# Patient Record
Sex: Female | Born: 1965
Health system: Southern US, Community
[De-identification: ages and names within clinical notes are randomized; demographics above are authoritative.]

## PROBLEM LIST (undated history)

## (undated) DIAGNOSIS — G43909 Migraine, unspecified, not intractable, without status migrainosus: Secondary | ICD-10-CM

## (undated) DIAGNOSIS — R569 Unspecified convulsions: Secondary | ICD-10-CM

## (undated) DIAGNOSIS — M542 Cervicalgia: Secondary | ICD-10-CM

## (undated) DIAGNOSIS — F329 Major depressive disorder, single episode, unspecified: Secondary | ICD-10-CM

## (undated) DIAGNOSIS — F32A Depression, unspecified: Secondary | ICD-10-CM

## (undated) HISTORY — PX: IMPLANTATION VAGAL NERVE STIMULATOR: SUR692

## (undated) HISTORY — DX: Major depressive disorder, single episode, unspecified: F32.9

## (undated) HISTORY — PX: WRIST FRACTURE SURGERY: SHX121

## (undated) HISTORY — DX: Depression, unspecified: F32.A

---

## 2011-06-19 DIAGNOSIS — G40209 Localization-related (focal) (partial) symptomatic epilepsy and epileptic syndromes with complex partial seizures, not intractable, without status epilepticus: Secondary | ICD-10-CM | POA: Diagnosis not present

## 2011-06-19 DIAGNOSIS — R413 Other amnesia: Secondary | ICD-10-CM | POA: Diagnosis not present

## 2011-06-19 DIAGNOSIS — G43019 Migraine without aura, intractable, without status migrainosus: Secondary | ICD-10-CM | POA: Diagnosis not present

## 2011-06-19 DIAGNOSIS — M62838 Other muscle spasm: Secondary | ICD-10-CM | POA: Diagnosis not present

## 2011-06-19 DIAGNOSIS — G40909 Epilepsy, unspecified, not intractable, without status epilepticus: Secondary | ICD-10-CM | POA: Diagnosis not present

## 2011-06-27 DIAGNOSIS — Z23 Encounter for immunization: Secondary | ICD-10-CM | POA: Diagnosis not present

## 2011-06-27 DIAGNOSIS — R634 Abnormal weight loss: Secondary | ICD-10-CM | POA: Diagnosis not present

## 2011-06-27 DIAGNOSIS — N912 Amenorrhea, unspecified: Secondary | ICD-10-CM | POA: Diagnosis not present

## 2011-06-27 DIAGNOSIS — R6882 Decreased libido: Secondary | ICD-10-CM | POA: Diagnosis not present

## 2011-06-27 DIAGNOSIS — Z1239 Encounter for other screening for malignant neoplasm of breast: Secondary | ICD-10-CM | POA: Diagnosis not present

## 2011-06-27 DIAGNOSIS — R630 Anorexia: Secondary | ICD-10-CM | POA: Diagnosis not present

## 2011-06-27 DIAGNOSIS — K219 Gastro-esophageal reflux disease without esophagitis: Secondary | ICD-10-CM | POA: Diagnosis not present

## 2011-06-27 DIAGNOSIS — E538 Deficiency of other specified B group vitamins: Secondary | ICD-10-CM | POA: Diagnosis not present

## 2011-07-25 DIAGNOSIS — E559 Vitamin D deficiency, unspecified: Secondary | ICD-10-CM | POA: Diagnosis not present

## 2011-07-25 DIAGNOSIS — Z0000 Encounter for general adult medical examination without abnormal findings: Secondary | ICD-10-CM | POA: Diagnosis not present

## 2011-07-25 DIAGNOSIS — Z79899 Other long term (current) drug therapy: Secondary | ICD-10-CM | POA: Diagnosis not present

## 2011-07-25 DIAGNOSIS — R634 Abnormal weight loss: Secondary | ICD-10-CM | POA: Diagnosis not present

## 2011-07-25 DIAGNOSIS — Z1231 Encounter for screening mammogram for malignant neoplasm of breast: Secondary | ICD-10-CM | POA: Diagnosis not present

## 2011-07-25 DIAGNOSIS — R87610 Atypical squamous cells of undetermined significance on cytologic smear of cervix (ASC-US): Secondary | ICD-10-CM | POA: Diagnosis not present

## 2011-07-25 DIAGNOSIS — Z01419 Encounter for gynecological examination (general) (routine) without abnormal findings: Secondary | ICD-10-CM | POA: Diagnosis not present

## 2011-11-07 DIAGNOSIS — G40909 Epilepsy, unspecified, not intractable, without status epilepticus: Secondary | ICD-10-CM | POA: Diagnosis not present

## 2011-11-07 DIAGNOSIS — G40209 Localization-related (focal) (partial) symptomatic epilepsy and epileptic syndromes with complex partial seizures, not intractable, without status epilepticus: Secondary | ICD-10-CM | POA: Diagnosis not present

## 2011-11-07 DIAGNOSIS — G43019 Migraine without aura, intractable, without status migrainosus: Secondary | ICD-10-CM | POA: Diagnosis not present

## 2011-11-07 DIAGNOSIS — R413 Other amnesia: Secondary | ICD-10-CM | POA: Diagnosis not present

## 2012-01-02 DIAGNOSIS — R413 Other amnesia: Secondary | ICD-10-CM | POA: Diagnosis not present

## 2012-01-02 DIAGNOSIS — G43019 Migraine without aura, intractable, without status migrainosus: Secondary | ICD-10-CM | POA: Diagnosis not present

## 2012-01-02 DIAGNOSIS — G40909 Epilepsy, unspecified, not intractable, without status epilepticus: Secondary | ICD-10-CM | POA: Diagnosis not present

## 2012-01-02 DIAGNOSIS — G40209 Localization-related (focal) (partial) symptomatic epilepsy and epileptic syndromes with complex partial seizures, not intractable, without status epilepticus: Secondary | ICD-10-CM | POA: Diagnosis not present

## 2012-04-07 DIAGNOSIS — R413 Other amnesia: Secondary | ICD-10-CM | POA: Diagnosis not present

## 2012-04-07 DIAGNOSIS — G43019 Migraine without aura, intractable, without status migrainosus: Secondary | ICD-10-CM | POA: Diagnosis not present

## 2012-04-07 DIAGNOSIS — G40909 Epilepsy, unspecified, not intractable, without status epilepticus: Secondary | ICD-10-CM | POA: Diagnosis not present

## 2012-04-07 DIAGNOSIS — G40209 Localization-related (focal) (partial) symptomatic epilepsy and epileptic syndromes with complex partial seizures, not intractable, without status epilepticus: Secondary | ICD-10-CM | POA: Diagnosis not present

## 2012-05-20 DIAGNOSIS — Z23 Encounter for immunization: Secondary | ICD-10-CM | POA: Diagnosis not present

## 2012-08-11 DIAGNOSIS — G40209 Localization-related (focal) (partial) symptomatic epilepsy and epileptic syndromes with complex partial seizures, not intractable, without status epilepticus: Secondary | ICD-10-CM | POA: Diagnosis not present

## 2012-08-11 DIAGNOSIS — G40919 Epilepsy, unspecified, intractable, without status epilepticus: Secondary | ICD-10-CM | POA: Diagnosis not present

## 2012-08-11 DIAGNOSIS — R413 Other amnesia: Secondary | ICD-10-CM | POA: Diagnosis not present

## 2012-08-11 DIAGNOSIS — G43019 Migraine without aura, intractable, without status migrainosus: Secondary | ICD-10-CM | POA: Diagnosis not present

## 2012-10-08 DIAGNOSIS — R51 Headache: Secondary | ICD-10-CM | POA: Diagnosis not present

## 2012-11-04 DIAGNOSIS — G43019 Migraine without aura, intractable, without status migrainosus: Secondary | ICD-10-CM | POA: Diagnosis not present

## 2012-11-04 DIAGNOSIS — G40209 Localization-related (focal) (partial) symptomatic epilepsy and epileptic syndromes with complex partial seizures, not intractable, without status epilepticus: Secondary | ICD-10-CM | POA: Diagnosis not present

## 2012-11-04 DIAGNOSIS — G40919 Epilepsy, unspecified, intractable, without status epilepticus: Secondary | ICD-10-CM | POA: Diagnosis not present

## 2012-12-14 DIAGNOSIS — D485 Neoplasm of uncertain behavior of skin: Secondary | ICD-10-CM | POA: Diagnosis not present

## 2013-01-01 DIAGNOSIS — D485 Neoplasm of uncertain behavior of skin: Secondary | ICD-10-CM | POA: Diagnosis not present

## 2013-02-04 DIAGNOSIS — G40919 Epilepsy, unspecified, intractable, without status epilepticus: Secondary | ICD-10-CM | POA: Diagnosis not present

## 2013-02-04 DIAGNOSIS — G43019 Migraine without aura, intractable, without status migrainosus: Secondary | ICD-10-CM | POA: Diagnosis not present

## 2013-02-04 DIAGNOSIS — R413 Other amnesia: Secondary | ICD-10-CM | POA: Diagnosis not present

## 2013-02-04 DIAGNOSIS — G40209 Localization-related (focal) (partial) symptomatic epilepsy and epileptic syndromes with complex partial seizures, not intractable, without status epilepticus: Secondary | ICD-10-CM | POA: Diagnosis not present

## 2013-03-02 DIAGNOSIS — G43909 Migraine, unspecified, not intractable, without status migrainosus: Secondary | ICD-10-CM | POA: Diagnosis not present

## 2013-03-02 DIAGNOSIS — R0989 Other specified symptoms and signs involving the circulatory and respiratory systems: Secondary | ICD-10-CM | POA: Diagnosis not present

## 2013-03-02 DIAGNOSIS — G40909 Epilepsy, unspecified, not intractable, without status epilepticus: Secondary | ICD-10-CM | POA: Diagnosis not present

## 2013-03-02 DIAGNOSIS — Z4682 Encounter for fitting and adjustment of non-vascular catheter: Secondary | ICD-10-CM | POA: Diagnosis not present

## 2013-03-02 DIAGNOSIS — M5382 Other specified dorsopathies, cervical region: Secondary | ICD-10-CM | POA: Diagnosis not present

## 2013-05-07 DIAGNOSIS — G40919 Epilepsy, unspecified, intractable, without status epilepticus: Secondary | ICD-10-CM | POA: Diagnosis not present

## 2013-05-07 DIAGNOSIS — G40209 Localization-related (focal) (partial) symptomatic epilepsy and epileptic syndromes with complex partial seizures, not intractable, without status epilepticus: Secondary | ICD-10-CM | POA: Diagnosis not present

## 2013-05-07 DIAGNOSIS — G43009 Migraine without aura, not intractable, without status migrainosus: Secondary | ICD-10-CM | POA: Diagnosis not present

## 2013-05-21 DIAGNOSIS — G40909 Epilepsy, unspecified, not intractable, without status epilepticus: Secondary | ICD-10-CM | POA: Diagnosis not present

## 2013-06-16 DIAGNOSIS — G40909 Epilepsy, unspecified, not intractable, without status epilepticus: Secondary | ICD-10-CM | POA: Diagnosis not present

## 2013-06-16 DIAGNOSIS — Z8262 Family history of osteoporosis: Secondary | ICD-10-CM | POA: Diagnosis not present

## 2013-06-16 DIAGNOSIS — Z833 Family history of diabetes mellitus: Secondary | ICD-10-CM | POA: Diagnosis not present

## 2013-06-16 DIAGNOSIS — Z83511 Family history of glaucoma: Secondary | ICD-10-CM | POA: Diagnosis not present

## 2013-06-16 DIAGNOSIS — Z87891 Personal history of nicotine dependence: Secondary | ICD-10-CM | POA: Diagnosis not present

## 2013-06-16 DIAGNOSIS — Z888 Allergy status to other drugs, medicaments and biological substances status: Secondary | ICD-10-CM | POA: Diagnosis not present

## 2013-06-16 DIAGNOSIS — K219 Gastro-esophageal reflux disease without esophagitis: Secondary | ICD-10-CM | POA: Diagnosis not present

## 2013-06-16 DIAGNOSIS — T85695A Other mechanical complication of other nervous system device, implant or graft, initial encounter: Secondary | ICD-10-CM | POA: Diagnosis not present

## 2013-06-16 DIAGNOSIS — R569 Unspecified convulsions: Secondary | ICD-10-CM | POA: Diagnosis not present

## 2013-06-16 DIAGNOSIS — Z809 Family history of malignant neoplasm, unspecified: Secondary | ICD-10-CM | POA: Diagnosis not present

## 2013-06-16 DIAGNOSIS — Z886 Allergy status to analgesic agent status: Secondary | ICD-10-CM | POA: Diagnosis not present

## 2013-06-16 DIAGNOSIS — Z8249 Family history of ischemic heart disease and other diseases of the circulatory system: Secondary | ICD-10-CM | POA: Diagnosis not present

## 2013-06-16 DIAGNOSIS — Z79899 Other long term (current) drug therapy: Secondary | ICD-10-CM | POA: Diagnosis not present

## 2013-06-16 DIAGNOSIS — G43909 Migraine, unspecified, not intractable, without status migrainosus: Secondary | ICD-10-CM | POA: Diagnosis not present

## 2013-07-06 DIAGNOSIS — R413 Other amnesia: Secondary | ICD-10-CM | POA: Diagnosis not present

## 2013-07-06 DIAGNOSIS — G40209 Localization-related (focal) (partial) symptomatic epilepsy and epileptic syndromes with complex partial seizures, not intractable, without status epilepticus: Secondary | ICD-10-CM | POA: Diagnosis not present

## 2013-07-06 DIAGNOSIS — G43019 Migraine without aura, intractable, without status migrainosus: Secondary | ICD-10-CM | POA: Diagnosis not present

## 2013-07-06 DIAGNOSIS — G40919 Epilepsy, unspecified, intractable, without status epilepticus: Secondary | ICD-10-CM | POA: Diagnosis not present

## 2013-10-05 DIAGNOSIS — G43019 Migraine without aura, intractable, without status migrainosus: Secondary | ICD-10-CM | POA: Diagnosis not present

## 2013-10-05 DIAGNOSIS — G40919 Epilepsy, unspecified, intractable, without status epilepticus: Secondary | ICD-10-CM | POA: Diagnosis not present

## 2013-10-05 DIAGNOSIS — G40209 Localization-related (focal) (partial) symptomatic epilepsy and epileptic syndromes with complex partial seizures, not intractable, without status epilepticus: Secondary | ICD-10-CM | POA: Diagnosis not present

## 2013-12-24 DIAGNOSIS — J0100 Acute maxillary sinusitis, unspecified: Secondary | ICD-10-CM | POA: Diagnosis not present

## 2013-12-31 DIAGNOSIS — G40209 Localization-related (focal) (partial) symptomatic epilepsy and epileptic syndromes with complex partial seizures, not intractable, without status epilepticus: Secondary | ICD-10-CM | POA: Diagnosis not present

## 2013-12-31 DIAGNOSIS — G40919 Epilepsy, unspecified, intractable, without status epilepticus: Secondary | ICD-10-CM | POA: Diagnosis not present

## 2013-12-31 DIAGNOSIS — G43019 Migraine without aura, intractable, without status migrainosus: Secondary | ICD-10-CM | POA: Diagnosis not present

## 2014-04-06 DIAGNOSIS — G40209 Localization-related (focal) (partial) symptomatic epilepsy and epileptic syndromes with complex partial seizures, not intractable, without status epilepticus: Secondary | ICD-10-CM | POA: Diagnosis not present

## 2014-04-06 DIAGNOSIS — G43019 Migraine without aura, intractable, without status migrainosus: Secondary | ICD-10-CM | POA: Diagnosis not present

## 2014-04-06 DIAGNOSIS — G40919 Epilepsy, unspecified, intractable, without status epilepticus: Secondary | ICD-10-CM | POA: Diagnosis not present

## 2014-07-22 DIAGNOSIS — Z79899 Other long term (current) drug therapy: Secondary | ICD-10-CM | POA: Diagnosis not present

## 2014-07-22 DIAGNOSIS — G40919 Epilepsy, unspecified, intractable, without status epilepticus: Secondary | ICD-10-CM | POA: Diagnosis not present

## 2014-07-22 DIAGNOSIS — M542 Cervicalgia: Secondary | ICD-10-CM | POA: Diagnosis not present

## 2014-07-22 DIAGNOSIS — R413 Other amnesia: Secondary | ICD-10-CM | POA: Diagnosis not present

## 2014-07-22 DIAGNOSIS — G43019 Migraine without aura, intractable, without status migrainosus: Secondary | ICD-10-CM | POA: Diagnosis not present

## 2014-07-22 DIAGNOSIS — G40209 Localization-related (focal) (partial) symptomatic epilepsy and epileptic syndromes with complex partial seizures, not intractable, without status epilepticus: Secondary | ICD-10-CM | POA: Diagnosis not present

## 2014-10-04 DIAGNOSIS — G43019 Migraine without aura, intractable, without status migrainosus: Secondary | ICD-10-CM | POA: Diagnosis not present

## 2014-10-14 DIAGNOSIS — R6882 Decreased libido: Secondary | ICD-10-CM | POA: Diagnosis not present

## 2014-10-14 DIAGNOSIS — G40309 Generalized idiopathic epilepsy and epileptic syndromes, not intractable, without status epilepticus: Secondary | ICD-10-CM | POA: Diagnosis not present

## 2014-11-15 DIAGNOSIS — G40209 Localization-related (focal) (partial) symptomatic epilepsy and epileptic syndromes with complex partial seizures, not intractable, without status epilepticus: Secondary | ICD-10-CM | POA: Diagnosis not present

## 2014-11-15 DIAGNOSIS — G43019 Migraine without aura, intractable, without status migrainosus: Secondary | ICD-10-CM | POA: Diagnosis not present

## 2014-11-15 DIAGNOSIS — G40919 Epilepsy, unspecified, intractable, without status epilepticus: Secondary | ICD-10-CM | POA: Diagnosis not present

## 2014-11-15 DIAGNOSIS — Z79899 Other long term (current) drug therapy: Secondary | ICD-10-CM | POA: Diagnosis not present

## 2014-12-09 DIAGNOSIS — F10129 Alcohol abuse with intoxication, unspecified: Secondary | ICD-10-CM | POA: Diagnosis not present

## 2014-12-09 DIAGNOSIS — F1210 Cannabis abuse, uncomplicated: Secondary | ICD-10-CM | POA: Insufficient documentation

## 2014-12-09 DIAGNOSIS — R45851 Suicidal ideations: Secondary | ICD-10-CM | POA: Diagnosis not present

## 2014-12-09 DIAGNOSIS — Z8669 Personal history of other diseases of the nervous system and sense organs: Secondary | ICD-10-CM | POA: Insufficient documentation

## 2014-12-09 DIAGNOSIS — R45850 Homicidal ideations: Secondary | ICD-10-CM | POA: Diagnosis not present

## 2014-12-09 DIAGNOSIS — F1110 Opioid abuse, uncomplicated: Secondary | ICD-10-CM | POA: Insufficient documentation

## 2014-12-09 DIAGNOSIS — Z8679 Personal history of other diseases of the circulatory system: Secondary | ICD-10-CM | POA: Insufficient documentation

## 2014-12-09 DIAGNOSIS — F1310 Sedative, hypnotic or anxiolytic abuse, uncomplicated: Secondary | ICD-10-CM | POA: Diagnosis not present

## 2014-12-10 ENCOUNTER — Encounter (HOSPITAL_COMMUNITY): Payer: Self-pay | Admitting: *Deleted

## 2014-12-10 ENCOUNTER — Encounter (HOSPITAL_COMMUNITY): Payer: Self-pay | Admitting: Emergency Medicine

## 2014-12-10 ENCOUNTER — Emergency Department (HOSPITAL_COMMUNITY)
Admission: EM | Admit: 2014-12-10 | Discharge: 2014-12-10 | Disposition: A | Discharge status: Other Acute Inpatient Hospital | Payer: Medicare Other | Attending: Emergency Medicine | Admitting: Emergency Medicine

## 2014-12-10 ENCOUNTER — Inpatient Hospital Stay (HOSPITAL_COMMUNITY)
Admission: AD | Admit: 2014-12-10 | Discharge: 2014-12-13 | DRG: 885 | Disposition: A | Discharge status: Home / Self Care | Payer: Medicare Other | Source: Intra-hospital | Attending: Psychiatry | Admitting: Psychiatry

## 2014-12-10 DIAGNOSIS — F419 Anxiety disorder, unspecified: Secondary | ICD-10-CM | POA: Diagnosis present

## 2014-12-10 DIAGNOSIS — F1290 Cannabis use, unspecified, uncomplicated: Secondary | ICD-10-CM | POA: Insufficient documentation

## 2014-12-10 DIAGNOSIS — F332 Major depressive disorder, recurrent severe without psychotic features: Principal | ICD-10-CM

## 2014-12-10 DIAGNOSIS — G4700 Insomnia, unspecified: Secondary | ICD-10-CM | POA: Diagnosis present

## 2014-12-10 DIAGNOSIS — R45851 Suicidal ideations: Secondary | ICD-10-CM | POA: Diagnosis present

## 2014-12-10 DIAGNOSIS — F10129 Alcohol abuse with intoxication, unspecified: Secondary | ICD-10-CM | POA: Diagnosis not present

## 2014-12-10 DIAGNOSIS — F322 Major depressive disorder, single episode, severe without psychotic features: Secondary | ICD-10-CM | POA: Diagnosis present

## 2014-12-10 DIAGNOSIS — R45850 Homicidal ideations: Secondary | ICD-10-CM

## 2014-12-10 DIAGNOSIS — F1020 Alcohol dependence, uncomplicated: Secondary | ICD-10-CM | POA: Insufficient documentation

## 2014-12-10 HISTORY — DX: Unspecified convulsions: R56.9

## 2014-12-10 HISTORY — DX: Migraine, unspecified, not intractable, without status migrainosus: G43.909

## 2014-12-10 HISTORY — DX: Cervicalgia: M54.2

## 2014-12-10 LAB — COMPREHENSIVE METABOLIC PANEL
ALT: 11 U/L — ABNORMAL LOW (ref 14–54)
ANION GAP: 10 (ref 5–15)
AST: 17 U/L (ref 15–41)
Albumin: 4 g/dL (ref 3.5–5.0)
Alkaline Phosphatase: 60 U/L (ref 38–126)
BUN: 17 mg/dL (ref 6–20)
CALCIUM: 9 mg/dL (ref 8.9–10.3)
CO2: 19 mmol/L — ABNORMAL LOW (ref 22–32)
Chloride: 113 mmol/L — ABNORMAL HIGH (ref 101–111)
Creatinine, Ser: 1.04 mg/dL — ABNORMAL HIGH (ref 0.44–1.00)
GFR calc Af Amer: >60 mL/min (ref >60)
GLUCOSE: 93 mg/dL (ref 65–99)
POTASSIUM: 3.4 mmol/L — AB (ref 3.5–5.1)
Sodium: 142 mmol/L (ref 135–145)
TOTAL PROTEIN: 6.7 g/dL (ref 6.5–8.1)
Total Bilirubin: 0.1 mg/dL — ABNORMAL LOW (ref 0.3–1.2)

## 2014-12-10 LAB — TSH: TSH: 3.619 u[IU]/mL (ref 0.350–4.500)

## 2014-12-10 LAB — CBC
HCT: 38.7 % (ref 36.0–46.0)
Hemoglobin: 12.9 g/dL (ref 12.0–15.0)
MCH: 35.4 pg — AB (ref 26.0–34.0)
MCHC: 33.3 g/dL (ref 30.0–36.0)
MCV: 106.3 fL — AB (ref 78.0–100.0)
Platelets: 239 10*3/uL (ref 150–400)
RBC: 3.64 MIL/uL — ABNORMAL LOW (ref 3.87–5.11)
RDW: 12.7 % (ref 11.5–15.5)
WBC: 5.6 10*3/uL (ref 4.0–10.5)

## 2014-12-10 LAB — ACETAMINOPHEN LEVEL: ACETAMINOPHEN (TYLENOL), SERUM: 11 ug/mL (ref 10–30)

## 2014-12-10 LAB — SALICYLATE LEVEL: Salicylate Lvl: <4 mg/dL (ref 2.8–30.0)

## 2014-12-10 LAB — RAPID URINE DRUG SCREEN, HOSP PERFORMED
Amphetamines: NOT DETECTED
Barbiturates: NOT DETECTED
Benzodiazepines: POSITIVE — AB
COCAINE: NOT DETECTED
Opiates: POSITIVE — AB
TETRAHYDROCANNABINOL: POSITIVE — AB

## 2014-12-10 LAB — VALPROIC ACID LEVEL: Valproic Acid Lvl: 35 ug/mL — ABNORMAL LOW (ref 50.0–100.0)

## 2014-12-10 LAB — ETHANOL: Alcohol, Ethyl (B): 114 mg/dL — ABNORMAL HIGH (ref <5)

## 2014-12-10 MED ORDER — DIVALPROEX SODIUM 500 MG PO DR TAB
500.0000 mg | DELAYED_RELEASE_TABLET | Freq: Two times a day (BID) | ORAL | Status: DC
  Administered 2014-12-10 – 2014-12-13 (×6): 500 mg via ORAL
  Filled 2014-12-10 (×4): qty 1
  Filled 2014-12-10 (×2): qty 6
  Filled 2014-12-10 (×6): qty 1

## 2014-12-10 MED ORDER — LOPERAMIDE HCL 2 MG PO CAPS
2.0000 mg | ORAL_CAPSULE | ORAL | Status: AC | PRN
  Administered 2014-12-10: 4 mg via ORAL
  Filled 2014-12-10: qty 2

## 2014-12-10 MED ORDER — HYDROCODONE-ACETAMINOPHEN 10-325 MG PO TABS
1.0000 | ORAL_TABLET | Freq: Four times a day (QID) | ORAL | Status: DC | PRN
  Administered 2014-12-10 – 2014-12-11 (×4): 1 via ORAL
  Filled 2014-12-10 (×4): qty 1

## 2014-12-10 MED ORDER — NONFORMULARY OR COMPOUNDED ITEM
0.5000 | Freq: Two times a day (BID) | Status: DC
  Administered 2014-12-10 – 2014-12-13 (×5): 0.5 via BUCCAL

## 2014-12-10 MED ORDER — GABAPENTIN 100 MG PO CAPS
100.0000 mg | ORAL_CAPSULE | Freq: Four times a day (QID) | ORAL | Status: DC
  Administered 2014-12-10 – 2014-12-13 (×12): 100 mg via ORAL
  Filled 2014-12-10 (×11): qty 1
  Filled 2014-12-10: qty 12
  Filled 2014-12-10: qty 1
  Filled 2014-12-10: qty 12
  Filled 2014-12-10 (×2): qty 1
  Filled 2014-12-10: qty 12
  Filled 2014-12-10 (×2): qty 1
  Filled 2014-12-10: qty 12
  Filled 2014-12-10 (×2): qty 1

## 2014-12-10 MED ORDER — PNEUMOCOCCAL VAC POLYVALENT 25 MCG/0.5ML IJ INJ
0.5000 mL | INJECTION | INTRAMUSCULAR | Status: AC
  Administered 2014-12-11: 0.5 mL via INTRAMUSCULAR

## 2014-12-10 MED ORDER — CARISOPRODOL 350 MG PO TABS: 350.0000 mg | ORAL_TABLET | Freq: Three times a day (TID) | ORAL | Status: DC | PRN

## 2014-12-10 MED ORDER — DIAZEPAM 5 MG PO TABS
10.0000 mg | ORAL_TABLET | Freq: Every day | ORAL | Status: DC
  Administered 2014-12-10 – 2014-12-12 (×3): 10 mg via ORAL
  Filled 2014-12-10 (×3): qty 2

## 2014-12-10 MED ORDER — ALUM & MAG HYDROXIDE-SIMETH 200-200-20 MG/5ML PO SUSP: 30.0000 mL | ORAL | Status: DC | PRN

## 2014-12-10 MED ORDER — TOPIRAMATE 100 MG PO TABS
200.0000 mg | ORAL_TABLET | Freq: Two times a day (BID) | ORAL | Status: DC
  Administered 2014-12-10 – 2014-12-13 (×6): 200 mg via ORAL
  Filled 2014-12-10 (×2): qty 2
  Filled 2014-12-10: qty 12
  Filled 2014-12-10 (×4): qty 2
  Filled 2014-12-10: qty 12
  Filled 2014-12-10 (×4): qty 2

## 2014-12-10 MED ORDER — CHLORDIAZEPOXIDE HCL 25 MG PO CAPS: 25.0000 mg | ORAL_CAPSULE | Freq: Four times a day (QID) | ORAL | Status: AC | PRN

## 2014-12-10 MED ORDER — METOCLOPRAMIDE HCL 10 MG PO TABS
10.0000 mg | ORAL_TABLET | Freq: Once | ORAL | Status: AC
  Administered 2014-12-10: 10 mg via ORAL
  Filled 2014-12-10: qty 1

## 2014-12-10 MED ORDER — MAGNESIUM HYDROXIDE 400 MG/5ML PO SUSP: 30.0000 mL | Freq: Every day | ORAL | Status: DC | PRN

## 2014-12-10 MED ORDER — KETOROLAC TROMETHAMINE 60 MG/2ML IM SOLN
60.0000 mg | Freq: Once | INTRAMUSCULAR | Status: AC
  Administered 2014-12-10: 60 mg via INTRAMUSCULAR
  Filled 2014-12-10: qty 2

## 2014-12-10 MED ORDER — FLUOXETINE HCL 20 MG PO CAPS
20.0000 mg | ORAL_CAPSULE | Freq: Every day | ORAL | Status: DC
  Administered 2014-12-10 – 2014-12-13 (×4): 20 mg via ORAL
  Filled 2014-12-10: qty 2
  Filled 2014-12-10: qty 3
  Filled 2014-12-10 (×6): qty 2

## 2014-12-10 MED ORDER — IBUPROFEN 800 MG PO TABS
800.0000 mg | ORAL_TABLET | Freq: Three times a day (TID) | ORAL | Status: DC | PRN
  Administered 2014-12-10 – 2014-12-13 (×3): 800 mg via ORAL
  Filled 2014-12-10 (×4): qty 1

## 2014-12-10 MED ORDER — PANTOPRAZOLE SODIUM 40 MG PO TBEC
40.0000 mg | DELAYED_RELEASE_TABLET | Freq: Every day | ORAL | Status: DC
  Administered 2014-12-10 – 2014-12-13 (×4): 40 mg via ORAL
  Filled 2014-12-10 (×8): qty 1

## 2014-12-10 MED ORDER — NONFORMULARY OR COMPOUNDED ITEM: 1.0000 | Freq: Two times a day (BID) | Status: DC
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
## 2014-12-10 NOTE — Tx Team (Signed)
Initial Interdisciplinary Treatment Plan   PATIENT STRESSORS: Health problems Marital or family conflict Substance abuse   PATIENT STRENGTHS: Average or above average intelligence Communication skills General fund of knowledge Motivation for treatment/growth Religious Affiliation Supportive family/friends   PROBLEM LIST: Problem List/Patient Goals Date to be addressed Date deferred Reason deferred Estimated date of resolution  Depression 12/10/2014     Risk for suicide 12/10/2014                                                DISCHARGE CRITERIA:  Improved stabilization in mood, thinking, and/or behavior Medical problems require only outpatient monitoring Motivation to continue treatment in a less acute level of care Need for constant or close observation no longer present Reduction of life-threatening or endangering symptoms to within safe limits Verbal commitment to aftercare and medication compliance  PRELIMINARY DISCHARGE PLAN: Return to previous living arrangement Return to previous work or school arrangements  PATIENT/FAMIILY INVOLVEMENT: This treatment plan has been presented to and reviewed with the patient, Kim [name], and/or family member, n/a.  The patient and family have been given the opportunity to ask questions and make suggestions.  Johnnye Sima, Diane C 12/10/2014, 12:01 PM
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
## 2014-12-10 NOTE — ED Provider Notes (Signed)
CSN: [id number]     Arrival date & time 12/09/14  2346 History  This chart was scribed for non-physician practitioner,working with Everlene Balls, MD, by Helane Gunther ED Scribe. This patient was seen in room A12C/A12C and the patient's care was started at 2:15 AM     Chief Complaint  Patient presents with  . Suicidal   The history is provided by the patient. No language interpreter was used.   HPI Comments: Kim [name] is a 49 y.o. female who presents to the Emergency Department complaining of suicidal ideations for the past 5 years since her father passed, worsening in the last year. She admits to thoughts of jumping out of the car on the way to the ED this evening. She notes frequent episodes of crying for several months, "eveything makes me cry." She is concerned that she may be admitted to an institution and is not forthcoming with her exact plans for self harm. She also reports homicidal ideations. She denies vomiting, diarrhea, fever, cough, rhinorrhea and change in appetite. She has not seen a psychiatrist and is not on any psychiatric medications.  She also c/o a headache. Pt has a history of migraines, notes pain today feels similar to her previous migraines.  Past Medical History  Diagnosis Date  . Migraines   . Seizures   . Neck pain    Past Surgical History  Procedure Laterality Date  . Implantation vagal nerve stimulator     No family history on file. History  Substance Use Topics  . Smoking status: Never Smoker   . Smokeless tobacco: Not on file  . Alcohol Use: Yes     Comment: daily   OB History    No data available     Review of Systems  A complete 10 system review of systems was obtained and all systems are negative except as noted in the HPI and PMH.    Allergies  Review of patient's allergies indicates no known allergies.  Home Medications   Prior to Admission medications   Not on File   BP 141/89 mmHg  Pulse 83  Temp(Src) 97.5 F (36.4 C) (Oral)   Resp 24  Ht 5\' 7"  (1.702 m)  Wt 115 lb (52.164 kg)  BMI 18.01 kg/m2  SpO2 99% Physical Exam  Constitutional: She is oriented to person, place, and time. She appears well-developed and well-nourished. No distress.  HENT:  Head: Normocephalic and atraumatic.  Nose: Nose normal.  Mouth/Throat: Oropharynx is clear and moist. No oropharyngeal exudate.  Eyes: Conjunctivae and EOM are normal. Pupils are equal, round, and reactive to light. No scleral icterus.  Neck: Normal range of motion. Neck supple. No JVD present. No tracheal deviation present. No thyromegaly present.  Cardiovascular: Normal rate, regular rhythm and normal heart sounds.  Exam reveals no gallop and no friction rub.   No murmur heard. Pulmonary/Chest: Effort normal and breath sounds normal. No respiratory distress. She has no wheezes. She exhibits no tenderness.  Abdominal: Soft. Bowel sounds are normal. She exhibits no distension and no mass. There is no tenderness. There is no rebound and no guarding.  Musculoskeletal: Normal range of motion. She exhibits no edema or tenderness.  Lymphadenopathy:    She has no cervical adenopathy.  Neurological: She is alert and oriented to person, place, and time. No cranial nerve deficit. She exhibits normal muscle tone.  Skin: Skin is warm and dry. No rash noted. No erythema. No pallor.  Psychiatric:  SI and HI  Nursing note  and vitals reviewed.   ED Course  Procedures  DIAGNOSTIC STUDIES: Oxygen Saturation is 99% on RA, normal by my interpretation.    COORDINATION OF CARE: 2:20 AM - Discussed plans to order a tele-psych consult. Pt advised of plan for treatment and pt agrees.  Labs Review Labs Reviewed  CBC - Abnormal; Notable for the following:    RBC 3.64 (*)    MCV 106.3 (*)    MCH 35.4 (*)    All other components within normal limits  COMPREHENSIVE METABOLIC PANEL - Abnormal; Notable for the following:    Potassium 3.4 (*)    Chloride 113 (*)    CO2 19 (*)     Creatinine, Ser 1.04 (*)    ALT 11 (*)    Total Bilirubin 0.1 (*)    All other components within normal limits  ETHANOL - Abnormal; Notable for the following:    Alcohol, Ethyl (B) 114 (*)    All other components within normal limits  URINE RAPID DRUG SCREEN, HOSP PERFORMED - Abnormal; Notable for the following:    Opiates POSITIVE (*)    Benzodiazepines POSITIVE (*)    Tetrahydrocannabinol POSITIVE (*)    All other components within normal limits  ACETAMINOPHEN LEVEL  SALICYLATE LEVEL    Imaging Review No results found.   EKG Interpretation None      MDM   Final diagnoses:  None   Patient presents emergency department for suicidal and homicidal ideation. She'll require psychiatric evaluation. Patient is also currently intoxicated with alcohol. She was given Toradol and Reglan for her headache which she states is a chronic migraine. TTS has been consulted.  TTS recs for inpatient criteria. She has been accepted to behavioral health. If she is not willing to sign voluntary papers, she will require IVC. I personally performed the services described in this documentation, which was scribed in my presence. The recorded information has been reviewed and is accurate.   Everlene Balls, MD 12/10/14 [phone]

## 2014-12-10 NOTE — ED Notes (Signed)
Pharmacy notified for PO reglan

## 2014-12-10 NOTE — Progress Notes (Signed)
Adult Psychoeducational Group Note  Date:  12/10/2014 Time:  8:57 PM  Group Topic/Focus:  Wrap-Up Group:   The focus of this group is to help patients review their daily goal of treatment and discuss progress on daily workbooks.  Participation Level:  Active  Participation Quality:  Appropriate  Affect:  Appropriate  Cognitive:  Appropriate  Insight: Appropriate  Engagement in Group:  Engaged  Modes of Intervention:  Discussion  Additional Comments:  The patient expressed that she attended group.The patient also said that she learned about healthy and unhealthy coping skills.  Nash Shearer 12/10/2014, 8:57 PM

## 2014-12-10 NOTE — ED Notes (Signed)
Sitter at bedside.

## 2014-12-10 NOTE — ED Notes (Signed)
Spoke with pt about signing voluntary paper work to be transferred to [facility]; pt stated that she did not want to stay and would not sign paper work; Pt started screaming at daughter that she did not tell her she had to stay; pt starts to get up as if she wanted to leave; Md notified and IVC paper work is currently in process

## 2014-12-10 NOTE — ED Notes (Signed)
Staffing office notified of need for sitter. Pt changed into purple scrubs.

## 2014-12-10 NOTE — Progress Notes (Signed)
Writer has observed patient up in the dayroom watching tv and minimal interaction with peers. Writer spoke with her 1:1 and she appears anxious during our conversation. She reports that she is glad that she is here but was mad when she came in last night. She reports that she is learning healthy coping skill and realizes that she cannot drink anymore. She is appreciative of the help that she has received since being here. She denies si/hi/a/v hallucinations. She expressed that she wants to continue to be able to see her grandchildren. Support and encouragement given, safety maintained on unit with 15 min checks.

## 2014-12-10 NOTE — ED Notes (Signed)
Awaiting IVC paperwork to be served to pt and for transport to [facility]

## 2014-12-10 NOTE — BH Assessment (Signed)
Received notification of TTS consult request. Spoke to Everlene Balls, MD who said Pt is depressed, suicidal and homicidal. Tele-assessment will be initiated.  Orpah Greek Anson Fret, [place], [facility], [facility] Triage Specialist [phone]

## 2014-12-10 NOTE — [facility] Suicide Risk Assessment (Signed)
[facility] Admission Suicide Risk Assessment   Nursing information obtained from:    Demographic factors:    Current Mental Status:    Loss Factors:    Historical Factors:    Risk Reduction Factors:    Total Time spent with patient: 1.5 hours Principal Problem: <principal problem not specified> Diagnosis:  There are no active problems to display for this patient.    Continued Clinical Symptoms:    The "Alcohol Use Disorders Identification Test", Guidelines for Use in Primary Care, Second Edition.  [employer] [facility]). Score between 0-7:  no or low risk or alcohol related problems. Score between 8-15:  moderate risk of alcohol related problems. Score between 16-19:  high risk of alcohol related problems. Score 20 or above:  warrants further diagnostic evaluation for alcohol dependence and treatment.   CLINICAL FACTORS:   Dysthymia Alcohol/Substance Abuse/Dependencies More than one psychiatric diagnosis Unstable or Poor Therapeutic Relationship   Musculoskeletal: Strength & Muscle Tone: within normal limits Gait & Station: normal Patient leans: N/A  Psychiatric Specialty Exam: Physical Exam  Nursing note and vitals reviewed. HENT:  Head: Normocephalic.  Skin: She is not diaphoretic.    Review of Systems  Constitutional: Negative.   Gastrointestinal: Negative for nausea.  Skin: Negative for rash.  Neurological: Negative for tremors and headaches.  Psychiatric/Behavioral: Positive for depression and substance abuse. The patient is nervous/anxious.     Blood pressure 115/89, pulse 76, temperature 97.7 F (36.5 C), temperature source Oral, resp. rate 16, height 5' 4.17" (1.63 m), weight 52.164 kg (115 lb), SpO2 100 %.Body mass index is 19.63 kg/(m^2).  General Appearance: Casual and Disheveled  Eye Contact::  Fair  Speech:  Slow  Volume:  Decreased  Mood:  Depressed and Dysphoric  Affect:  Congruent  Thought Process:  Coherent  Orientation:  Full (Time,  Place, and Person)  Thought Content:  Rumination  Suicidal Thoughts:  No  Homicidal Thoughts:  No  Memory:  Immediate;   Fair Recent;   Fair  Judgement:  Poor  Insight:  Shallow  Psychomotor Activity:  Decreased  Concentration:  Fair  Recall:  [place]: Fair  Akathisia:  Negative  Handed:  Right  AIMS (if indicated):     Assets:  Desire for Improvement Resilience Transportation  Sleep:     Cognition: WNL  ADL's:  Intact     COGNITIVE FEATURES THAT CONTRIBUTE TO RISK:  Closed-mindedness and Polarized thinking    SUICIDE RISK:   Moderate:  Frequent suicidal ideation with limited intensity, and duration, some specificity in terms of plans, no associated intent, good self-control, limited dysphoria/symptomatology, some risk factors present, and identifiable protective factors, including available and accessible social support.  PLAN OF CARE: inpatient stabilization and safety. Monitor alcohol withdrawals. Attend groups and medication management for depression.   Medical Decision Making:  Review of Psycho-Social Stressors (1), Review or order clinical lab tests (1), Review of Last Therapy Session (1) and Review of Medication Regimen & Side Effects (2)  I certify that inpatient services furnished can reasonably be expected to improve the patient's condition.   Damonique Brunelle 12/10/2014, 9:52 AM
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
## 2014-12-10 NOTE — ED Notes (Signed)
Pt states that she drank a fifth of [employer].

## 2014-12-10 NOTE — ED Notes (Signed)
telepsych
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
## 2014-12-11 DIAGNOSIS — F332 Major depressive disorder, recurrent severe without psychotic features: Secondary | ICD-10-CM | POA: Diagnosis not present

## 2014-12-11 LAB — LIPID PANEL
CHOL/HDL RATIO: 2 ratio
Cholesterol: 200 mg/dL (ref 0–200)
HDL: 98 mg/dL (ref >40)
LDL Cholesterol: 86 mg/dL (ref 0–99)
TRIGLYCERIDES: 82 mg/dL (ref <150)
VLDL: 16 mg/dL (ref 0–40)

## 2014-12-11 NOTE — Progress Notes (Signed)
Patient ID: Kim [name], female   DOB: [birth year], 49 y.o.   MRN: [id number] D: Patient is pleasant on approach.  She denies any depressive symptoms today.  She denies SI/HI/AVH.  Her goal today is to "get better and make myself feel better about myself."  Patient reports good sleep and appetite.  Patient received pneumonia shot and tolerated well. A: Continue to monitor medication management and MD orders. Safety checks completed every 15 minutes per protocol.  Meet 1:1 with patient to address concerns and offer encouragement. R: Patient's behavior is appropriate to situation.

## 2014-12-11 NOTE — Progress Notes (Signed)
[place] MD Progress Note  12/11/2014 2:03 PM Kim [name]  MRN:  [id number]    Subjective:  Patient states "I feel great.  I can tell that the Prozac is working already.  I can tell the difference.  I feel really good.  I slept good last night." ""I'm even telling other people you need to calm down and talk to other calmly and you might get what you need."  Objective:Patient seen face to face, chart reviewed, and discussed with staff.  Patient continues to minimize the reason why she is here not voicing the actions that caused her to be hospitalized.  Patient states that she is tolerating medication without adverse effect at this time and is attending/participating in group sessions   Principal Problem: Major depressive disorder, recurrent, severe without psychotic features Diagnosis:   Patient Active Problem List   Diagnosis Date Noted  . MDD (major depressive disorder), single episode, severe [F32.2] 12/10/2014  . Major depressive disorder, recurrent, severe without psychotic features [F33.2]   . Alcohol use disorder, moderate, dependence [F10.20]   . Marijuana use, episodic [F12.90]    Total Time spent with patient: 45 minutes   Past Medical History:  Past Medical History  Diagnosis Date  . Migraines   . Seizures   . Neck pain     Past Surgical History  Procedure Laterality Date  . Implantation vagal nerve stimulator     Family History: History reviewed. No pertinent family history. Social History:  History  Alcohol Use  . Yes    Comment: daily     History  Drug Use  . 3.00 per week  . Special: Marijuana    History   Social History  . Marital Status: Single    Spouse Name: N/A  . Number of Children: N/A  . Years of Education: N/A   Social History Main Topics  . Smoking status: Never Smoker   . Smokeless tobacco: Not on file  . Alcohol Use: Yes     Comment: daily  . Drug Use: 3.00 per week    Special: Marijuana  . Sexual Activity: Not on file   Other  Topics Concern  . None   Social History Narrative   Additional History:    Sleep: Good, States that she has to have ear plugs to sleep  Appetite:  Good   Assessment:   Musculoskeletal: Strength & Muscle Tone: within normal limits Gait & Station: normal Patient leans: N/A   Psychiatric Specialty Exam: Physical Exam  Nursing note and vitals reviewed. Constitutional: She is oriented to person, place, and time.  Neck: Normal range of motion.  Musculoskeletal: Normal range of motion.  Neurological: She is alert and oriented to person, place, and time.  Psychiatric: Her speech is normal. Her mood appears anxious. She exhibits a depressed mood.    Review of Systems  Gastrointestinal: Negative for nausea, vomiting, abdominal pain, diarrhea and constipation.  Musculoskeletal:       Chronic neck pain  Neurological: Positive for seizures and headaches. Loss of consciousness: last seizue was a couple weeks ago.       Patient states that she has not been taking her seizure medication as she should but doesn't want to let her doctor know. State that she wants to get on something for depression and that she will start taking her medications as ordered    Psychiatric/Behavioral: Positive for substance abuse (ETOH). Negative for hallucinations. Depression: Denies. Suicidal ideas: Denies. The patient is nervous/anxious. Insomnia: Denies.  Blood pressure 123/83, pulse 80, temperature 97.8 F (36.6 C), temperature source Oral, resp. rate 18, height 5' 4.17" (1.63 m), weight 52.164 kg (115 lb), SpO2 100 %.Body mass index is 19.63 kg/(m^2).  General Appearance: Casual  Eye Contact::  Good  Speech:  Blocked and Normal Rate  Volume:  Normal  Mood:  Anxious  Affect:  Congruent  Thought Process:  Irrelevant  Orientation:  Full (Time, Place, and Person)  Thought Content:  Rumination and Denies hallucinations, delusions, and paranoia  Suicidal Thoughts:  Denies.  Continues to minimize  reason in hospital"  Homicidal Thoughts:  No  Memory:  Immediate;   Good Recent;   Good Remote;   Good  Judgement:  Poor  Insight:  Lacking  Psychomotor Activity:  Normal  Concentration:  Good  Recall:  Good  Fund of Knowledge:Fair  Language: Good  Akathisia:  No  Handed:  Right  AIMS (if indicated):     Assets:  Communication Skills Housing Social Support  ADL's:  Intact  Cognition: WNL  Sleep:        Current Medications: Current Facility-Administered Medications  Medication Dose Route Frequency Provider Last Rate Last Dose  . alum & mag hydroxide-simeth (MAALOX/MYLANTA) 200-200-20 MG/5ML suspension 30 mL  30 mL Oral Q4H PRN Suprena Travaglini B Ravynn Hogate, NP      . carisoprodol (SOMA) tablet 350 mg  350 mg Oral TID PRN Camaron Cammack B Jadine Brumley, NP      . chlordiazePOXIDE (LIBRIUM) capsule 25 mg  25 mg Oral Q6H PRN Caydence Koenig B Alva Broxson, NP      . diazepam (VALIUM) tablet 10 mg  10 mg Oral QHS Thania Woodlief B Dayrin Stallone, NP   10 mg at 12/10/14 2129  . divalproex (DEPAKOTE) DR tablet 500 mg  500 mg Oral BID Angla Delahunt B Jobe Mutch, NP   500 mg at 12/11/14 0805  . FLUoxetine (PROZAC) capsule 20 mg  20 mg Oral Daily Demarrion Meiklejohn B Huel Centola, NP   20 mg at 12/11/14 0805  . gabapentin (NEURONTIN) capsule 100 mg  100 mg Oral QID Shiesha Jahn B Sameul Tagle, NP   100 mg at 12/11/14 1146  . HYDROcodone-acetaminophen (NORCO) 10-325 MG per tablet 1 tablet  1 tablet Oral Q6H PRN Evelen Vazguez B Charmel Pronovost, NP   1 tablet at 12/10/14 2149  . ibuprofen (ADVIL,MOTRIN) tablet 800 mg  800 mg Oral Q8H PRN Jefferey Lippmann B Javonna Balli, NP   800 mg at 12/10/14 2008  . loperamide (IMODIUM) capsule 2-4 mg  2-4 mg Oral PRN Jaquavius Hudler B Deboraha Goar, NP   4 mg at 12/10/14 1546  . magnesium hydroxide (MILK OF MAGNESIA) suspension 30 mL  30 mL Oral Daily PRN Charitie Hinote B Ephrata Verville, NP      . NONFORMULARY OR COMPOUNDED ITEM 0.5 lozenge  0.5 lozenge Buccal BID Jenne Campus, MD   0.5 lozenge at 12/11/14 0805  . pantoprazole (PROTONIX) EC tablet 40 mg  40 mg Oral Daily Deetya Drouillard B Tachina Spoonemore, NP   40 mg at 12/11/14  0805  . topiramate (TOPAMAX) tablet 200 mg  200 mg Oral BID Sara Selvidge B Lamae Fosco, NP   200 mg at 12/11/14 6712    Lab Results:  Results for orders placed or performed during the hospital encounter of 12/10/14 (from the past 48 hour(s))  Valproic acid level     Status: Abnormal   Collection Time: 12/10/14  7:16 PM  Result Value Ref Range   Valproic Acid Lvl 35 (L) 50.0 - 100.0 ug/mL    Comment: Performed at [facility]  Lipid panel     Status: None   Collection Time: 12/10/14  7:16 PM  Result Value Ref Range   Cholesterol 200 0 - 200 mg/dL   Triglycerides 82 <150 mg/dL   HDL 98 >40 mg/dL   Total CHOL/HDL Ratio 2.0 RATIO   VLDL 16 0 - 40 mg/dL   LDL Cholesterol 86 0 - 99 mg/dL    Comment:        Total Cholesterol/HDL:CHD Risk Coronary Heart Disease Risk Table                     Men   Women  1/2 Average Risk   3.4   3.3  Average Risk       5.0   4.4  2 X Average Risk   9.6   7.1  3 X Average Risk  23.4   11.0        Use the calculated Patient Ratio above and the CHD Risk Table to determine the patient's CHD Risk.        ATP III CLASSIFICATION (LDL):  <100     mg/dL   Optimal  100-129  mg/dL   Near or Above                    Optimal  130-159  mg/dL   Borderline  160-189  mg/dL   High  >190     mg/dL   Very High Performed at [facility]   TSH     Status: None   Collection Time: 12/10/14  7:16 PM  Result Value Ref Range   TSH 3.619 0.350 - 4.500 uIU/mL    Comment: Performed at [facility]    Physical Findings: AIMS: Facial and Oral Movements Muscles of Facial Expression: None, normal Lips and Perioral Area: None, normal Jaw: None, normal Tongue: None, normal,Extremity Movements Upper (arms, wrists, hands, fingers): None, normal Lower (legs, knees, ankles, toes): None, normal, Trunk Movements Neck, shoulders, hips: None, normal, Overall Severity Severity of abnormal movements (highest score from questions above): None,  normal Incapacitation due to abnormal movements: None, normal Patient's awareness of abnormal movements (rate only patient's report): No Awareness, Dental Status Current problems with teeth and/or dentures?: No Does patient usually wear dentures?: No  CIWA:  CIWA-Ar Total: 0 COWS:  COWS Total Score: 0  Treatment Plan Summary: Daily contact with patient to assess and evaluate symptoms and progress in treatment and Medication management  1. Admit for crisis management and stabilization 2. Medication management to reduce current symptoms to bale line and improve the patient's overall level of functioning: Continue Prozac 20 mg daily and home medications 3. Treat health problems as indicated 4. Develop treatment plan to decrease risk of relapse upon discharge and the need for readmission. 5. Psycho-social education regarding relapse prevention and self care. 6. Health care follow up as needed for medical problems 7. Restart home medications where appropriate.  Patient home medications were verified by pilled bottle fill dates and pharmacy (pain medications).  Continue current treatment plan; no changes at this time.  Medical Decision Making:  Review of Psycho-Social Stressors (1), Review of Last Therapy Session (1) and Review of Medication Regimen & Side Effects (2)   Ada Holness, FNP-BC 12/11/2014, 2:03 PM

## 2014-12-11 NOTE — Plan of Care (Signed)
Problem: Alteration in mood Goal: LTG-Patient reports reduction in suicidal thoughts (Patient reports reduction in suicidal thoughts and is able to verbalize a safety plan for whenever patient is feeling suicidal)  Outcome: Progressing Patient currently denies suicidal ideations.      
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
## 2014-12-11 NOTE — Progress Notes (Signed)
During wrap-up group when asked how pt's day was pt stated she had a great day and enjoyed being able to go outside and participate in activities such as basketball.  In addition pt stated that her 2 daughters came to visit and she enjoyed that as well.  Pt states that her goal for today was to feel better, exercise and continue to learn how to cope.  Pt states that she feels as though she accomplished that today and wants to continue once she goes home tomorrow.   Rick Duff, MHT

## 2014-12-11 NOTE — BHH Group Notes (Signed)
[place] Group Notes:  (Clinical Social Work)  12/11/2014  1:15-2:15PM  Summary of Progress/Problems:   The main focus of today's process group was to   1)  discuss the importance of adding supports  2)  define health supports versus unhealthy supports  3)  identify the patient's current unhealthy supports and plan how to handle them  4)  Identify the patient's current healthy supports and plan what to add.  An emphasis was placed on using counselor, doctor, therapy groups, 12-step groups, and problem-specific support groups to expand supports.    The patient expressed full comprehension of the concepts presented, and agreed that there is a need to add more supports.  The patient stated her daughter is a good support, and she is also planning to add exercise to her life by having her grandson's Wii hooked up and doing dance exercise.  She has a guitar she used to play and she wants to relearn it as well.  Type of Therapy:  Process Group with Motivational Interviewing  Participation Level:  Active  Participation Quality:  Attentive  Affect:  Anxious and Blunted  Cognitive:  Alert  Insight:  Improving  Engagement in Therapy:  Engaged  Modes of Intervention:   Education, Support and Processing, Activity  Selmer Dominion, LCSW 12/11/2014

## 2014-12-12 LAB — FOLATE: Folate: 6 ng/mL (ref >5.9)

## 2014-12-12 LAB — VITAMIN B12: Vitamin B-12: 322 pg/mL (ref 180–914)

## 2014-12-12 NOTE — Progress Notes (Signed)
D: Pt presents anxious on approach. Pt presented with rapid, pressured speech and flight of ideas. Pt told Probation officer that she was discharging today and plans to return home with her daughter. Pt reported that her will come and visit. Pt stated that she's not suicidal/homicidal and denies depression. Pt stated that she spoke to her daughter and plans to stop worrying about other's and focus only on herself. Pt reported to Probation officer a list of things that she plans to do to stay focus once she is discharged.. Pt compliant with taking meds and attending groups. Pt tolerating meds well and no adverse reactions to meds verbalized by pt. A: Medications administered as ordered per MD. Verbal support given. Pt encouraged to attend groups. 15 minute checks performed for safety. R: Pt receptive to treatment.

## 2014-12-12 NOTE — Progress Notes (Signed)
Patient ID: Kim [name], female   DOB: May 08, 1966, 49 y.o.   MRN: [id number] PER STATE REGULATIONS 482.30  THIS CHART WAS REVIEWED FOR MEDICAL NECESSITY WITH RESPECT TO THE PATIENT'S ADMISSION/DURATION OF STAY.  NEXT REVIEW DATE: 12/14/14  Roma Schanz, RN, BSN CASE MANAGER

## 2014-12-12 NOTE — Progress Notes (Signed)
Writer spoke with patient 1:1 and she reports having had a good day and even played some sports this afternoon outside. She had a visit by her 2 daughters this evening and reports that she is looking forward to going home on tomorrow. She plans to continue using positive coping skill and will get her Wii out and dance more once she is home. She denies si/hi/a/v hallucinations. Support and encouragement given, safety maintained on unit with 15 min checks.

## 2014-12-12 NOTE — Progress Notes (Signed)
Patient stated she thought she had a seizure in the day room after group.  She states another patient was yelling at her and she was not responding.  She denies any after affects and reported it to staff.  Was not witnessed by staff.

## 2014-12-12 NOTE — Progress Notes (Signed)
[facility] MD Progress Note  12/12/2014 8:41 PM Kim [name]  MRN:  [id number] Subjective:  Kim [name] states that she is on disability due to seizures. States she mostly stays at the house. Claims her husband accuses her of meeting other people ( men) states she cant leave the house so this accusations are very irrational. She admits she has not been able to deal with the death of her father and admits she drinks but states she shares a pint of Vodka with her husband. They both drink. Does not remember what caused the argument with her husband but things escalated. States if they would not have been drinking this would not have happened. She states she is committed to abstain on account of this event as well as her seizure disorder and the medications she takes for them Principal Problem: Major depressive disorder, recurrent, severe without psychotic features Diagnosis:   Patient Active Problem List   Diagnosis Date Noted  . MDD (major depressive disorder), single episode, severe [F32.2] 12/10/2014  . Major depressive disorder, recurrent, severe without psychotic features [F33.2]   . Alcohol use disorder, moderate, dependence [F10.20]   . Marijuana use, episodic [F12.90]    Total Time spent with patient: 30 minutes   Past Medical History:  Past Medical History  Diagnosis Date  . Migraines   . Seizures   . Neck pain     Past Surgical History  Procedure Laterality Date  . Implantation vagal nerve stimulator     Family History: History reviewed. No pertinent family history. Social History:  History  Alcohol Use  . Yes    Comment: daily     History  Drug Use  . 3.00 per week  . Special: Marijuana    History   Social History  . Marital Status: Single    Spouse Name: N/A  . Number of Children: N/A  . Years of Education: N/A   Social History Main Topics  . Smoking status: Never Smoker   . Smokeless tobacco: Not on file  . Alcohol Use: Yes     Comment: daily  . Drug Use: 3.00 per  week    Special: Marijuana  . Sexual Activity: Not on file   Other Topics Concern  . None   Social History Narrative   Additional History:    Sleep: Fair  Appetite:  Fair   Assessment:   Musculoskeletal: Strength & Muscle Tone: within normal limits Gait & Station: normal Patient leans: normal   Psychiatric Specialty Exam: Physical Exam  Review of Systems  Constitutional: Negative.   HENT: Negative.   Eyes: Negative.   Respiratory: Negative.   Cardiovascular: Negative.   Gastrointestinal: Negative.   Genitourinary: Negative.   Musculoskeletal: Negative.   Skin: Negative.   Neurological: Positive for seizures.  Psychiatric/Behavioral: Positive for depression and substance abuse. The patient is nervous/anxious.     Blood pressure 132/93, pulse 90, temperature 97.6 F (36.4 C), temperature source Oral, resp. rate 16, height 5' 4.17" (1.63 m), weight 52.164 kg (115 lb), SpO2 100 %.Body mass index is 19.63 kg/(m^2).  General Appearance: Fairly Groomed  Engineer, water::  Fair  Speech:  Clear and Coherent  Volume:  Decreased  Mood:  Anxious, Depressed and worried  Affect:  anxious sad worried  Thought Process:  Coherent and Goal Directed  Orientation:  Full (Time, Place, and Person)  Thought Content:  symptoms events worries concerns  Suicidal Thoughts:  No  Homicidal Thoughts:  No  Memory:  Immediate;   Fair Recent;  Fair Remote;   Fair  Judgement:  Fair  Insight:  Present  Psychomotor Activity:  Normal  Concentration:  Fair  Recall:  Poor  Fund of Knowledge:Fair  Language: Fair  Akathisia:  No  Handed:  Right  AIMS (if indicated):     Assets:  Desire for Improvement Housing  ADL's:  Intact  Cognition: WNL  Sleep:        Current Medications: Current Facility-Administered Medications  Medication Dose Route Frequency Provider Last Rate Last Dose  . alum & mag hydroxide-simeth (MAALOX/MYLANTA) 200-200-20 MG/5ML suspension 30 mL  30 mL Oral Q4H PRN  Shuvon B Rankin, NP      . carisoprodol (SOMA) tablet 350 mg  350 mg Oral TID PRN Shuvon B Rankin, NP      . chlordiazePOXIDE (LIBRIUM) capsule 25 mg  25 mg Oral Q6H PRN Shuvon B Rankin, NP      . diazepam (VALIUM) tablet 10 mg  10 mg Oral QHS Shuvon B Rankin, NP   10 mg at 12/11/14 2104  . divalproex (DEPAKOTE) DR tablet 500 mg  500 mg Oral BID Shuvon B Rankin, NP   500 mg at 12/12/14 1644  . FLUoxetine (PROZAC) capsule 20 mg  20 mg Oral Daily Shuvon B Rankin, NP   20 mg at 12/12/14 0808  . gabapentin (NEURONTIN) capsule 100 mg  100 mg Oral QID Shuvon B Rankin, NP   100 mg at 12/12/14 1644  . HYDROcodone-acetaminophen (NORCO) 10-325 MG per tablet 1 tablet  1 tablet Oral Q6H PRN Shuvon B Rankin, NP   1 tablet at 12/11/14 2215  . ibuprofen (ADVIL,MOTRIN) tablet 800 mg  800 mg Oral Q8H PRN Shuvon B Rankin, NP   800 mg at 12/12/14 1040  . loperamide (IMODIUM) capsule 2-4 mg  2-4 mg Oral PRN Shuvon B Rankin, NP   4 mg at 12/10/14 1546  . magnesium hydroxide (MILK OF MAGNESIA) suspension 30 mL  30 mL Oral Daily PRN Shuvon B Rankin, NP      . NONFORMULARY OR COMPOUNDED ITEM 0.5 lozenge  0.5 lozenge Buccal BID Jenne Campus, MD   0.5 lozenge at 12/12/14 1648  . pantoprazole (PROTONIX) EC tablet 40 mg  40 mg Oral Daily Shuvon B Rankin, NP   40 mg at 12/12/14 0808  . topiramate (TOPAMAX) tablet 200 mg  200 mg Oral BID Shuvon B Rankin, NP   200 mg at 12/12/14 1644    Lab Results: No results found for this or any previous visit (from the past 48 hour(s)).  Physical Findings: AIMS: Facial and Oral Movements Muscles of Facial Expression: None, normal Lips and Perioral Area: None, normal Jaw: None, normal Tongue: None, normal,Extremity Movements Upper (arms, wrists, hands, fingers): None, normal Lower (legs, knees, ankles, toes): None, normal, Trunk Movements Neck, shoulders, hips: None, normal, Overall Severity Severity of abnormal movements (highest score from questions above): None,  normal Incapacitation due to abnormal movements: None, normal Patient's awareness of abnormal movements (rate only patient's report): No Awareness, Dental Status Current problems with teeth and/or dentures?: No Does patient usually wear dentures?: No  CIWA:  CIWA-Ar Total: 0 COWS:  COWS Total Score: 0  Treatment Plan Summary: Daily contact with patient to assess and evaluate symptoms and progress in treatment and Medication management Supportive approach/coping skill Alcohol abuse; work on a relapse prevention plan Depression; will continue the Prozac and increase to 20 mg daily Seizure Disorder ;will continue the Depakote/Topamax/Neurontin Use of Valium; will discuss the additive effect of  alcohol and benzodiazapines, possible paradoxical reactions CBT/mindfulness  Medical Decision Making:  Review of Psycho-Social Stressors (1), Review of Medication Regimen & Side Effects (2) and Review of New Medication or Change in Dosage (2)     Heriberto Stmartin A 12/12/2014, 8:41 PM

## 2014-12-12 NOTE — [facility] Suicide Risk Assessment (Signed)
[facility] INPATIENT:  Family/Significant Other Suicide Prevention Education  Suicide Prevention Education:  Education Completed; Daisy Lites, Daughter, [phone];  has been identified by the patient as the family member/significant other with whom the patient will be residing, and identified as the person(s) who will aid the patient in the event of a mental health crisis (suicidal ideations/suicide attempt).  With written consent from the patient, the family member/significant other has been provided the following suicide prevention education, prior to the and/or following the discharge of the patient.  The suicide prevention education provided includes the following:  Suicide risk factors  Suicide prevention and interventions  National Suicide Hotline telephone number  [facility] assessment telephone number  [facility] Emergency Assistance [place] and/or Residential Mobile Crisis Unit telephone number  Request made of family/significant other to:  Remove weapons (e.g., guns, rifles, knives), all items previously/currently identified as safety concern.   Daughter advised patient does not have access to weapons.     Remove drugs/medications (over-the-counter, prescriptions, illicit drugs), all items previously/currently identified as a safety concern.  The family member/significant other verbalizes understanding of the suicide prevention education information provided.  The family member/significant other agrees to remove the items of safety concern listed above.  Concha Pyo 12/12/2014, 3:08 PM

## 2014-12-12 NOTE — [facility] Group Notes (Signed)
[facility] LCSW Aftercare Discharge Planning Group Note   12/12/2014 9:55 AM    Participation Quality:  Appropraite  Mood/Affect:  Appropriate  Depression Rating:  0  Anxiety Rating:  0  Thoughts of Suicide:  No  Will you contract for safety?   NA  Current AVH:  No  Plan for Discharge/Comments:  Patient attended discharge planning group and actively participated in group. She reports feeling much better today and hopes to discharge soon.  She will follow up with [facility].  Suicide prevention education reviewed and SPE document provided.   Transportation Means: Patient has transportation.   Supports:  Patient has a support system.   Aniruddh Ciavarella, Eulas Post

## 2014-12-12 NOTE — Progress Notes (Signed)
[facility] Group Notes:  (Nursing/MHT/Case Management/Adjunct)  Date:  12/12/2014  Time:  8:58 PM  Type of Therapy:  Psychoeducational Skills  Participation Level:  Active  Participation Quality:  Appropriate  Affect:  Appropriate  Cognitive:  Appropriate  Insight:  Appropriate  Engagement in Group:  Engaged  Modes of Intervention:  Discussion  Summary of Progress/Problems: Tonight in wrap up group Kim [name] said that today started a bit lower for her because she believed she had a seizure. But that it was a pretty good day overall.  [name] 12/12/2014, 8:58 PM

## 2014-12-12 NOTE — Progress Notes (Signed)
Recreation Therapy Notes  Date: 06.27.16 Time: 9:30 am Location: [address] Dayroom  Group Topic: Stress Management  Goal Area(s) Addresses:  Patient will verbalize importance of using healthy stress management.  Patient will identify positive emotions associated with healthy stress management.   Intervention: Stress Management  Activity :  Guided Automotive engineer.  LRT introduced and educated patients on stress management technique of guided imagery.  A script was used to to deliver the technique to patients.  Patients were asked to follow script read aloud by LRT to engage in the stress management technique.  Education:  Stress Management, Discharge Planning.   Clinical Observations/Feedback: Patient did not attend group.  Victorino Sparrow , LRT/CTRS         Victorino Sparrow A 12/12/2014 5:08 PM

## 2014-12-12 NOTE — [facility] Group Notes (Signed)
[facility] LCSW Group Therapy          Overcoming Obstacles       1:15 -2:30        12/12/2014       Type of Therapy:  Group Therapy  Participation Level:  Appropriate  Participation Quality:  Appropriate  Affect:  Appropriate, Alert  Cognitive:  Attentive Appropriate  Insight: Developing/Improving Engaged  Engagement in Therapy: Developing/Imprvoing Engaged  Modes of Intervention:  Discussion Exploration  Education Rapport BuildingProblem-Solving Support  Summary of Progress/Problems:  The main focus of today's group was overcoming obstacles.   She advised the obstacle she has to overcome is feelings of worthlessness.  She shared she is fortunate to have a supportive family but has never felt worthy of their love.   Kim [name] 12/12/2014

## 2014-12-13 MED ORDER — TOPIRAMATE 200 MG PO TABS: 200.0000 mg | ORAL_TABLET | Freq: Two times a day (BID) | ORAL | Status: DC

## 2014-12-13 MED ORDER — GABAPENTIN 100 MG PO CAPS: 100.0000 mg | ORAL_CAPSULE | Freq: Four times a day (QID) | ORAL | Status: DC

## 2014-12-13 MED ORDER — NEXIUM 40 MG PO CPDR: 40.0000 mg | DELAYED_RELEASE_CAPSULE | Freq: Two times a day (BID) | ORAL | Status: DC

## 2014-12-13 MED ORDER — DIAZEPAM 10 MG PO TABS: 10.0000 mg | ORAL_TABLET | Freq: Every day | ORAL | Status: DC

## 2014-12-13 MED ORDER — DIVALPROEX SODIUM 500 MG PO DR TAB: 500.0000 mg | DELAYED_RELEASE_TABLET | Freq: Two times a day (BID) | ORAL | Status: DC

## 2014-12-13 MED ORDER — CARISOPRODOL 350 MG PO TABS: 350.0000 mg | ORAL_TABLET | Freq: Three times a day (TID) | ORAL | Status: DC | PRN

## 2014-12-13 MED ORDER — HYDROCODONE-ACETAMINOPHEN 10-325 MG PO TABS: 1.0000 | ORAL_TABLET | Freq: Four times a day (QID) | ORAL | Status: DC | PRN

## 2014-12-13 MED ORDER — IBUPROFEN 800 MG PO TABS: 800.0000 mg | ORAL_TABLET | Freq: Three times a day (TID) | ORAL | Status: DC | PRN

## 2014-12-13 MED ORDER — DIAZEPAM 5 MG PO TABS: 5.0000 mg | ORAL_TABLET | Freq: Every day | ORAL | Status: DC

## 2014-12-13 MED ORDER — FLUOXETINE HCL 20 MG PO CAPS: 20.0000 mg | ORAL_CAPSULE | Freq: Every day | ORAL | Status: DC

## 2014-12-13 NOTE — Discharge Summary (Signed)
Physician Discharge Summary Note  Patient:  Kim [name] is an 49 y.o., female MRN:  [id number] DOB:  Jun 28, 1965 Patient phone:  [phone] (home)  Patient address:   [place] [address],  Total Time spent with patient: Greater than 30 minutes  Date of Admission:  12/10/2014  Date of Discharge: 12-13-14  Reason for Admission: Worsening symptoms of depression, alcohol use disorder  Principal Problem: Major depressive disorder, recurrent, severe without psychotic features  Discharge Diagnoses: Patient Active Problem List   Diagnosis Date Noted  . MDD (major depressive disorder), single episode, severe [F32.2] 12/10/2014  . Major depressive disorder, recurrent, severe without psychotic features [F33.2]   . Alcohol use disorder, moderate, dependence [F10.20]   . Marijuana use, episodic [F12.90]    Musculoskeletal: Strength & Muscle Tone: within normal limits Gait & Station: normal Patient leans: N/A  Psychiatric Specialty Exam: Physical Exam  Psychiatric: Her speech is normal and behavior is normal. Judgment and thought content normal. Her mood appears not anxious. Her affect is not angry, not blunt, not labile and not inappropriate. Cognition and memory are normal. She does not exhibit a depressed mood.    Review of Systems  Constitutional: Negative.   HENT: Negative.   Eyes: Negative.   Respiratory: Negative.   Cardiovascular: Negative.   Gastrointestinal: Negative.   Genitourinary: Negative.   Musculoskeletal: Negative.   Skin: Negative.   Neurological: Negative.   Endo/Heme/Allergies: Negative.   Psychiatric/Behavioral: Positive for depression (Stable) and substance abuse (Alcoholism, chronic). Negative for suicidal ideas, hallucinations and memory loss. The patient has insomnia (Stable). The patient is not nervous/anxious.     Blood pressure 121/86, pulse 85, temperature 98.3 F (36.8 C), temperature source Oral, resp. rate 16, height 5' 4.17" (1.63 m),  weight 52.164 kg (115 lb), SpO2 100 %.Body mass index is 19.63 kg/(m^2).  See Md's SRA   Have you used any form of tobacco in the last 30 days? (Cigarettes, Smokeless Tobacco, Cigars, and/or Pipes): No  Has this patient used any form of tobacco in the last 30 days? (Cigarettes, Smokeless Tobacco, Cigars, and/or Pipes) No  Past Medical History:  Past Medical History  Diagnosis Date  . Migraines   . Seizures   . Neck pain     Past Surgical History  Procedure Laterality Date  . Implantation vagal nerve stimulator     Family History: History reviewed. No pertinent family history.  Social History:  History  Alcohol Use  . Yes    Comment: daily     History  Drug Use  . 3.00 per week  . Special: Marijuana    History   Social History  . Marital Status: Single    Spouse Name: N/A  . Number of Children: N/A  . Years of Education: N/A   Social History Main Topics  . Smoking status: Never Smoker   . Smokeless tobacco: Not on file  . Alcohol Use: Yes     Comment: daily  . Drug Use: 3.00 per week    Special: Marijuana  . Sexual Activity: Not on file   Other Topics Concern  . None   Social History Narrative   Risk to Self: Is patient at risk for suicide?: Yes What has been your use of drugs/alcohol within the last 12 months?: Alcohol & marijuana, states will not use again because daughter states that grandson will not be allowed to come back if she drinks alcohol or somkes marijuana Risk to Others: No Prior Inpatient Therapy: No  Prior Outpatient Therapy: No  Level of Care:  OP  Hospital Course: "Me and my husband go into a argument and I know I have been drinking. I told him if you don't want, I take care of me; I kill myself; he called my daughter telling her that I was threatening to kill myself. He knew I would kill Myself. My father has died and I know if I kill myself I won't be able to join him. My daughter came and told me that I need to go get some help. She  said that I was depressed; she said she knew that I wouldn't kill my self but something was wrong because I was depressed." States that her daughter also told her that if anymore alcohol was in the house that her grandson would not be coming back. States that "I only drink about only three time out of the week (we share a pint) it not just me it's him to (referring to her husband)" "I do smoke a joint. It'll take me about one week to smoke a whole joint."  After admission assessment/evaluation, Kim [name] presenting symptoms were identified. Medication management was initiated targeting those symptoms. Her  medication regimen included; Valium 5 mg for severe anxiety, Depakote DR 500 mg for mood stabilization, Fluoxetine 20 mg for depression, gabapentin 100 mg for agitation & Topamax 200 mg for mood stabilization. She was resumed on all her pertinent home medications for her other medical issues that she presented. She tolerated her treatment regimen without any adverse effects reported. Kim [name] was also enrolled & participated in the group counseling sessions being offered & held on this unit. She learned coping skills that should help her cope better & manage her symptoms to maintain mood stability.  Kim [name]'s symptoms responded well to his treatment regimen. This is evidenced by his reports of improved mood & absence of suicidal ideations. She is currently being discharged to continue psychiatric care on outpatient basis as noted below. Kim [name] is provided with a 3 days worth, supply samples of his [facility] discharge medications. He left [facility] with all personal belongings in distress. Transportation per patient arrangement.  Consults:  psychiatry  Significant Diagnostic Studies:  labs: CBC with diff, CMP, UDS, toxicology tests, U/A, results reviewed, stable  Discharge Vitals:   Blood pressure 121/86, pulse 85, temperature 98.3 F (36.8 C), temperature source Oral, resp. rate 16, height 5' 4.17" (1.63 m), weight  52.164 kg (115 lb), SpO2 100 %. Body mass index is 19.63 kg/(m^2). Lab Results:   Results for orders placed or performed during the hospital encounter of 12/10/14 (from the past 72 hour(s))  Valproic acid level     Status: Abnormal   Collection Time: 12/10/14  7:16 PM  Result Value Ref Range   Valproic Acid Lvl 35 (L) 50.0 - 100.0 ug/mL    Comment: Performed at [facility]  Lipid panel     Status: None   Collection Time: 12/10/14  7:16 PM  Result Value Ref Range   Cholesterol 200 0 - 200 mg/dL   Triglycerides 82 <150 mg/dL   HDL 98 >40 mg/dL   Total CHOL/HDL Ratio 2.0 RATIO   VLDL 16 0 - 40 mg/dL   LDL Cholesterol 86 0 - 99 mg/dL    Comment:        Total Cholesterol/HDL:CHD Risk Coronary Heart Disease Risk Table                     Men  Women  1/2 Average Risk   3.4   3.3  Average Risk       5.0   4.4  2 X Average Risk   9.6   7.1  3 X Average Risk  23.4   11.0        Use the calculated Patient Ratio above and the CHD Risk Table to determine the patient's CHD Risk.        ATP III CLASSIFICATION (LDL):  <100     mg/dL   Optimal  100-129  mg/dL   Near or Above                    Optimal  130-159  mg/dL   Borderline  160-189  mg/dL   High  >190     mg/dL   Very High Performed at [facility]   TSH     Status: None   Collection Time: 12/10/14  7:16 PM  Result Value Ref Range   TSH 3.619 0.350 - 4.500 uIU/mL    Comment: Performed at [facility]  Vitamin B12     Status: None   Collection Time: 12/12/14  7:05 PM  Result Value Ref Range   Vitamin B-12 322 180 - 914 pg/mL    Comment: (NOTE) This assay is not validated for testing neonatal or myeloproliferative syndrome specimens for Vitamin B12 levels. Performed at [facility]   Folate     Status: None   Collection Time: 12/12/14  7:05 PM  Result Value Ref Range   Folate 6.0 >5.9 ng/mL    Comment: Performed at [facility]   Physical Findings: AIMS:  Facial and Oral Movements Muscles of Facial Expression: None, normal Lips and Perioral Area: None, normal Jaw: None, normal Tongue: None, normal,Extremity Movements Upper (arms, wrists, hands, fingers): None, normal Lower (legs, knees, ankles, toes): None, normal, Trunk Movements Neck, shoulders, hips: None, normal, Overall Severity Severity of abnormal movements (highest score from questions above): None, normal Incapacitation due to abnormal movements: None, normal Patient's awareness of abnormal movements (rate only patient's report): No Awareness, Dental Status Current problems with teeth and/or dentures?: No Does patient usually wear dentures?: No  CIWA:  CIWA-Ar Total: 0 COWS:  COWS Total Score: 0  See Psychiatric Specialty Exam and Suicide Risk Assessment completed by Attending Physician prior to discharge.  Discharge destination:  Home  Is patient on multiple antipsychotic therapies at discharge:  No   Has Patient had three or more failed trials of antipsychotic monotherapy by history:  No  Recommended Plan for Multiple Antipsychotic Therapies: NA    Medication List    STOP taking these medications        LOZENGES MT      TAKE these medications      Indication   carisoprodol 350 MG tablet  Commonly known as:  SOMA  Take 1 tablet (350 mg total) by mouth 3 (three) times daily as needed for muscle spasms.   Indication:  Musculoskeletal Pain     diazepam 10 MG tablet  Commonly known as:  VALIUM  Take 1 tablet (10 mg total) by mouth at bedtime. For severe anxiety   Indication:  Anxiety     divalproex 500 MG DR tablet  Commonly known as:  DEPAKOTE  Take 1 tablet (500 mg total) by mouth 2 (two) times daily. For mood stabilization/seizure   Indication:  Seizure/mood stabilization     FLUoxetine 20 MG capsule  Commonly known  as:  PROZAC  Take 1 capsule (20 mg total) by mouth daily. For depression   Indication:  Depression     gabapentin 100 MG capsule  Commonly  known as:  NEURONTIN  Take 1 capsule (100 mg total) by mouth 4 (four) times daily. For pain in neck down shoulder   Indication:  Pain management     HYDROcodone-acetaminophen 10-325 MG per tablet  Commonly known as:  NORCO  Take 1 tablet by mouth every 6 (six) hours as needed for moderate pain.   Indication:  Moderate to Moderately Severe Pain     ibuprofen 800 MG tablet  Commonly known as:  ADVIL,MOTRIN  Take 1 tablet (800 mg total) by mouth every 8 (eight) hours as needed for headache or mild pain.   Indication:  Pain management     NEXIUM 40 MG capsule  Generic drug:  esomeprazole  Take 1 capsule (40 mg total) by mouth 2 (two) times daily. For acid reflux   Indication:  Gastroesophageal Reflux Disease with Current Symptoms     topiramate 200 MG tablet  Commonly known as:  TOPAMAX  Take 1 tablet (200 mg total) by mouth 2 (two) times daily. For headaches/mood stabilization   Indication:  Seizure/mood stabilization       Follow-up Information    Follow up with [facility] On 12/15/2014.   Why:  You are scheduled with Dian Situ, Thursday, December 15, 2014 at 9:45 AM   Contact information:   [address] [place], [place]   [address]  [phone]     Follow-up recommendations: Activity:  As tolerated Diet: As recommended by your primary care doctor. Keep all scheduled follow-up appointments as recommended.  Comments: Take all your medications as prescribed by your mental healthcare provider. Report any adverse effects and or reactions from your medicines to your outpatient provider promptly. Patient is instructed and cautioned to not engage in alcohol and or illegal drug use while on prescription medicines. In the event of worsening symptoms, patient is instructed to call the crisis hotline, 911 and or go to the nearest ED for appropriate evaluation and treatment of symptoms. Follow-up with your primary care provider for your other medical issues, concerns and or health care  needs.   Total Discharge Time: Greater than 30 minutes  Signed: Encarnacion Slates, PMHNP, FNP-BC 12/13/2014, 4:12 PM   Patient seen, Suicide Assessment Completed.  Disposition Plan Reviewed

## 2014-12-13 NOTE — [facility] Group Notes (Signed)
Adult Psychoeducational Group Note  Date:  12/13/2014 Time:  0900am  Group Topic/Focus:  Goals Group:   The focus of this group is to help patients establish daily goals to achieve during treatment and discuss how the patient can incorporate goal setting into their daily lives to aide in recovery. Orientation:   The focus of this group is to educate the patient on the purpose and policies of crisis stabilization and provide a format to answer questions about their admission.  The group details unit policies and expectations of patients while admitted.  Participation Level:  Active  Participation Quality:  Appropriate and Attentive  Affect:  Appropriate  Cognitive:  Alert and Appropriate  Insight: Appropriate and Good  Engagement in Group:  Engaged  Modes of Intervention:  Discussion, Education and Orientation   Wynn Banker 12/13/2014, 9:36 AM

## 2014-12-13 NOTE — [facility] Suicide Risk Assessment (Signed)
[facility] Discharge Suicide Risk Assessment   Demographic Factors:  49 year female , married, lives with husband, on disability  Total Time spent with patient: 30 minutes  Musculoskeletal: Strength & Muscle Tone: within normal limits Gait & Station: normal Patient leans: N/A  Psychiatric Specialty Exam: Physical Exam  ROS  Blood pressure 121/86, pulse 85, temperature 98.3 F (36.8 C), temperature source Oral, resp. rate 16, height 5' 4.17" (1.63 m), weight 115 lb (52.164 kg), SpO2 100 %.Body mass index is 19.63 kg/(m^2).  General Appearance: improved grooming   Eye Contact::  Good  Speech:  Normal   Volume:  Normal  Mood:  Euthymic  Affect:  Appropriate  Thought Process:  Linear  Orientation:  Full (Time, Place, and Person)  Thought Content:  denies hallucinations, no delusions  Suicidal Thoughts:  No  Homicidal Thoughts:  No  Memory:  recent and remote grossly intact   Judgement:  Fair  Insight:  improved   Psychomotor Activity:  Normal  Concentration:  Good  Recall:  Good  Fund of Knowledge:NA  Language: Good  Akathisia:  Negative  Handed:  Right  AIMS (if indicated):     Assets:  Communication Skills Desire for Improvement Resilience Social Support  Sleep:     Cognition: WNL  ADL's:improved   Have you used any form of tobacco in the last 30 days? (Cigarettes, Smokeless Tobacco, Cigars, and/or Pipes): No  Has this patient used any form of tobacco in the last 30 days? (Cigarettes, Smokeless Tobacco, Cigars, and/or Pipes) No  Mental Status Per Nursing Assessment::   On Admission:  Suicidal ideation indicated by others, Suicidal ideation indicated by patient, Self-harm thoughts, Suicide plan, Plan includes specific time, place, or method  Current Mental Status by Physician: At this time patient is improved compared to admission- she is denying depression, denying any significant anxiety, and states she is feeling better, no thought disorder, denies any suicidal  ideations, denies any homicidal ideations, no psychotic symptoms, future oriented .  Loss Factors: Recent argument with husband, disability, history of seizures   Historical Factors: Denies any prior psychiatric admissions , history of suicide thoughts but no suicide attempts in the past .  Risk Reduction Factors:   Responsible for children under 49 years of age, Sense of responsibility to family, Living with another person, especially a relative, Positive coping skills or problem solving skills and states she is very close to her grandchildren, and  they help her feel more positive and enjoys watching them grow and mature .  Continued Clinical Symptoms:  At this time patient reports improvement and denies any current SI or depression. Of note, with patient's express consent I spoke with daughter [name]- she states that patient seems improved compared to admission and is in agreement with discharge today . She is concerned  That patient has been on " too many medications that are sedating", and is wanting to go with her to her PCP's appointments in order to support/insure that medication dosages  are decreased. Patient agrees to this.  Staff  Also has reported that patient appears intermittently sedated At this time patient is fully alert and attentive, but does acknowledge intermittent sleepiness , sedation.  we have discussed medication issues- she denies any  Abuse/misuse of Soma, and states she takes it only occasionally- agrees to D/C this medication.She also agrees to tapering  Valium dose  Down.  She takes Vallum as muscle relaxant, not for seizure disorder . Will Decrease dose to 5 mgrs QDAY .  Daughter who is an Therapist, sports, states that she plans to maintain medication for patient and to hold it if she appears sedated. We discussed the importance of abstinence from alcohol and  illicit drugs . We reviewed the potential for excessive sedation on opiate and BZD combination. (Patient has been on this  combination for several years. ) Patient states she plans to abstain from alcohol , cannabis completely    Cognitive Features That Contribute To Risk:  No gross cognitive deficits noted upon discharge. Is alert , attentive, and oriented x 3    Suicide Risk:  Mild:  Suicidal ideation of limited frequency, intensity, duration, and specificity.  There are no identifiable plans, no associated intent, mild dysphoria and related symptoms, good self-control (both objective and subjective assessment), few other risk factors, and identifiable protective factors, including available and accessible social support.  Principal Problem: Major depressive disorder, recurrent, severe without psychotic features Discharge Diagnoses:  Patient Active Problem List   Diagnosis Date Noted  . MDD (major depressive disorder), single episode, severe [F32.2] 12/10/2014  . Major depressive disorder, recurrent, severe without psychotic features [F33.2]   . Alcohol use disorder, moderate, dependence [F10.20]   . Marijuana use, episodic [F12.90]     Follow-up Information    Follow up with [facility] On 12/15/2014.   Why:  You are scheduled with Dian Situ, Thursday, December 15, 2014 at 9:45 AM   Contact information:   [address] [place], [place]   [address]  [phone]      Plan Of Care/Follow-up recommendations:  Activity:  as tolerated Diet:  Regular Tests:  NA Other:  See below  Is patient on multiple antipsychotic therapies at discharge:  No   Has Patient had three or more failed trials of antipsychotic monotherapy by history:  No  Recommended Plan for Multiple Antipsychotic Therapies: NA  Patient leaving unit in good spirits. Plans to return home- daughter , who is RN, plans to pick her up later today, and as noted, plans to monitor/maintain medications for patient. Plans to follow up with Dr. Carrie Mew ( Outpatient MD ) for medical issues, management of Seizure Disorder .    Kim [name],  Kim [name] 12/13/2014, 2:41 PM

## 2014-12-13 NOTE — Tx Team (Signed)
Interdisciplinary Treatment Plan Update (Adult)  Date:  12/13/2014  Time Reviewed:  8:45 AM   Progress in Treatment: Attending groups: Patient is attending groups. Participating in groups:  Patient engages in discussion Taking medication as prescribed:  Patient is taking medications Tolerating medication:  Patient is tolerating medications Family/Significant othe contact made:   Yes, collateral contact with daughter. Patient understands diagnosis:Yes, patient understands diagnosis and need for treatment Discussing patient identified problems/goals with staff:  Yes, patient is able to express goals/problems Medical problems stabilized or resolved:  Yes Denies suicidal/homicidal ideation: Yes, patient is denying SI/HI. Issues/concerns per patient self-inventory:   Other:  Discharge Plan or Barriers:  Home with outpatient follow up with [facility]   Reason for Continuation of Hospitalization:  Comments:  Additional comments:  Patient and CSW reviewed Patient Discharge Process Letter/Patient Involvement Form.  Patient verbalized understanding and signed form.  Patient and CSW also reviewed and identified patient's goals and treatment plan.  Patient verbalized understanding and agreed to plan.  Estimated length of stay:  Discharge today  New goal(s):  Review of initial/current patient goals per problem list:  Please see plan of careInterdisciplinary Treatment Plan Update (Adult)  Attendees: Patient 12/13/2014 8:45 AM   Family:   12/13/2014 8:45 AM   Physician:  Neita Garnet, MD 12/13/2014 8:45 AM   Nursing:   Marcella Dubs, RN 12/13/2014 8:45 AM   Clinical Social Worker:  Joette Catching, LCSW 12/13/2014 8:45 AM   Clinical Social Worker:  Erasmo Downer Drinkard, LCSW-A 12/13/2014 8:45 AM   Case Manager:  Lars Pinks, RN 12/13/2014 8:45 AM   Other:  Darrol Angel, RN 12/13/2014 8:45 AM  Other:   12/13/2014  8:45 AM   Other:  12/13/2014 8:45 AM   Other:  12/13/2014 8:45 AM   Other:   12/13/2014 8:45 AM   Other:  Jake Bathe Transition Team Coordinator 12/13/2014 8:45 AM   Other:   12/13/2014 8:45 AM   Other:  12/13/2014 8:45 AM   Other:   12/13/2014 8:45 AM    Scribe for Treatment Team:   Concha Pyo, 12/13/2014   8:45 AM

## 2014-12-13 NOTE — Progress Notes (Signed)
Pt attended spiritual care group on grief and loss facilitated by chaplain Jerene Pitch. Group opened with brief discussion and psycho-social ed around grief and loss in relationships and in relation to self - identifying life patterns, circumstances, changes that cause losses. Established group norm of speaking from own life experience. Group goal of establishing open and affirming space for members to share loss and experience with grief, normalize grief experience and provide psycho social education and grief support.  Group drew on narrative and Alderian therapeutic modalities.   Kim [name] was present and attentive throughout group with appropriate affect.  She did not contribute to group discussion.     12/13/14 1600  Clinical Encounter Type  Visited With Patient;Other (Comment) (Group)  Visit Type Other (Comment);Spiritual support;Psychological support;Behavioral Health (Group)  Spiritual Encounters  Spiritual Needs Grief support;Emotional  Stress Factors  Patient Stress Factors Loss    Creta Levin MDiv

## 2014-12-13 NOTE — Progress Notes (Signed)
Recreation Therapy Notes  Animal-Assisted Activity (AAA) Program Checklist/Progress Notes Patient Eligibility Criteria Checklist & Daily Group note for Rec Tx Intervention  Date: 06.28.16 Time: 2:45 pm Location: [address]  AAA/T Program Assumption of Risk Form signed by Patient/ or Parent Legal Guardian yes  Patient is free of allergies or sever asthma yes  Patient reports no fear of animals yes  Patient reports no history of cruelty to animalsyes  Patient understands his/her participation is voluntary yes  Patient washes hands before animal contact yes  Patient washes hands after animal contact yes  Behavioral Response: Engaged  Education: Contractor, Appropriate Animal Interaction   Education Outcome: Acknowledges understanding/In group clarification offered  Clinical Observations/Feedback: Patient attended group.   Victorino Sparrow, LRT/CTRS         Ria Comment, Clarance Bollard A 12/13/2014 4:00 PM

## 2014-12-13 NOTE — Progress Notes (Signed)
D/C instructions/meds/follow-up appointments reviewed, pt verbalized understanding, pt's belongings returned to pt, samples given. 

## 2014-12-13 NOTE — Progress Notes (Signed)
  D: Pt was appropriate and was observed interacting with peers for most of the evening shift. It was reported that pt stated she'd had a focal seizure earlier today. However pt didn't mention the seizure activity to the Probation officer. When asked about her day pt stated, "pretty good".

## 2014-12-13 NOTE — Progress Notes (Signed)
  [facility] Adult Case Management Discharge Plan :  Will you be returning to the same living situation after discharge:  Yes,  Patient is returning to her home. At discharge, do you have transportation home?: Yes,  Patient is able to arrange transportation. Do you have the ability to pay for your medications: Yes,  Patient has Medicare/Medicaid  Release of information consent forms completed and in the chart;  Patient's signature needed at discharge.  Patient to Follow up at: Follow-up Information    Follow up with [facility] On 12/15/2014.   Why:  You are scheduled with Dian Situ, Thursday, December 15, 2014 at 9:45 AM   Contact information:   [address] [place], [place]   [address]  [phone]      Patient denies SI/HI:  Patient no longer endorsing SI/HI or other thoughts of self harm.  Safety Planning and Suicide Prevention discussed: .Reviewed with all patients during discharge planning group  Have you used any form of tobacco in the last 30 days? (Cigarettes, Smokeless Tobacco, Cigars, and/or Pipes): No  Has patient been referred to the Quitline?: No referral needed to quitline.   Concha Pyo 12/13/2014, 10:01 AM

## 2014-12-22 DIAGNOSIS — F339 Major depressive disorder, recurrent, unspecified: Secondary | ICD-10-CM | POA: Diagnosis not present

## 2015-02-02 ENCOUNTER — Telehealth: Payer: Self-pay | Admitting: Neurology

## 2015-02-02 NOTE — Telephone Encounter (Signed)
error 

## 2015-02-09 ENCOUNTER — Ambulatory Visit (INDEPENDENT_AMBULATORY_CARE_PROVIDER_SITE_OTHER): Payer: Medicare Other | Admitting: Neurology

## 2015-02-09 ENCOUNTER — Ambulatory Visit: Payer: Medicare Other | Admitting: Neurology

## 2015-02-09 ENCOUNTER — Encounter: Payer: Self-pay | Admitting: Neurology

## 2015-02-09 VITALS — BP 119/76 | HR 52 | Ht 64.25 in | Wt 114.0 lb

## 2015-02-09 DIAGNOSIS — G40219 Localization-related (focal) (partial) symptomatic epilepsy and epileptic syndromes with complex partial seizures, intractable, without status epilepticus: Secondary | ICD-10-CM | POA: Diagnosis not present

## 2015-02-09 DIAGNOSIS — Z9889 Other specified postprocedural states: Secondary | ICD-10-CM | POA: Diagnosis not present

## 2015-02-09 DIAGNOSIS — Z9689 Presence of other specified functional implants: Secondary | ICD-10-CM

## 2015-02-09 MED ORDER — DIVALPROEX SODIUM 500 MG PO DR TAB: 500.0000 mg | DELAYED_RELEASE_TABLET | Freq: Two times a day (BID) | ORAL | Status: DC

## 2015-02-09 MED ORDER — GABAPENTIN 100 MG PO CAPS: 100.0000 mg | ORAL_CAPSULE | Freq: Every day | ORAL | Status: DC

## 2015-02-09 MED ORDER — TOPIRAMATE 200 MG PO TABS: 200.0000 mg | ORAL_TABLET | Freq: Two times a day (BID) | ORAL | Status: DC
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
## 2015-04-12 ENCOUNTER — Encounter (INDEPENDENT_AMBULATORY_CARE_PROVIDER_SITE_OTHER): Payer: Self-pay

## 2015-04-12 ENCOUNTER — Encounter: Payer: Self-pay | Admitting: Neurology

## 2015-04-12 ENCOUNTER — Ambulatory Visit (INDEPENDENT_AMBULATORY_CARE_PROVIDER_SITE_OTHER): Payer: Medicare Other | Admitting: Neurology

## 2015-04-12 VITALS — BP 120/80 | HR 69 | Ht 64.25 in | Wt 108.0 lb

## 2015-04-12 DIAGNOSIS — R569 Unspecified convulsions: Secondary | ICD-10-CM

## 2015-04-12 DIAGNOSIS — G40219 Localization-related (focal) (partial) symptomatic epilepsy and epileptic syndromes with complex partial seizures, intractable, without status epilepticus: Secondary | ICD-10-CM

## 2015-04-12 DIAGNOSIS — F322 Major depressive disorder, single episode, severe without psychotic features: Secondary | ICD-10-CM

## 2015-04-12 DIAGNOSIS — M542 Cervicalgia: Secondary | ICD-10-CM

## 2015-04-12 MED ORDER — LAMOTRIGINE ER 25 MG PO TB24: ORAL_TABLET | ORAL | Status: DC

## 2015-04-12 MED ORDER — DIVALPROEX SODIUM ER 500 MG PO TB24: 500.0000 mg | ORAL_TABLET | Freq: Two times a day (BID) | ORAL | Status: DC

## 2015-04-12 MED ORDER — LAMOTRIGINE ER 100 MG PO TB24: 200.0000 mg | ORAL_TABLET | Freq: Every day | ORAL | Status: DC

## 2015-04-12 MED ORDER — TOPIRAMATE ER 100 MG PO CAP24: 300.0000 mg | ORAL_CAPSULE | Freq: Every day | ORAL | Status: DC

## 2015-04-12 MED ORDER — TOPIRAMATE ER 200 MG PO CAP24: 300.0000 mg | ORAL_CAPSULE | Freq: Every day | ORAL | Status: DC
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
## 2015-04-12 NOTE — Patient Instructions (Signed)
Stop gabapentin  Change Topamax to extended release Trokendi 100 mg 3 tablets every night  Change Depakote DR to Depakote extended-release, 500 mg one tablets twice a day

## 2015-04-17 ENCOUNTER — Telehealth: Payer: Self-pay | Admitting: Neurology

## 2015-04-17 NOTE — Telephone Encounter (Signed)
I contacted Medicaid, and they stated the prescription benefits must first go through her Medicare Plan.  I contacted the pharmacy and spoke with pharmacist, who kindly provided ins as [employer] Medicare Part D ID # [id number]  Unfortunately, they did not have a phone number for [place] to call. I was able to get the info online and contacted ins, provided clinical info.  Requests are under review.  Called patient to advise, got no answer.  Left message.  Trokendi ER Ref # [id number]   Lamotrigine ER 25mg  Ref # [id number] Lamotrigine ER 100mg  Ref # [id number]

## 2015-04-17 NOTE — Telephone Encounter (Signed)
Pt needs prior auth on Topiramate ER (TROKENDI XR) 100 MG CP24 , LamoTRIgine (LAMICTAL XR) 100 MG TB24 and LamoTRIgine (LAMICTAL XR) 25 MG TB24 tablet. May call [phone]

## 2015-04-18 NOTE — Telephone Encounter (Signed)
[place] has approved the request for coverage on Trokendi effective until 06/16/2016 Ref # [id number] They have also approved the request for coverage Lamotrigine ER effective until 06/16/2016 Ref # [id number] Pharmacy has been notified, and will contact patient when Rx's are ready for pick up.

## 2015-05-18 ENCOUNTER — Encounter (INDEPENDENT_AMBULATORY_CARE_PROVIDER_SITE_OTHER): Payer: Self-pay

## 2015-05-18 ENCOUNTER — Encounter: Payer: Self-pay | Admitting: Neurology

## 2015-05-18 ENCOUNTER — Ambulatory Visit (INDEPENDENT_AMBULATORY_CARE_PROVIDER_SITE_OTHER): Payer: Medicare Other | Admitting: Neurology

## 2015-05-18 VITALS — BP 128/71 | HR 90 | Ht 66.0 in | Wt 117.5 lb

## 2015-05-18 DIAGNOSIS — G43009 Migraine without aura, not intractable, without status migrainosus: Secondary | ICD-10-CM | POA: Diagnosis not present

## 2015-05-18 DIAGNOSIS — F322 Major depressive disorder, single episode, severe without psychotic features: Secondary | ICD-10-CM

## 2015-05-18 DIAGNOSIS — G43909 Migraine, unspecified, not intractable, without status migrainosus: Secondary | ICD-10-CM | POA: Insufficient documentation

## 2015-05-18 DIAGNOSIS — G40219 Localization-related (focal) (partial) symptomatic epilepsy and epileptic syndromes with complex partial seizures, intractable, without status epilepticus: Secondary | ICD-10-CM | POA: Diagnosis not present

## 2015-05-18 DIAGNOSIS — F332 Major depressive disorder, recurrent severe without psychotic features: Secondary | ICD-10-CM

## 2015-05-18 DIAGNOSIS — G4700 Insomnia, unspecified: Secondary | ICD-10-CM | POA: Insufficient documentation

## 2015-05-18 MED ORDER — CLONAZEPAM 0.5 MG PO TABS: ORAL_TABLET | ORAL | Status: DC

## 2015-05-18 MED ORDER — RIZATRIPTAN BENZOATE 5 MG PO TBDP: 5.0000 mg | ORAL_TABLET | ORAL | Status: DC | PRN

## 2015-05-18 MED ORDER — OXCARBAZEPINE 150 MG PO TABS: 150.0000 mg | ORAL_TABLET | Freq: Two times a day (BID) | ORAL | Status: DC

## 2015-05-18 NOTE — Addendum Note (Signed)
Addended by: Marcial Pacas on: 05/18/2015 10:16 AM   Modules accepted: Orders, Medications
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
## 2015-07-18 ENCOUNTER — Telehealth: Payer: Self-pay | Admitting: Neurology

## 2015-07-18 MED ORDER — SUMATRIPTAN SUCCINATE 6 MG/0.5ML ~~LOC~~ SOLN
SUBCUTANEOUS | Status: DC
Start: 2015-07-18 — End: 2017-06-03

## 2015-07-18 NOTE — Telephone Encounter (Signed)
Patient called requesting something for "a bad headache, she's had for 4 days, thinks the medication that you put under your tongue is a placebo, has taken several 800mg  Ibuprofen, the medication that goes under her tongue rizatriptan (MAXALT-MLT) 5 MG disintegrating tablet, extra strength Excedrin and Tylenol"

## 2015-07-18 NOTE — Telephone Encounter (Signed)
Spoke to Dr. [name], patient has been offered sumatriptan injections.  She is agreeable to new medication and a prescription has been sent to the pharmacy.  Her daughter is a Marine scientist and will be giving her the injections.

## 2015-07-20 ENCOUNTER — Encounter: Payer: Self-pay | Admitting: Neurology

## 2015-07-20 ENCOUNTER — Ambulatory Visit (INDEPENDENT_AMBULATORY_CARE_PROVIDER_SITE_OTHER): Payer: Medicare Other | Admitting: Neurology

## 2015-07-20 VITALS — BP 140/82 | HR 54 | Resp 14 | Ht 66.0 in | Wt 122.0 lb

## 2015-07-20 DIAGNOSIS — G43009 Migraine without aura, not intractable, without status migrainosus: Secondary | ICD-10-CM | POA: Diagnosis not present

## 2015-07-20 DIAGNOSIS — G40219 Localization-related (focal) (partial) symptomatic epilepsy and epileptic syndromes with complex partial seizures, intractable, without status epilepticus: Secondary | ICD-10-CM | POA: Diagnosis not present

## 2015-07-20 DIAGNOSIS — G40209 Localization-related (focal) (partial) symptomatic epilepsy and epileptic syndromes with complex partial seizures, not intractable, without status epilepticus: Secondary | ICD-10-CM

## 2015-07-20 MED ORDER — NEXIUM 40 MG PO CPDR: 40.0000 mg | DELAYED_RELEASE_CAPSULE | Freq: Two times a day (BID) | ORAL | Status: DC
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
## 2015-08-21 ENCOUNTER — Telehealth: Payer: Self-pay | Admitting: Neurology

## 2015-08-21 MED ORDER — FLUOXETINE HCL 40 MG PO CAPS: 40.0000 mg | ORAL_CAPSULE | Freq: Every day | ORAL | Status: DC

## 2015-08-21 NOTE — Telephone Encounter (Signed)
Pt called asking for rx for Prozact. She says that her pcp is not wanting to work with her. Apparently, her daughter used to work there and since then they don't want to work with her. Please call and advise [phone]

## 2015-08-21 NOTE — Telephone Encounter (Signed)
Patient has been on generic Prozac 40mg  daily.  Ok, per vo by Dr. [name], to send in refills.  Patient aware this has been taken care of for her.

## 2015-10-12 ENCOUNTER — Encounter: Payer: Self-pay | Admitting: Neurology

## 2015-10-12 ENCOUNTER — Ambulatory Visit (INDEPENDENT_AMBULATORY_CARE_PROVIDER_SITE_OTHER): Payer: Medicare Other | Admitting: Neurology

## 2015-10-12 VITALS — BP 172/81 | HR 66 | Ht 66.0 in | Wt 118.2 lb

## 2015-10-12 DIAGNOSIS — G43009 Migraine without aura, not intractable, without status migrainosus: Secondary | ICD-10-CM | POA: Diagnosis not present

## 2015-10-12 DIAGNOSIS — G40209 Localization-related (focal) (partial) symptomatic epilepsy and epileptic syndromes with complex partial seizures, not intractable, without status epilepticus: Secondary | ICD-10-CM

## 2015-10-12 DIAGNOSIS — G4700 Insomnia, unspecified: Secondary | ICD-10-CM

## 2015-10-12 DIAGNOSIS — F322 Major depressive disorder, single episode, severe without psychotic features: Secondary | ICD-10-CM | POA: Diagnosis not present

## 2015-10-12 DIAGNOSIS — G40219 Localization-related (focal) (partial) symptomatic epilepsy and epileptic syndromes with complex partial seizures, intractable, without status epilepticus: Secondary | ICD-10-CM | POA: Diagnosis not present

## 2015-10-12 MED ORDER — SUMATRIPTAN SUCCINATE 100 MG PO TABS: 100.0000 mg | ORAL_TABLET | Freq: Once | ORAL | Status: DC | PRN

## 2015-10-12 MED ORDER — DIAZEPAM 10 MG PO TABS: 10.0000 mg | ORAL_TABLET | Freq: Four times a day (QID) | ORAL | Status: DC | PRN

## 2015-10-12 MED ORDER — ONDANSETRON 4 MG PO TBDP: 4.0000 mg | ORAL_TABLET | Freq: Three times a day (TID) | ORAL | Status: DC | PRN

## 2015-10-12 MED ORDER — NEXIUM 40 MG PO CPDR: 40.0000 mg | DELAYED_RELEASE_CAPSULE | Freq: Two times a day (BID) | ORAL | Status: DC
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
## 2015-11-06 ENCOUNTER — Telehealth: Payer: Self-pay | Admitting: *Deleted

## 2015-11-06 NOTE — Telephone Encounter (Signed)
I have called Kim [name], her seizure is overall under good control, but 3 days after her VNS setting was adjusted in last visit October 12 2015, she noticed radiating pain to her left lower tooth with each electric shock, she was evaluated by her dentist recently, x-ray was taken, there was no dental abnormality found, her left lower tooth pain improved after she turned off the machine,    Kim [name], please let her come to my schedule, you need to let Kim [name] know to see if he needs to come for adjustment  Normal OutPut Current: 2.20mA Signal Frequency: 25Hz  from 20 Pulse Width: 500 sec -from 250 Signal on time: 21 second-change from 30 seconds Signal off time: 0.8 minute---it was changed from 0.5 minutes   Magnetic Output Current: 3.0 mA Pulse Width: 500 sec Signal On Time: 40 Sec  Total magnet activations is 158 in April 12 2015 (since implant in December 31st 2014)  We also did diagnostic test, out put current was OK, current delivered was 2.75 A, Lead impedence was Ok, value 2304 ohms
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
## 2015-11-07 NOTE — Telephone Encounter (Signed)
Patient's appt has been rescheduled due to transportation issues this week.  Kim [name] is aware of the new time/date and has confirmed he can be here for the appt.

## 2015-11-07 NOTE — Telephone Encounter (Signed)
Spoke to patient - scheduled her a pending appt for 11/09/15 at 10:30am.  She is making sure she will have transportation that day and will call me back to confirm the time.  I have also sent Remo Lipps w/ VNS a message to make sure this time works with his schedule.

## 2015-11-09 ENCOUNTER — Ambulatory Visit: Payer: Self-pay | Admitting: Neurology

## 2015-11-15 ENCOUNTER — Ambulatory Visit (INDEPENDENT_AMBULATORY_CARE_PROVIDER_SITE_OTHER): Payer: Medicare Other | Admitting: Neurology

## 2015-11-15 ENCOUNTER — Encounter: Payer: Self-pay | Admitting: Neurology

## 2015-11-15 VITALS — BP 129/84 | HR 64 | Ht 66.0 in | Wt 117.5 lb

## 2015-11-15 DIAGNOSIS — F322 Major depressive disorder, single episode, severe without psychotic features: Secondary | ICD-10-CM | POA: Diagnosis not present

## 2015-11-15 DIAGNOSIS — G40219 Localization-related (focal) (partial) symptomatic epilepsy and epileptic syndromes with complex partial seizures, intractable, without status epilepticus: Secondary | ICD-10-CM

## 2015-11-15 DIAGNOSIS — G40209 Localization-related (focal) (partial) symptomatic epilepsy and epileptic syndromes with complex partial seizures, not intractable, without status epilepticus: Secondary | ICD-10-CM | POA: Diagnosis not present

## 2015-11-15 DIAGNOSIS — G4700 Insomnia, unspecified: Secondary | ICD-10-CM | POA: Diagnosis not present

## 2015-11-15 DIAGNOSIS — G43009 Migraine without aura, not intractable, without status migrainosus: Secondary | ICD-10-CM

## 2015-11-15 MED ORDER — DIAZEPAM 10 MG PO TABS: 10.0000 mg | ORAL_TABLET | Freq: Four times a day (QID) | ORAL | Status: DC | PRN
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
## 2016-01-30 ENCOUNTER — Encounter: Payer: Self-pay | Admitting: Neurology

## 2016-01-30 ENCOUNTER — Ambulatory Visit (INDEPENDENT_AMBULATORY_CARE_PROVIDER_SITE_OTHER): Payer: Medicare Other | Admitting: Neurology

## 2016-01-30 VITALS — BP 121/85 | HR 72 | Ht 66.0 in | Wt 121.2 lb

## 2016-01-30 DIAGNOSIS — G40209 Localization-related (focal) (partial) symptomatic epilepsy and epileptic syndromes with complex partial seizures, not intractable, without status epilepticus: Secondary | ICD-10-CM | POA: Diagnosis not present

## 2016-01-30 DIAGNOSIS — F322 Major depressive disorder, single episode, severe without psychotic features: Secondary | ICD-10-CM

## 2016-01-30 DIAGNOSIS — Z79899 Other long term (current) drug therapy: Secondary | ICD-10-CM | POA: Diagnosis not present

## 2016-01-30 DIAGNOSIS — G43009 Migraine without aura, not intractable, without status migrainosus: Secondary | ICD-10-CM

## 2016-01-30 DIAGNOSIS — G40219 Localization-related (focal) (partial) symptomatic epilepsy and epileptic syndromes with complex partial seizures, intractable, without status epilepticus: Secondary | ICD-10-CM

## 2016-01-30 MED ORDER — SUMATRIPTAN SUCCINATE 100 MG PO TABS: 100.0000 mg | ORAL_TABLET | Freq: Once | ORAL | 11 refills | Status: DC | PRN

## 2016-01-30 MED ORDER — FLUOXETINE HCL 20 MG PO TABS: 60.0000 mg | ORAL_TABLET | Freq: Every day | ORAL | 11 refills | Status: DC

## 2016-01-30 MED ORDER — DIVALPROEX SODIUM ER 500 MG PO TB24: 1500.0000 mg | ORAL_TABLET | Freq: Every day | ORAL | 4 refills | Status: DC

## 2016-01-30 MED ORDER — KETOROLAC TROMETHAMINE 60 MG/2ML IM SOLN: 60.0000 mg | Freq: Once | INTRAMUSCULAR | 0 refills | Status: DC

## 2016-01-30 MED ORDER — KETOROLAC TROMETHAMINE 60 MG/2ML IM SOLN
60.0000 mg | Freq: Once | INTRAMUSCULAR | Status: AC
  Administered 2016-01-30: 60 mg via INTRAMUSCULAR

## 2016-01-30 NOTE — Addendum Note (Signed)
Addended by: Desmond Lope on: 01/30/2016 05:23 PM   Modules accepted: Orders
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
## 2016-01-30 NOTE — Addendum Note (Signed)
Addended by: Desmond Lope on: 01/30/2016 05:28 PM   Modules accepted: Orders

## 2016-02-01 ENCOUNTER — Telehealth: Payer: Self-pay | Admitting: Neurology

## 2016-02-01 LAB — COMPREHENSIVE METABOLIC PANEL
ALT: 17 IU/L (ref 0–32)
AST: 25 IU/L (ref 0–40)
Albumin/Globulin Ratio: 2 (ref 1.2–2.2)
Albumin: 4.7 g/dL (ref 3.5–5.5)
Alkaline Phosphatase: 84 IU/L (ref 39–117)
BILIRUBIN TOTAL: 0.2 mg/dL (ref 0.0–1.2)
BUN / CREAT RATIO: 11 (ref 9–23)
BUN: 8 mg/dL (ref 6–24)
CO2: 23 mmol/L (ref 18–29)
CREATININE: 0.74 mg/dL (ref 0.57–1.00)
Calcium: 9.9 mg/dL (ref 8.7–10.2)
Chloride: 105 mmol/L (ref 96–106)
GFR calc Af Amer: 109 mL/min/{1.73_m2} (ref >59)
GFR calc non Af Amer: 95 mL/min/{1.73_m2} (ref >59)
Globulin, Total: 2.3 g/dL (ref 1.5–4.5)
Glucose: 89 mg/dL (ref 65–99)
Potassium: 4.9 mmol/L (ref 3.5–5.2)
Sodium: 149 mmol/L — ABNORMAL HIGH (ref 134–144)
Total Protein: 7 g/dL (ref 6.0–8.5)

## 2016-02-01 LAB — 10-HYDROXYCARBAZEPINE: OXCARBAZEPINE SERPL-MCNC: NOT DETECTED ug/mL (ref 10–35)

## 2016-02-01 LAB — CBC
HEMOGLOBIN: 12.8 g/dL (ref 11.1–15.9)
Hematocrit: 38.4 % (ref 34.0–46.6)
MCH: 36.9 pg — AB (ref 26.6–33.0)
MCHC: 33.3 g/dL (ref 31.5–35.7)
MCV: 111 fL — ABNORMAL HIGH (ref 79–97)
PLATELETS: 183 10*3/uL (ref 150–379)
RBC: 3.47 x10E6/uL — ABNORMAL LOW (ref 3.77–5.28)
RDW: 14.9 % (ref 12.3–15.4)
WBC: 4.8 10*3/uL (ref 3.4–10.8)

## 2016-02-01 LAB — TOPIRAMATE LEVEL: Topiramate Lvl: NOT DETECTED ug/mL (ref 2.0–25.0)

## 2016-02-01 LAB — TSH: TSH: 0.934 u[IU]/mL (ref 0.450–4.500)

## 2016-02-01 LAB — VALPROIC ACID LEVEL: VALPROIC ACID LVL: 35 ug/mL — AB (ref 50–100)

## 2016-02-01 NOTE — Telephone Encounter (Signed)
Please call patient, there was no significant abnormality on CBC, CMP, normal TSH,  Depakote level was 35, Trileptal, and Topamax level was nondetectable,  Gerald Stabs double check with patient how she takes her medications,

## 2016-02-01 NOTE — Telephone Encounter (Signed)
Reviewed her medication list - states she normally takes all three anticonvulsants, as prescribed but she will occasionally forget a dose.  Due to her recent labs, she will ask her husband to help her manage her medications.

## 2016-03-26 DIAGNOSIS — S42001A Fracture of unspecified part of right clavicle, initial encounter for closed fracture: Secondary | ICD-10-CM | POA: Diagnosis not present

## 2016-04-09 ENCOUNTER — Telehealth: Payer: Self-pay | Admitting: Neurology

## 2016-04-09 NOTE — Telephone Encounter (Signed)
Pt called to advise she "had a spell and fell off the front porch". She fx her collarbone. She said that was 14 days ago. She did go to an Urgent Care in [place]. She said she was prescribed Norco but she did not get it filled. She has been taking tylenol and tylenol PM. She wanted Dr [name] know.

## 2016-04-09 NOTE — Telephone Encounter (Signed)
Dr. [name] notified of seizure. Spoke to [place] - she is feeling better.  She will keep her pending appt on 05/01/16.

## 2016-04-29 ENCOUNTER — Telehealth: Payer: Self-pay | Admitting: Neurology

## 2016-04-29 NOTE — Telephone Encounter (Signed)
VNS:  Model 105    Generator Serial No. [id number]  Normal OutPut Current: 2.58mA Signal Frequency: 20Hz  from 25 Pulse Width:  250 sec -from 500 Signal on time: 30 second-change from 21 seconds Signal off time:  0.8 minute   Magnetic Output Current: 3.0 mA Pulse Width: 500  sec Signal On Time:  60 Sec Lead impedance was OK 3099OMHS  Total magnet activations is 794 on August 15th 2017 (since implant in December 31st 2014)  We run diagnostic test, out put current was OK, current delivered was 2.75 A, Lead impedence was Ok, value 2191 ohms   l confirmed the VNS settings. The VNS stimulator was interrogated and no change was made. Engineer, manufacturing Code [phone])  Interrogate Device,  Menu   System diagnostic,   ? Change frequency 25.

## 2016-05-01 ENCOUNTER — Encounter: Payer: Self-pay | Admitting: Neurology

## 2016-05-01 ENCOUNTER — Ambulatory Visit (INDEPENDENT_AMBULATORY_CARE_PROVIDER_SITE_OTHER): Payer: Medicare Other | Admitting: Neurology

## 2016-05-01 VITALS — BP 111/80 | HR 68 | Ht 66.0 in | Wt 130.0 lb

## 2016-05-01 DIAGNOSIS — F332 Major depressive disorder, recurrent severe without psychotic features: Secondary | ICD-10-CM | POA: Diagnosis not present

## 2016-05-01 DIAGNOSIS — G40219 Localization-related (focal) (partial) symptomatic epilepsy and epileptic syndromes with complex partial seizures, intractable, without status epilepticus: Secondary | ICD-10-CM | POA: Diagnosis not present

## 2016-05-01 DIAGNOSIS — G40209 Localization-related (focal) (partial) symptomatic epilepsy and epileptic syndromes with complex partial seizures, not intractable, without status epilepticus: Secondary | ICD-10-CM | POA: Diagnosis not present

## 2016-05-01 DIAGNOSIS — G43009 Migraine without aura, not intractable, without status migrainosus: Secondary | ICD-10-CM

## 2016-05-01 MED ORDER — MIRTAZAPINE 7.5 MG PO TABS: 7.5000 mg | ORAL_TABLET | Freq: Every day | ORAL | 4 refills | Status: DC

## 2016-05-01 MED ORDER — DIAZEPAM 10 MG PO TABS: 10.0000 mg | ORAL_TABLET | Freq: Four times a day (QID) | ORAL | 5 refills | Status: DC | PRN

## 2016-05-01 MED ORDER — TOPIRAMATE ER 100 MG PO CAP24: 300.0000 mg | ORAL_CAPSULE | Freq: Every day | ORAL | 4 refills | Status: DC

## 2016-05-01 MED ORDER — OXCARBAZEPINE 150 MG PO TABS: 150.0000 mg | ORAL_TABLET | Freq: Two times a day (BID) | ORAL | 4 refills | Status: DC
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
## 2016-07-23 ENCOUNTER — Telehealth: Payer: Self-pay | Admitting: *Deleted

## 2016-07-23 NOTE — Telephone Encounter (Signed)
Trokendi XR and Nexium approved by [place] [phone]) for continuation of care (valid through 06/16/17 - member ID#[id number]).

## 2016-08-12 ENCOUNTER — Ambulatory Visit: Payer: Medicare Other | Admitting: Neurology

## 2016-08-15 ENCOUNTER — Other Ambulatory Visit: Payer: Self-pay | Admitting: Neurology

## 2016-08-15 ENCOUNTER — Other Ambulatory Visit: Payer: Self-pay | Admitting: *Deleted

## 2016-08-15 MED ORDER — NEXIUM 40 MG PO CPDR: 40.0000 mg | DELAYED_RELEASE_CAPSULE | Freq: Two times a day (BID) | ORAL | 11 refills | Status: DC

## 2016-09-03 ENCOUNTER — Encounter: Payer: Self-pay | Admitting: Neurology

## 2016-09-03 ENCOUNTER — Ambulatory Visit (INDEPENDENT_AMBULATORY_CARE_PROVIDER_SITE_OTHER): Payer: Medicare Other | Admitting: Neurology

## 2016-09-03 VITALS — BP 144/93 | HR 74 | Ht 66.0 in | Wt 124.0 lb

## 2016-09-03 DIAGNOSIS — G4700 Insomnia, unspecified: Secondary | ICD-10-CM

## 2016-09-03 DIAGNOSIS — G43009 Migraine without aura, not intractable, without status migrainosus: Secondary | ICD-10-CM

## 2016-09-03 DIAGNOSIS — Z79899 Other long term (current) drug therapy: Secondary | ICD-10-CM

## 2016-09-03 DIAGNOSIS — G40209 Localization-related (focal) (partial) symptomatic epilepsy and epileptic syndromes with complex partial seizures, not intractable, without status epilepticus: Secondary | ICD-10-CM
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
## 2016-10-08 ENCOUNTER — Ambulatory Visit (HOSPITAL_COMMUNITY)
Admission: RE | Admit: 2016-10-08 | Discharge: 2016-10-08 | Disposition: A | Discharge status: Home / Self Care | Payer: Medicare Other | Attending: Psychiatry | Admitting: Psychiatry

## 2016-10-08 DIAGNOSIS — Z9114 Patient's other noncompliance with medication regimen: Secondary | ICD-10-CM | POA: Insufficient documentation

## 2016-10-08 DIAGNOSIS — F332 Major depressive disorder, recurrent severe without psychotic features: Secondary | ICD-10-CM | POA: Insufficient documentation

## 2016-10-08 DIAGNOSIS — R569 Unspecified convulsions: Secondary | ICD-10-CM | POA: Insufficient documentation

## 2016-10-08 NOTE — H&P (Signed)
Behavioral Health Medical Screening Exam  Kim [name] is an 51 y.o. female, presenting with her daughter requesting psychiatric intervention due to progressive, pervasive depressive symptoms culminating with depression with SI/Plan and self mutilation. She has been non compliant with her medications x 2 years duration. She is endorsing a hx of seizure d/o with daily petite mal seizures and grand mal seizures  5 - 6 q 3- 4 month intervals.  Total Time spent with patient: 20 minutes  Psychiatric Specialty Exam: Physical Exam  Constitutional: She is oriented to person, place, and time. She appears well-developed and well-nourished. No distress.  HENT:  Head: Normocephalic.  Eyes: Pupils are equal, round, and reactive to light.  Respiratory: Effort normal and breath sounds normal.  Neurological: She is alert and oriented to person, place, and time. No cranial nerve deficit.  Skin: Skin is warm and dry. She is not diaphoretic.  Psychiatric: Her speech is normal. Judgment normal. She is withdrawn. Cognition and memory are normal. She exhibits a depressed mood. She expresses suicidal ideation. She expresses suicidal plans. She expresses no homicidal plans.    Review of Systems  Neurological: Positive for seizures.  Psychiatric/Behavioral: Positive for depression, substance abuse and suicidal ideas.  All other systems reviewed and are negative.   There were no vitals taken for this visit.There is no height or weight on file to calculate BMI.  General Appearance: Casual  Eye Contact:  Good  Speech:  Clear and Coherent  Volume:  Normal  Mood:  Depressed  Affect:  Congruent  Thought Process:  Goal Directed  Orientation:  Full (Time, Place, and Person)  Thought Content:  Negative  Suicidal Thoughts:  Yes.  with intent/plan  Homicidal Thoughts:  No  Memory:  Immediate;   Good  Judgement:  Poor  Insight:  Lacking  Psychomotor Activity:  Negative  Concentration: Concentration: Good  Recall:   [place]  Language: Good  Akathisia:  Negative  Handed:  Right  AIMS (if indicated):     Assets:  Desire for Improvement  Sleep:       Musculoskeletal: Strength & Muscle Tone: within normal limits Gait & Station: normal Patient leans: N/A  There were no vitals taken for this visit.  Recommendations:  Based on my evaluation the patient appears to have an emergency medical condition for which I recommend the patient be transferred to the emergency department for further evaluation.  Laverle Hobby, PA-C 10/08/2016, 11:45 PM

## 2016-10-09 ENCOUNTER — Emergency Department (HOSPITAL_COMMUNITY)
Admission: EM | Admit: 2016-10-09 | Discharge: 2016-10-09 | Disposition: A | Discharge status: Other Acute Inpatient Hospital | Payer: Medicare Other | Attending: Emergency Medicine | Admitting: Emergency Medicine

## 2016-10-09 ENCOUNTER — Encounter (HOSPITAL_COMMUNITY): Payer: Self-pay

## 2016-10-09 DIAGNOSIS — R45851 Suicidal ideations: Secondary | ICD-10-CM

## 2016-10-09 DIAGNOSIS — F332 Major depressive disorder, recurrent severe without psychotic features: Secondary | ICD-10-CM | POA: Insufficient documentation

## 2016-10-09 DIAGNOSIS — Z87891 Personal history of nicotine dependence: Secondary | ICD-10-CM | POA: Diagnosis not present

## 2016-10-09 DIAGNOSIS — Z79899 Other long term (current) drug therapy: Secondary | ICD-10-CM | POA: Insufficient documentation

## 2016-10-09 DIAGNOSIS — F329 Major depressive disorder, single episode, unspecified: Secondary | ICD-10-CM | POA: Diagnosis not present

## 2016-10-09 LAB — CBC
HCT: 41.3 % (ref 36.0–46.0)
Hemoglobin: 14.1 g/dL (ref 12.0–15.0)
MCH: 37.4 pg — ABNORMAL HIGH (ref 26.0–34.0)
MCHC: 34.1 g/dL (ref 30.0–36.0)
MCV: 109.5 fL — ABNORMAL HIGH (ref 78.0–100.0)
Platelets: 188 10*3/uL (ref 150–400)
RBC: 3.77 MIL/uL — ABNORMAL LOW (ref 3.87–5.11)
RDW: 13.9 % (ref 11.5–15.5)
WBC: 5.2 10*3/uL (ref 4.0–10.5)

## 2016-10-09 LAB — RAPID URINE DRUG SCREEN, HOSP PERFORMED
AMPHETAMINES: NOT DETECTED
Barbiturates: NOT DETECTED
Benzodiazepines: POSITIVE — AB
COCAINE: NOT DETECTED
OPIATES: NOT DETECTED
Tetrahydrocannabinol: POSITIVE — AB

## 2016-10-09 LAB — COMPREHENSIVE METABOLIC PANEL
ALT: 12 U/L — AB (ref 14–54)
AST: 20 U/L (ref 15–41)
Albumin: 4.3 g/dL (ref 3.5–5.0)
Alkaline Phosphatase: 68 U/L (ref 38–126)
Anion gap: 10 (ref 5–15)
BUN: 13 mg/dL (ref 6–20)
CHLORIDE: 110 mmol/L (ref 101–111)
CO2: 23 mmol/L (ref 22–32)
CREATININE: 0.86 mg/dL (ref 0.44–1.00)
Calcium: 9.4 mg/dL (ref 8.9–10.3)
GFR calc non Af Amer: >60 mL/min (ref >60)
Glucose, Bld: 93 mg/dL (ref 65–99)
Potassium: 3.3 mmol/L — ABNORMAL LOW (ref 3.5–5.1)
SODIUM: 143 mmol/L (ref 135–145)
Total Bilirubin: 0.2 mg/dL — ABNORMAL LOW (ref 0.3–1.2)
Total Protein: 7.4 g/dL (ref 6.5–8.1)

## 2016-10-09 LAB — SALICYLATE LEVEL: Salicylate Lvl: <7 mg/dL (ref 2.8–30.0)

## 2016-10-09 LAB — ETHANOL: Alcohol, Ethyl (B): 31 mg/dL — ABNORMAL HIGH (ref <5)

## 2016-10-09 LAB — ACETAMINOPHEN LEVEL: Acetaminophen (Tylenol), Serum: 11 ug/mL (ref 10–30)

## 2016-10-09 LAB — POC URINE PREG, ED: Preg Test, Ur: NEGATIVE

## 2016-10-09 MED ORDER — KETOROLAC TROMETHAMINE 60 MG/2ML IM SOLN
60.0000 mg | Freq: Once | INTRAMUSCULAR | Status: AC
  Administered 2016-10-09: 60 mg via INTRAMUSCULAR
  Filled 2016-10-09: qty 2

## 2016-10-09 MED ORDER — ZOLPIDEM TARTRATE 5 MG PO TABS: 5.0000 mg | ORAL_TABLET | Freq: Every evening | ORAL | Status: DC | PRN

## 2016-10-09 MED ORDER — ALUM & MAG HYDROXIDE-SIMETH 200-200-20 MG/5ML PO SUSP: 30.0000 mL | ORAL | Status: DC | PRN

## 2016-10-09 MED ORDER — FLUOXETINE HCL 20 MG PO CAPS
60.0000 mg | ORAL_CAPSULE | Freq: Every day | ORAL | Status: DC
Start: 2016-10-09 — End: 2016-10-09
  Administered 2016-10-09: 60 mg via ORAL
  Filled 2016-10-09: qty 3

## 2016-10-09 MED ORDER — TOPIRAMATE 100 MG PO TABS
100.0000 mg | ORAL_TABLET | Freq: Three times a day (TID) | ORAL | Status: DC
  Administered 2016-10-09 (×2): 100 mg via ORAL
  Filled 2016-10-09 (×2): qty 1

## 2016-10-09 MED ORDER — LOPERAMIDE HCL 2 MG PO CAPS
2.0000 mg | ORAL_CAPSULE | Freq: Once | ORAL | Status: AC
  Administered 2016-10-09: 2 mg via ORAL
  Filled 2016-10-09: qty 1

## 2016-10-09 MED ORDER — OXCARBAZEPINE 150 MG PO TABS
150.0000 mg | ORAL_TABLET | Freq: Two times a day (BID) | ORAL | Status: DC
  Administered 2016-10-09 (×2): 150 mg via ORAL
  Filled 2016-10-09 (×2): qty 1

## 2016-10-09 MED ORDER — DIPHENHYDRAMINE HCL 50 MG/ML IJ SOLN
25.0000 mg | Freq: Once | INTRAMUSCULAR | Status: AC
  Administered 2016-10-09: 25 mg via INTRAMUSCULAR
  Filled 2016-10-09: qty 1

## 2016-10-09 MED ORDER — ACETAMINOPHEN 325 MG PO TABS
650.0000 mg | ORAL_TABLET | ORAL | Status: DC | PRN
Start: 2016-10-09 — End: 2016-10-09
  Administered 2016-10-09: 650 mg via ORAL
  Filled 2016-10-09: qty 2

## 2016-10-09 MED ORDER — ONDANSETRON HCL 4 MG PO TABS: 4.0000 mg | ORAL_TABLET | Freq: Three times a day (TID) | ORAL | Status: DC | PRN

## 2016-10-09 MED ORDER — DIVALPROEX SODIUM ER 500 MG PO TB24: 1500.0000 mg | ORAL_TABLET | Freq: Every day | ORAL | Status: DC

## 2016-10-09 MED ORDER — PROCHLORPERAZINE EDISYLATE 5 MG/ML IJ SOLN
10.0000 mg | Freq: Once | INTRAMUSCULAR | Status: AC
  Administered 2016-10-09: 10 mg via INTRAMUSCULAR
  Filled 2016-10-09: qty 2

## 2016-10-09 MED ORDER — IBUPROFEN 200 MG PO TABS: 600.0000 mg | ORAL_TABLET | Freq: Three times a day (TID) | ORAL | Status: DC | PRN

## 2016-10-09 MED ORDER — PANTOPRAZOLE SODIUM 40 MG PO TBEC
40.0000 mg | DELAYED_RELEASE_TABLET | Freq: Every day | ORAL | Status: DC
  Administered 2016-10-09: 40 mg via ORAL
  Filled 2016-10-09: qty 1

## 2016-10-09 NOTE — ED Notes (Signed)
Spoke to pt's family and let them know about the transfer per pt's permission and request. Family agrees to transfer the pt . Facility's address and phone number provided to family.

## 2016-10-09 NOTE — ED Notes (Signed)
Bed: [facility] Expected date:  Expected time:  Means of arrival:  Comments: 
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
## 2016-10-09 NOTE — ED Provider Notes (Signed)
[facility] DEPT Provider Note   CSN: [id number] Arrival date & time: 10/09/16  0016   By signing my name below, I, Kim [name], attest that this documentation has been prepared under the direction and in the presence of [employer], PA-C. Electronically Signed: Mayer [name], Scribe. 10/09/16. 1:50 AM.  History   Chief Complaint Chief Complaint  Patient presents with  . Suicidal    The history is provided by the patient. No language interpreter was used.   HPI Comments: Kim [name] is a 51 y.o. female who presents to the Emergency Department complaining of gradually worsening suicidal thoughts since last week. She has a h/o suicide attempts the last was in 2016. She denies hallucinations. She has a PMHx of epileptic seizures. She states she takes medication for her seizures, but sometimes forgets.  She also complains of migraine with associated vomiting.   Past Medical History:  Diagnosis Date  . Depression   . Migraines   . Neck pain   . Seizures [facility])     Patient Active Problem List   Diagnosis Date Noted  . Polypharmacy 01/30/2016  . Epilepsy ([place]) 07/20/2015  . Migraine 05/18/2015  . Insomnia 05/18/2015  . MDD (major depressive disorder), single episode, severe ([place]) 12/10/2014  . Major depressive disorder, recurrent, severe without psychotic features ([place])   . Alcohol use disorder, moderate, dependence ([place])   . Marijuana use, episodic     Past Surgical History:  Procedure Laterality Date  . IMPLANTATION VAGAL NERVE STIMULATOR     2001, 2008, 2014    OB History    No data available       Home Medications    Prior to Admission medications   Medication Sig Start Date End Date Taking? Authorizing Provider  diazepam (VALIUM) 10 MG tablet Take 1 tablet (10 mg total) by mouth every 6 (six) hours as needed for anxiety. 05/01/16  Yes Marcial Pacas, MD  divalproex (DEPAKOTE ER) 500 MG 24 hr tablet Take 3 tablets (1,500 mg total) by mouth at bedtime. 01/30/16   Yes Marcial Pacas, MD  FLUoxetine (PROZAC) 20 MG tablet Take 3 tablets (60 mg total) by mouth daily. 01/30/16  Yes Marcial Pacas, MD  ibuprofen (ADVIL,MOTRIN) 800 MG tablet Take 1 tablet (800 mg total) by mouth every 8 (eight) hours as needed for headache or mild pain. 12/13/14  Yes Encarnacion Slates, NP  NEXIUM 40 MG capsule Take 1 capsule (40 mg total) by mouth 2 (two) times daily. For acid reflux 08/15/16  Yes Marcial Pacas, MD  ondansetron (ZOFRAN ODT) 4 MG disintegrating tablet Take 1 tablet (4 mg total) by mouth every 8 (eight) hours as needed for nausea or vomiting. 10/12/15  Yes Marcial Pacas, MD  OXcarbazepine (TRILEPTAL) 150 MG tablet Take 1 tablet (150 mg total) by mouth 2 (two) times daily. 05/01/16  Yes Marcial Pacas, MD  SUMAtriptan (IMITREX) 100 MG tablet Take 1 tablet (100 mg total) by mouth once as needed for migraine. May repeat in 2 hours if headache persists or recurs. 01/30/16  Yes Marcial Pacas, MD  SUMAtriptan (IMITREX) 6 MG/0.5ML SOLN injection Inject 0.108ml at onset of migraine. May repeat in 2 hours prn.  Max: 2 injections in  24 hours. 07/18/15  Yes Marcial Pacas, MD  Topiramate ER (TROKENDI XR) 100 MG CP24 Take 300 mg by mouth at bedtime. 05/01/16  Yes Marcial Pacas, MD    Family History Family History  Problem Relation Age of Onset  . Hyperlipidemia Mother   . Hypertension  Mother   . Hyperlipidemia Father   . Hypertension Father   . Lung cancer Father   . Diabetes Father   . Heart disease Father     Social History Social History  Substance Use Topics  . Smoking status: Former Research scientist (life sciences)  . Smokeless tobacco: Never Used     Comment: Quit 2001  . Alcohol use No     Comment: No drinking in six weeks.  She was previously drinking daily. Recent completion of rehab.     Allergies   Lacosamide; Levetiracetam; Other; Prednisone; and Tizanidine   Review of Systems Review of Systems All systems reviewed and are negative for acute change except as noted in the HPI.   Physical Exam Updated Vital  Signs BP (!) 156/114 (BP Location: Left Arm)   Pulse 71   Temp 97.8 F (36.6 C) (Oral)   Resp 18   SpO2 100%   Physical Exam  Constitutional: She is oriented to person, place, and time. She appears well-developed and well-nourished. No distress.  HENT:  Head: Normocephalic and atraumatic.  Mouth/Throat: Oropharynx is clear and moist.  Eyes: Conjunctivae are normal. Pupils are equal, round, and reactive to light.  Neck: Normal range of motion. Neck supple.  Cardiovascular: Normal rate, regular rhythm, normal heart sounds and intact distal pulses.  Exam reveals no gallop and no friction rub.   No murmur heard. Pulmonary/Chest: Effort normal and breath sounds normal. No respiratory distress. She has no wheezes. She has no rales. She exhibits no tenderness.  Neurological: She is alert and oriented to person, place, and time. She exhibits normal muscle tone. Coordination normal.  Skin: Skin is warm and dry. Capillary refill takes less than 2 seconds. No rash noted. No erythema.  Psychiatric: Her behavior is normal. Thought content is not paranoid and not delusional. She exhibits a depressed mood. She expresses suicidal ideation. She expresses no homicidal ideation. She expresses suicidal plans. She expresses no homicidal plans.  Nursing note and vitals reviewed.    ED Treatments / Results  DIAGNOSTIC STUDIES: Oxygen Saturation is 100% on RA, normal by my interpretation.    COORDINATION OF CARE: 1:51 AM Discussed treatment plan with pt at bedside and pt agreed to plan.   Labs (all labs ordered are listed, but only abnormal results are displayed) Labs Reviewed  CBC - Abnormal; Notable for the following:       Result Value   RBC 3.77 (*)    MCV 109.5 (*)    MCH 37.4 (*)    All other components within normal limits  RAPID URINE DRUG SCREEN, HOSP PERFORMED - Abnormal; Notable for the following:    Benzodiazepines POSITIVE (*)    Tetrahydrocannabinol POSITIVE (*)    All other  components within normal limits  COMPREHENSIVE METABOLIC PANEL  ETHANOL  SALICYLATE LEVEL  ACETAMINOPHEN LEVEL  POC URINE PREG, ED    EKG  EKG Interpretation None       Radiology No results found.  Procedures Procedures (including critical care time)  Medications Ordered in ED Medications - No data to display   Initial Impression / Assessment and Plan / ED Course  I have reviewed the triage vital signs and the nursing notes.  Pertinent labs & imaging results that were available during my care of the patient were reviewed by me and considered in my medical decision making (see chart for details).    The patient will need TTS assessment for suicidal ideations.     Final Clinical Impressions(s) /  ED Diagnoses   Final diagnoses:  None  I personally performed the services described in this documentation, which was scribed in my presence. The recorded information has been reviewed and is accurate.  New Prescriptions New Prescriptions   No medications on file     Dalia Heading, PA-C 10/09/16 0234    Dalia Heading, PA-C 10/09/16 0277    Ripley Fraise, MD 10/11/16 [phone]

## 2016-10-09 NOTE — ED Notes (Signed)
Bed: [facility] Expected date:  Expected time:  Means of arrival:  Comments: 

## 2016-10-09 NOTE — ED Notes (Signed)
Made Dr Alvino Chapel aware of patient c/o diarrhea.   Got verbal order for one tablet of Imodium

## 2016-10-09 NOTE — ED Notes (Addendum)
Anderson Malta from [facility] in [place] called this RN , pt is accepted at this facility. [facility] has been called and transports is scheduled to be around 0430 pm.  [facility] at [place],  [place]  Accepting MD, Dr Artemio Aly

## 2016-10-09 NOTE — ED Triage Notes (Signed)
Pt reports suicide attempt last Tuesday pt vertically lacerated her wrists bilaterally. Pts wounds appear in normal healing stages. Pt reports increased SI and depression, was sent here by [facility] due no bed availability at this time. Pt reports consuming 3 to 4 shots of liquor within the last 24hrs.

## 2016-10-13 DIAGNOSIS — G40909 Epilepsy, unspecified, not intractable, without status epilepticus: Secondary | ICD-10-CM | POA: Diagnosis not present

## 2016-10-13 DIAGNOSIS — M542 Cervicalgia: Secondary | ICD-10-CM | POA: Diagnosis not present

## 2016-10-13 DIAGNOSIS — R569 Unspecified convulsions: Secondary | ICD-10-CM | POA: Diagnosis not present

## 2016-10-13 DIAGNOSIS — R51 Headache: Secondary | ICD-10-CM | POA: Diagnosis not present

## 2016-10-13 DIAGNOSIS — G8929 Other chronic pain: Secondary | ICD-10-CM | POA: Diagnosis not present

## 2016-10-21 DIAGNOSIS — F3341 Major depressive disorder, recurrent, in partial remission: Secondary | ICD-10-CM | POA: Diagnosis not present

## 2016-10-31 ENCOUNTER — Telehealth: Payer: Self-pay | Admitting: Neurology

## 2016-10-31 NOTE — Telephone Encounter (Signed)
Pt said her daughter have taken diazepam (VALIUM) 10 MG tablet bc she had "a spell and did something I shouldn't have and was sent somewhere if you know what I mean". Pt said she started drinking and taking the medication together, said her daughter is a Therapist, sports. Also said daughter will not let her have SUMAtriptan (IMITREX) 6 MG/0.5ML SOLN injection . She is requesting RX for ibuprofen (ADVIL,MOTRIN) 800 MG tablet and vistaril 25mg . Dr [name] has not prescribed these in the past.

## 2016-10-31 NOTE — Telephone Encounter (Signed)
Spoke to patient - reports having a relapse with her alcoholism due to worsening depression and started drinking while taking her medications.  She went to rehab for two weeks and is now home and feeling better.  While she was in rehab, her daughter removed her medications from her home in fear the patient would start using alcohol again.  I encouraged her to see her counselor to address her depression, alcoholism and help in resolving her issues with her daughter.  She was given Vistaril while in rehab for anxiety and has requested Dr. [name] to provide a prescription for this medication today. Dr. [name] has declined to refill this medication.  She is not the appropriate MD to manage this medication.  The patient will contact her PCP for an appointment today.  I suggested it would be a good idea to take her daughter with her to the appointment.  She was agreeable with this plan.

## 2016-11-27 ENCOUNTER — Other Ambulatory Visit: Payer: Self-pay | Admitting: Neurology

## 2016-11-27 NOTE — Telephone Encounter (Signed)
Pt w/ hx of alcoholism, recent rehab, called office last month admitting to drinking again.

## 2016-12-10 ENCOUNTER — Ambulatory Visit: Payer: Medicare Other | Admitting: Neurology

## 2016-12-11 ENCOUNTER — Telehealth: Payer: Self-pay | Admitting: Neurology

## 2016-12-11 NOTE — Telephone Encounter (Signed)
Kim [name] needed the names of the medications in her allergy list.  She is completing new patient paperwork to become established with a psychiatrist.
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
## 2016-12-12 DIAGNOSIS — F321 Major depressive disorder, single episode, moderate: Secondary | ICD-10-CM | POA: Diagnosis not present

## 2016-12-19 ENCOUNTER — Other Ambulatory Visit: Payer: Self-pay | Admitting: Neurology

## 2017-01-01 ENCOUNTER — Ambulatory Visit (INDEPENDENT_AMBULATORY_CARE_PROVIDER_SITE_OTHER): Payer: Medicare Other | Admitting: Neurology

## 2017-01-01 ENCOUNTER — Encounter: Payer: Self-pay | Admitting: Nurse Practitioner

## 2017-01-01 ENCOUNTER — Ambulatory Visit (INDEPENDENT_AMBULATORY_CARE_PROVIDER_SITE_OTHER): Payer: Self-pay | Admitting: Nurse Practitioner

## 2017-01-01 ENCOUNTER — Encounter: Payer: Self-pay | Admitting: Neurology

## 2017-01-01 VITALS — BP 107/67 | HR 69 | Ht 66.0 in | Wt 135.0 lb

## 2017-01-01 VITALS — BP 107/67 | HR 69 | Wt 135.0 lb

## 2017-01-01 DIAGNOSIS — G40219 Localization-related (focal) (partial) symptomatic epilepsy and epileptic syndromes with complex partial seizures, intractable, without status epilepticus: Secondary | ICD-10-CM | POA: Diagnosis not present

## 2017-01-01 DIAGNOSIS — G40209 Localization-related (focal) (partial) symptomatic epilepsy and epileptic syndromes with complex partial seizures, not intractable, without status epilepticus: Secondary | ICD-10-CM | POA: Diagnosis not present

## 2017-01-01 DIAGNOSIS — F322 Major depressive disorder, single episode, severe without psychotic features: Secondary | ICD-10-CM

## 2017-01-01 DIAGNOSIS — Z79899 Other long term (current) drug therapy: Secondary | ICD-10-CM

## 2017-01-01 DIAGNOSIS — G40909 Epilepsy, unspecified, not intractable, without status epilepticus: Secondary | ICD-10-CM

## 2017-01-01 DIAGNOSIS — G4700 Insomnia, unspecified: Secondary | ICD-10-CM | POA: Diagnosis not present

## 2017-01-01 MED ORDER — SUMATRIPTAN SUCCINATE 100 MG PO TABS: 100.0000 mg | ORAL_TABLET | Freq: Once | ORAL | 11 refills | Status: DC | PRN

## 2017-01-01 MED ORDER — OXCARBAZEPINE 150 MG PO TABS: 300.0000 mg | ORAL_TABLET | Freq: Two times a day (BID) | ORAL | 4 refills | Status: DC

## 2017-01-01 MED ORDER — CLOBAZAM 10 MG PO TABS: 20.0000 mg | ORAL_TABLET | Freq: Every day | ORAL | 5 refills | Status: DC
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
## 2017-01-02 NOTE — Progress Notes (Signed)
Patient seen by Dr. [name] has VNS

## 2017-01-07 ENCOUNTER — Telehealth: Payer: Self-pay | Admitting: Neurology

## 2017-01-07 NOTE — Telephone Encounter (Signed)
PA for Onfi approved by [employer] ([id number]) through 06/16/17.  Referral #[id number].  Pt ID#[id number].  Pharmacy has been notified and will fill the medication for the patient.

## 2017-01-07 NOTE — Telephone Encounter (Signed)
Pt calling re: the prior authorization needed for the cloBAZam (ONFI) 10 MG tablet, pt was told by  [place], [place] [phone] (Phone) [phone] (Fax)   That they have not been contacted yet, please call

## 2017-01-14 DIAGNOSIS — F322 Major depressive disorder, single episode, severe without psychotic features: Secondary | ICD-10-CM | POA: Diagnosis not present

## 2017-01-14 DIAGNOSIS — G40909 Epilepsy, unspecified, not intractable, without status epilepticus: Secondary | ICD-10-CM | POA: Diagnosis not present

## 2017-01-14 DIAGNOSIS — T424X2A Poisoning by benzodiazepines, intentional self-harm, initial encounter: Secondary | ICD-10-CM | POA: Diagnosis not present

## 2017-01-14 DIAGNOSIS — F4310 Post-traumatic stress disorder, unspecified: Secondary | ICD-10-CM | POA: Diagnosis not present

## 2017-01-14 DIAGNOSIS — R45851 Suicidal ideations: Secondary | ICD-10-CM | POA: Diagnosis not present

## 2017-01-14 DIAGNOSIS — G4089 Other seizures: Secondary | ICD-10-CM | POA: Diagnosis not present

## 2017-01-14 DIAGNOSIS — F603 Borderline personality disorder: Secondary | ICD-10-CM | POA: Diagnosis not present

## 2017-01-14 DIAGNOSIS — Z62810 Personal history of physical and sexual abuse in childhood: Secondary | ICD-10-CM | POA: Diagnosis not present

## 2017-01-14 DIAGNOSIS — F329 Major depressive disorder, single episode, unspecified: Secondary | ICD-10-CM | POA: Diagnosis not present

## 2017-01-15 DIAGNOSIS — Z62810 Personal history of physical and sexual abuse in childhood: Secondary | ICD-10-CM | POA: Diagnosis present

## 2017-01-15 DIAGNOSIS — G43109 Migraine with aura, not intractable, without status migrainosus: Secondary | ICD-10-CM | POA: Diagnosis present

## 2017-01-15 DIAGNOSIS — Z915 Personal history of self-harm: Secondary | ICD-10-CM | POA: Diagnosis not present

## 2017-01-15 DIAGNOSIS — F4310 Post-traumatic stress disorder, unspecified: Secondary | ICD-10-CM | POA: Diagnosis present

## 2017-01-15 DIAGNOSIS — R45851 Suicidal ideations: Secondary | ICD-10-CM | POA: Diagnosis present

## 2017-01-15 DIAGNOSIS — Z87891 Personal history of nicotine dependence: Secondary | ICD-10-CM | POA: Diagnosis not present

## 2017-01-15 DIAGNOSIS — F329 Major depressive disorder, single episode, unspecified: Secondary | ICD-10-CM | POA: Diagnosis not present

## 2017-01-15 DIAGNOSIS — Z9114 Patient's other noncompliance with medication regimen: Secondary | ICD-10-CM | POA: Diagnosis not present

## 2017-01-15 DIAGNOSIS — F411 Generalized anxiety disorder: Secondary | ICD-10-CM | POA: Diagnosis present

## 2017-01-15 DIAGNOSIS — G40909 Epilepsy, unspecified, not intractable, without status epilepticus: Secondary | ICD-10-CM | POA: Diagnosis present

## 2017-01-15 DIAGNOSIS — F603 Borderline personality disorder: Secondary | ICD-10-CM | POA: Diagnosis present

## 2017-01-15 DIAGNOSIS — F322 Major depressive disorder, single episode, severe without psychotic features: Secondary | ICD-10-CM | POA: Diagnosis not present

## 2017-01-16 DIAGNOSIS — F603 Borderline personality disorder: Secondary | ICD-10-CM | POA: Insufficient documentation

## 2017-01-19 DIAGNOSIS — T424X2S Poisoning by benzodiazepines, intentional self-harm, sequela: Secondary | ICD-10-CM | POA: Diagnosis not present

## 2017-01-19 DIAGNOSIS — R4182 Altered mental status, unspecified: Secondary | ICD-10-CM | POA: Diagnosis not present

## 2017-01-19 DIAGNOSIS — M6281 Muscle weakness (generalized): Secondary | ICD-10-CM | POA: Diagnosis not present

## 2017-01-19 DIAGNOSIS — S299XXA Unspecified injury of thorax, initial encounter: Secondary | ICD-10-CM | POA: Diagnosis not present

## 2017-01-19 DIAGNOSIS — S0990XA Unspecified injury of head, initial encounter: Secondary | ICD-10-CM | POA: Diagnosis not present

## 2017-04-07 ENCOUNTER — Ambulatory Visit: Payer: Medicare Other | Admitting: Neurology

## 2017-05-02 DIAGNOSIS — M5432 Sciatica, left side: Secondary | ICD-10-CM | POA: Diagnosis not present

## 2017-05-02 DIAGNOSIS — Z23 Encounter for immunization: Secondary | ICD-10-CM | POA: Diagnosis not present

## 2017-05-12 DIAGNOSIS — M545 Low back pain: Secondary | ICD-10-CM | POA: Diagnosis not present

## 2017-05-12 DIAGNOSIS — M5430 Sciatica, unspecified side: Secondary | ICD-10-CM | POA: Diagnosis not present

## 2017-05-14 DIAGNOSIS — S81819A Laceration without foreign body, unspecified lower leg, initial encounter: Secondary | ICD-10-CM | POA: Diagnosis not present

## 2017-05-14 DIAGNOSIS — F4310 Post-traumatic stress disorder, unspecified: Secondary | ICD-10-CM | POA: Diagnosis not present

## 2017-05-14 DIAGNOSIS — R937 Abnormal findings on diagnostic imaging of other parts of musculoskeletal system: Secondary | ICD-10-CM | POA: Diagnosis not present

## 2017-05-14 DIAGNOSIS — F329 Major depressive disorder, single episode, unspecified: Secondary | ICD-10-CM | POA: Diagnosis not present

## 2017-05-14 DIAGNOSIS — F332 Major depressive disorder, recurrent severe without psychotic features: Secondary | ICD-10-CM | POA: Diagnosis not present

## 2017-05-14 DIAGNOSIS — S71101A Unspecified open wound, right thigh, initial encounter: Secondary | ICD-10-CM | POA: Diagnosis not present

## 2017-05-14 DIAGNOSIS — R569 Unspecified convulsions: Secondary | ICD-10-CM | POA: Diagnosis not present

## 2017-05-14 DIAGNOSIS — S71112A Laceration without foreign body, left thigh, initial encounter: Secondary | ICD-10-CM | POA: Diagnosis not present

## 2017-05-14 DIAGNOSIS — F3160 Bipolar disorder, current episode mixed, unspecified: Secondary | ICD-10-CM | POA: Diagnosis present

## 2017-05-14 DIAGNOSIS — S71102A Unspecified open wound, left thigh, initial encounter: Secondary | ICD-10-CM | POA: Diagnosis not present

## 2017-05-14 DIAGNOSIS — G40909 Epilepsy, unspecified, not intractable, without status epilepticus: Secondary | ICD-10-CM | POA: Diagnosis not present

## 2017-05-14 DIAGNOSIS — F3163 Bipolar disorder, current episode mixed, severe, without psychotic features: Secondary | ICD-10-CM | POA: Diagnosis not present

## 2017-05-14 DIAGNOSIS — R45851 Suicidal ideations: Secondary | ICD-10-CM | POA: Diagnosis not present

## 2017-05-14 DIAGNOSIS — T1491XA Suicide attempt, initial encounter: Secondary | ICD-10-CM | POA: Diagnosis not present

## 2017-05-14 DIAGNOSIS — F603 Borderline personality disorder: Secondary | ICD-10-CM | POA: Diagnosis not present

## 2017-05-14 DIAGNOSIS — S71111A Laceration without foreign body, right thigh, initial encounter: Secondary | ICD-10-CM | POA: Diagnosis not present

## 2017-05-14 DIAGNOSIS — X838XXA Intentional self-harm by other specified means, initial encounter: Secondary | ICD-10-CM | POA: Diagnosis not present

## 2017-05-14 DIAGNOSIS — S21002A Unspecified open wound of left breast, initial encounter: Secondary | ICD-10-CM | POA: Diagnosis not present

## 2017-05-14 DIAGNOSIS — S299XXA Unspecified injury of thorax, initial encounter: Secondary | ICD-10-CM | POA: Diagnosis not present

## 2017-05-14 DIAGNOSIS — Z888 Allergy status to other drugs, medicaments and biological substances status: Secondary | ICD-10-CM | POA: Diagnosis not present

## 2017-05-14 DIAGNOSIS — Z87891 Personal history of nicotine dependence: Secondary | ICD-10-CM | POA: Diagnosis not present

## 2017-05-14 DIAGNOSIS — T1490XA Injury, unspecified, initial encounter: Secondary | ICD-10-CM | POA: Diagnosis not present

## 2017-05-15 DIAGNOSIS — F3160 Bipolar disorder, current episode mixed, unspecified: Secondary | ICD-10-CM | POA: Insufficient documentation

## 2017-05-19 DIAGNOSIS — S71119A Laceration without foreign body, unspecified thigh, initial encounter: Secondary | ICD-10-CM | POA: Insufficient documentation

## 2017-06-03 ENCOUNTER — Encounter (INDEPENDENT_AMBULATORY_CARE_PROVIDER_SITE_OTHER): Payer: Self-pay

## 2017-06-03 ENCOUNTER — Ambulatory Visit (INDEPENDENT_AMBULATORY_CARE_PROVIDER_SITE_OTHER): Payer: Medicare Other | Admitting: Neurology

## 2017-06-03 ENCOUNTER — Encounter: Payer: Self-pay | Admitting: Neurology

## 2017-06-03 VITALS — BP 146/79 | HR 79 | Ht 66.0 in | Wt 128.5 lb

## 2017-06-03 DIAGNOSIS — Z79899 Other long term (current) drug therapy: Secondary | ICD-10-CM | POA: Diagnosis not present

## 2017-06-03 DIAGNOSIS — G43009 Migraine without aura, not intractable, without status migrainosus: Secondary | ICD-10-CM

## 2017-06-03 DIAGNOSIS — G40209 Localization-related (focal) (partial) symptomatic epilepsy and epileptic syndromes with complex partial seizures, not intractable, without status epilepticus: Secondary | ICD-10-CM | POA: Diagnosis not present

## 2017-06-03 MED ORDER — NEXIUM 40 MG PO CPDR: 40.0000 mg | DELAYED_RELEASE_CAPSULE | Freq: Every day | ORAL | 11 refills | Status: DC | PRN

## 2017-06-03 MED ORDER — SUMATRIPTAN SUCCINATE 100 MG PO TABS: 100.0000 mg | ORAL_TABLET | Freq: Once | ORAL | 11 refills | Status: DC | PRN

## 2017-06-03 MED ORDER — OXCARBAZEPINE 150 MG PO TABS: 450.0000 mg | ORAL_TABLET | Freq: Two times a day (BID) | ORAL | 11 refills | Status: DC

## 2017-06-03 MED ORDER — TOPIRAMATE ER 100 MG PO CAP24: 300.0000 mg | ORAL_CAPSULE | Freq: Every day | ORAL | 4 refills | Status: DC

## 2017-06-03 MED ORDER — CLOBAZAM 10 MG PO TABS: 20.0000 mg | ORAL_TABLET | Freq: Every day | ORAL | 5 refills | Status: DC

## 2017-06-03 MED ORDER — DIVALPROEX SODIUM ER 500 MG PO TB24: 1500.0000 mg | ORAL_TABLET | Freq: Every day | ORAL | 4 refills | Status: DC
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
## 2017-06-04 ENCOUNTER — Ambulatory Visit (INDEPENDENT_AMBULATORY_CARE_PROVIDER_SITE_OTHER): Payer: Medicare Other | Admitting: Neurology

## 2017-06-04 DIAGNOSIS — G40209 Localization-related (focal) (partial) symptomatic epilepsy and epileptic syndromes with complex partial seizures, not intractable, without status epilepticus: Secondary | ICD-10-CM | POA: Diagnosis not present

## 2017-06-04 DIAGNOSIS — G43009 Migraine without aura, not intractable, without status migrainosus: Secondary | ICD-10-CM

## 2017-06-04 DIAGNOSIS — Z79899 Other long term (current) drug therapy: Secondary | ICD-10-CM

## 2017-06-05 LAB — COMPREHENSIVE METABOLIC PANEL
ALK PHOS: 145 IU/L — AB (ref 39–117)
ALT: 6 IU/L (ref 0–32)
AST: 16 IU/L (ref 0–40)
Albumin/Globulin Ratio: 2.2 (ref 1.2–2.2)
Albumin: 4.7 g/dL (ref 3.5–5.5)
BUN/Creatinine Ratio: 17 (ref 9–23)
BUN: 16 mg/dL (ref 6–24)
Bilirubin Total: 0.2 mg/dL (ref 0.0–1.2)
CO2: 18 mmol/L — AB (ref 20–29)
CREATININE: 0.95 mg/dL (ref 0.57–1.00)
Calcium: 9.4 mg/dL (ref 8.7–10.2)
Chloride: 105 mmol/L (ref 96–106)
GFR calc Af Amer: 80 mL/min/{1.73_m2} (ref >59)
GFR calc non Af Amer: 70 mL/min/{1.73_m2} (ref >59)
GLUCOSE: 87 mg/dL (ref 65–99)
Globulin, Total: 2.1 g/dL (ref 1.5–4.5)
Potassium: 3.9 mmol/L (ref 3.5–5.2)
Sodium: 138 mmol/L (ref 134–144)
Total Protein: 6.8 g/dL (ref 6.0–8.5)

## 2017-06-05 LAB — CBC WITH DIFFERENTIAL/PLATELET
BASOS ABS: 0 10*3/uL (ref 0.0–0.2)
Basos: 0 %
EOS (ABSOLUTE): 0 10*3/uL (ref 0.0–0.4)
Eos: 1 %
HEMOGLOBIN: 11.9 g/dL (ref 11.1–15.9)
Hematocrit: 38.2 % (ref 34.0–46.6)
Immature Grans (Abs): 0 10*3/uL (ref 0.0–0.1)
Immature Granulocytes: 0 %
LYMPHS ABS: 2.7 10*3/uL (ref 0.7–3.1)
Lymphs: 41 %
MCH: 34.3 pg — ABNORMAL HIGH (ref 26.6–33.0)
MCHC: 31.2 g/dL — AB (ref 31.5–35.7)
MCV: 110 fL — ABNORMAL HIGH (ref 79–97)
MONOCYTES: 10 %
MONOS ABS: 0.7 10*3/uL (ref 0.1–0.9)
NEUTROS PCT: 48 %
Neutrophils Absolute: 3.1 10*3/uL (ref 1.4–7.0)
Platelets: 221 10*3/uL (ref 150–379)
RBC: 3.47 x10E6/uL — AB (ref 3.77–5.28)
RDW: 14.9 % (ref 12.3–15.4)
WBC: 6.5 10*3/uL (ref 3.4–10.8)

## 2017-06-05 LAB — VALPROIC ACID LEVEL: Valproic Acid Lvl: 87 ug/mL (ref 50–100)

## 2017-06-05 LAB — TSH: TSH: 2.21 u[IU]/mL (ref 0.450–4.500)

## 2017-06-05 LAB — 10-HYDROXYCARBAZEPINE: Oxcarbazepine SerPl-Mcnc: 10 ug/mL (ref 10–35)

## 2017-06-05 LAB — TOPIRAMATE LEVEL: TOPIRAMATE LVL: 6.7 ug/mL (ref 2.0–25.0)

## 2017-06-05 NOTE — Procedures (Signed)
   HISTORY: 51 year old female, with history of substance abuse, alcohol abuse, frequent partial seizure, severe depression, polypharmacy treatment  TECHNIQUE:  16 channel EEG was performed based on standard 10-16 international system. One channel was dedicated to EKG, which has demonstrates normal sinus rhythm of 66 beats per minutes.  Upon awakening, the posterior background activity was well-developed, with mixed alpha and beta range activity, small amplitude reactive to eye opening and closure.  There was no evidence of epileptiform discharge.  There was frequent muscle artifact,  Photic stimulation was performed, which induced a symmetric photic driving.  Hyperventilation was performed, there was no abnormality elicit.  No sleep was achieved.  CONCLUSION: This is a  normal awake EEG.  There is no electrodiagnostic evidence of epileptiform discharge, the increased beta frequency is often associated with benzodiazepine use.  Marcial Pacas, M.D. Ph.D.  [facility] [place], [place] [address] Phone: [phone] Fax:      [phone]

## 2017-06-06 ENCOUNTER — Telehealth: Payer: Self-pay | Admitting: Neurology

## 2017-06-06 DIAGNOSIS — F332 Major depressive disorder, recurrent severe without psychotic features: Secondary | ICD-10-CM | POA: Diagnosis not present

## 2017-06-06 NOTE — Telephone Encounter (Signed)
Please call patient, laboratory evaluation showed mild abnormal liver functional test, with mild elevated alkaline phosphate,  Rest of the laboratory evaluations were normal, antiepileptic medications were within therapeutic range

## 2017-06-11 ENCOUNTER — Telehealth: Payer: Self-pay | Admitting: Neurology

## 2017-06-11 NOTE — Telephone Encounter (Signed)
Error

## 2017-06-11 NOTE — Telephone Encounter (Signed)
Spoke to patient's husband on DPR - he is aware of results and will give them to the patient.  Provided our call back number if she has any questions.

## 2017-06-12 DIAGNOSIS — F609 Personality disorder, unspecified: Secondary | ICD-10-CM | POA: Diagnosis not present

## 2017-06-12 DIAGNOSIS — F339 Major depressive disorder, recurrent, unspecified: Secondary | ICD-10-CM | POA: Diagnosis not present

## 2017-08-04 ENCOUNTER — Other Ambulatory Visit: Payer: Self-pay

## 2017-08-04 MED ORDER — ESOMEPRAZOLE MAGNESIUM 40 MG PO CPDR: 40.0000 mg | DELAYED_RELEASE_CAPSULE | Freq: Every day | ORAL | 1 refills | Status: DC

## 2017-08-04 NOTE — Telephone Encounter (Signed)
Faxed received from [facility] that brand name Nexium is not covered by insurance. Sent new Rx for generic

## 2017-08-06 ENCOUNTER — Telehealth: Payer: Self-pay | Admitting: Neurology

## 2017-08-06 NOTE — Telephone Encounter (Signed)
Katie at [facility] is calling stating esomeprazole (NEXIUM) 40 MG capsule needs PA.

## 2017-08-06 NOTE — Telephone Encounter (Signed)
Returned call to pharmacy - they informed me that generic Nexium will require PA.  She is faxing over this information.

## 2017-08-06 NOTE — Telephone Encounter (Signed)
PA approved by Holland Falling [phone]) through 12/31-2019.  IF#[id number]. Member [id number].  Pharmacy notified.

## 2017-09-02 ENCOUNTER — Ambulatory Visit: Payer: Medicare Other | Admitting: Neurology

## 2017-09-02 DIAGNOSIS — F332 Major depressive disorder, recurrent severe without psychotic features: Secondary | ICD-10-CM | POA: Diagnosis not present

## 2017-09-24 DIAGNOSIS — F609 Personality disorder, unspecified: Secondary | ICD-10-CM | POA: Diagnosis not present

## 2017-09-25 DIAGNOSIS — R569 Unspecified convulsions: Secondary | ICD-10-CM | POA: Diagnosis not present

## 2017-09-25 DIAGNOSIS — R4182 Altered mental status, unspecified: Secondary | ICD-10-CM | POA: Diagnosis not present

## 2017-11-27 ENCOUNTER — Telehealth: Payer: Self-pay | Admitting: *Deleted

## 2017-11-27 NOTE — Telephone Encounter (Signed)
Spoke to patient - rescheduled appt on 12/10/17.  Also, notified the VNS rep of new time.

## 2017-11-27 NOTE — Telephone Encounter (Signed)
Patient canceled her appointment for Monday June 17 with the automated system in error.  The appt has been filled.  She wants to be seen asap.  The next available is in September.  Wants to know if there is anything the RN can schedule sooner?  Please call.

## 2017-12-01 ENCOUNTER — Ambulatory Visit: Payer: Medicare Other | Admitting: Neurology

## 2017-12-10 ENCOUNTER — Encounter: Payer: Self-pay | Admitting: Neurology

## 2017-12-10 ENCOUNTER — Ambulatory Visit (INDEPENDENT_AMBULATORY_CARE_PROVIDER_SITE_OTHER): Payer: Medicare Other | Admitting: Neurology

## 2017-12-10 ENCOUNTER — Telehealth: Payer: Self-pay | Admitting: Neurology

## 2017-12-10 VITALS — BP 128/79 | HR 77 | Ht 66.0 in | Wt 136.5 lb

## 2017-12-10 DIAGNOSIS — G40209 Localization-related (focal) (partial) symptomatic epilepsy and epileptic syndromes with complex partial seizures, not intractable, without status epilepticus: Secondary | ICD-10-CM | POA: Diagnosis not present

## 2017-12-10 DIAGNOSIS — F332 Major depressive disorder, recurrent severe without psychotic features: Secondary | ICD-10-CM | POA: Diagnosis not present

## 2017-12-10 DIAGNOSIS — G40219 Localization-related (focal) (partial) symptomatic epilepsy and epileptic syndromes with complex partial seizures, intractable, without status epilepticus: Secondary | ICD-10-CM | POA: Diagnosis not present

## 2017-12-10 DIAGNOSIS — G43009 Migraine without aura, not intractable, without status migrainosus: Secondary | ICD-10-CM | POA: Diagnosis not present

## 2017-12-10 MED ORDER — IBUPROFEN 800 MG PO TABS: 800.0000 mg | ORAL_TABLET | Freq: Three times a day (TID) | ORAL | 3 refills | Status: DC | PRN

## 2017-12-10 MED ORDER — CLOBAZAM 10 MG PO TABS: 10.0000 mg | ORAL_TABLET | Freq: Two times a day (BID) | ORAL | 5 refills | Status: DC

## 2017-12-10 MED ORDER — OXCARBAZEPINE 150 MG PO TABS: 450.0000 mg | ORAL_TABLET | Freq: Two times a day (BID) | ORAL | 11 refills | Status: DC

## 2017-12-10 MED ORDER — SUMATRIPTAN SUCCINATE 100 MG PO TABS: 100.0000 mg | ORAL_TABLET | Freq: Once | ORAL | 11 refills | Status: DC | PRN

## 2017-12-10 MED ORDER — DIVALPROEX SODIUM ER 500 MG PO TB24: 1500.0000 mg | ORAL_TABLET | Freq: Every day | ORAL | 4 refills | Status: DC
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
## 2017-12-10 NOTE — Telephone Encounter (Signed)
1 month botox

## 2017-12-11 NOTE — Telephone Encounter (Signed)
I called and scheduled the patient for August the 7th at [place].

## 2017-12-15 ENCOUNTER — Telehealth: Payer: Self-pay | Admitting: Neurology

## 2017-12-15 MED ORDER — FREMANEZUMAB-VFRM 225 MG/1.5ML ~~LOC~~ SOSY: 225.0000 mg | PREFILLED_SYRINGE | SUBCUTANEOUS | 11 refills | Status: DC

## 2017-12-15 NOTE — Addendum Note (Signed)
Addended by: Noberto Retort C on: 12/15/2017 03:51 PM   Modules accepted: Orders

## 2017-12-15 NOTE — Telephone Encounter (Signed)
Pt requesting a call to discuss trying medication appose to trying botox stating she can't afford 250 dollar every 3 months. Please call to advise

## 2017-12-15 NOTE — Telephone Encounter (Signed)
Dr. [name] has reviewed patient's chart.  Per Dr. Windell Moulding, she will provide prescription for Ajovy 225mg , one injection per month.  I have spoken with the patient and she is in agreement to this recommended therapy.  New rx has been sent to her pharmacy.

## 2017-12-15 NOTE — Telephone Encounter (Signed)
She is currently taking Trokendi XR 300mg  at bedtime.  Estimates still having two migraine days per week.  She is unable to afford the co-pay to proceed with Botox injections.  She is requesting an alternative to Botox.

## 2017-12-16 ENCOUNTER — Telehealth: Payer: Self-pay | Admitting: *Deleted

## 2017-12-16 NOTE — Telephone Encounter (Signed)
PA approved for Ajovy by [place] Falling [phone]).  Valid through 06/16/2018.  Member GC#[id number].

## 2017-12-17 NOTE — Telephone Encounter (Signed)
Noted, thank you

## 2017-12-22 ENCOUNTER — Other Ambulatory Visit: Payer: Self-pay | Admitting: Neurology

## 2017-12-23 ENCOUNTER — Other Ambulatory Visit: Payer: Self-pay | Admitting: Neurology

## 2018-01-09 DIAGNOSIS — F609 Personality disorder, unspecified: Secondary | ICD-10-CM | POA: Diagnosis not present

## 2018-01-20 ENCOUNTER — Telehealth: Payer: Self-pay | Admitting: *Deleted

## 2018-01-20 NOTE — Telephone Encounter (Signed)
PA started for Trokendi XR through covermymeds (key#[id number]).  [employer] - pt FF#[id number]  Decision pending.

## 2018-01-21 ENCOUNTER — Ambulatory Visit: Payer: Medicare Other | Admitting: Neurology

## 2018-01-21 NOTE — Telephone Encounter (Signed)
PA approved at [facility] [phone]).  Valid through 06/16/2018.

## 2018-01-23 ENCOUNTER — Telehealth: Payer: Self-pay | Admitting: *Deleted

## 2018-01-23 NOTE — Telephone Encounter (Signed)
PA started for Trokendi XR through covermymeds (key#[id number]).  [employer] - pt TX#[id number]  Decision pending.

## 2018-03-23 ENCOUNTER — Ambulatory Visit (INDEPENDENT_AMBULATORY_CARE_PROVIDER_SITE_OTHER): Payer: Medicare Other | Admitting: Neurology

## 2018-03-23 ENCOUNTER — Encounter: Payer: Self-pay | Admitting: Neurology

## 2018-03-23 VITALS — BP 134/83 | HR 73 | Ht 66.0 in | Wt 149.0 lb

## 2018-03-23 DIAGNOSIS — G40209 Localization-related (focal) (partial) symptomatic epilepsy and epileptic syndromes with complex partial seizures, not intractable, without status epilepticus: Secondary | ICD-10-CM

## 2018-03-23 DIAGNOSIS — Z79899 Other long term (current) drug therapy: Secondary | ICD-10-CM

## 2018-03-23 DIAGNOSIS — F322 Major depressive disorder, single episode, severe without psychotic features: Secondary | ICD-10-CM | POA: Diagnosis not present

## 2018-03-23 DIAGNOSIS — G40219 Localization-related (focal) (partial) symptomatic epilepsy and epileptic syndromes with complex partial seizures, intractable, without status epilepticus: Secondary | ICD-10-CM | POA: Diagnosis not present

## 2018-03-23 DIAGNOSIS — G43009 Migraine without aura, not intractable, without status migrainosus: Secondary | ICD-10-CM

## 2018-03-23 MED ORDER — TOPIRAMATE ER 100 MG PO CAP24: 300.0000 mg | ORAL_CAPSULE | Freq: Every day | ORAL | 4 refills | Status: DC

## 2018-03-23 MED ORDER — OXCARBAZEPINE 150 MG PO TABS: 450.0000 mg | ORAL_TABLET | Freq: Two times a day (BID) | ORAL | 11 refills | Status: DC

## 2018-03-23 MED ORDER — CLOBAZAM 10 MG PO TABS: 10.0000 mg | ORAL_TABLET | Freq: Two times a day (BID) | ORAL | 5 refills | Status: DC
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
## 2018-03-25 ENCOUNTER — Telehealth: Payer: Self-pay | Admitting: Neurology

## 2018-03-25 LAB — TOPIRAMATE LEVEL: Topiramate Lvl: 8.6 ug/mL (ref 2.0–25.0)

## 2018-03-25 LAB — VALPROIC ACID LEVEL: Valproic Acid Lvl: 114 ug/mL — ABNORMAL HIGH (ref 50–100)

## 2018-03-25 LAB — 10-HYDROXYCARBAZEPINE: OXCARBAZEPINE SERPL-MCNC: NOT DETECTED ug/mL (ref 10–35)

## 2018-03-25 NOTE — Telephone Encounter (Signed)
Spoke to patient who states she was confused about her medications.  I asked her to get her bottles out to review the prescriptions together.  She verbalized understanding to take the following:  1)Trokendi XR 100mg , 3 capsules at qhs 2) Depakote ER 500mg , 3 tablets at qhs 3) Onfi 10mg , 1 tablet BID 4) Trileptal 150mg , 3 tablets BID

## 2018-03-25 NOTE — Telephone Encounter (Signed)
Lab evaluation showed trileptal level was non detectable,  VPA  114, topamax 8.6  She is not compliance with her medications, we need to know what medication she is taking to be able to help her better, it is ok to let us know her concern and potential medication side effect.

## 2018-04-30 DIAGNOSIS — M25562 Pain in left knee: Secondary | ICD-10-CM | POA: Diagnosis not present

## 2018-04-30 DIAGNOSIS — Z6826 Body mass index (BMI) 26.0-26.9, adult: Secondary | ICD-10-CM | POA: Diagnosis not present

## 2018-06-26 DIAGNOSIS — G2581 Restless legs syndrome: Secondary | ICD-10-CM | POA: Diagnosis not present

## 2018-07-07 ENCOUNTER — Telehealth: Payer: Self-pay | Admitting: *Deleted

## 2018-07-07 NOTE — Telephone Encounter (Signed)
Spoke with [facility] PA Dept. [phone]). Pt's Ajovy approval from [place] carried over to them. No new PA needed. Approval dates: 07/06/18-06/16/38. PA# [id number]. Ticket# [id number]

## 2018-07-23 ENCOUNTER — Other Ambulatory Visit: Payer: Self-pay | Admitting: Neurology

## 2018-07-27 DIAGNOSIS — F332 Major depressive disorder, recurrent severe without psychotic features: Secondary | ICD-10-CM | POA: Diagnosis not present

## 2018-08-04 ENCOUNTER — Telehealth: Payer: Self-pay | Admitting: Neurology

## 2018-08-04 MED ORDER — ESOMEPRAZOLE MAGNESIUM 40 MG PO CPDR: DELAYED_RELEASE_CAPSULE | ORAL | 0 refills | Status: DC

## 2018-08-04 NOTE — Telephone Encounter (Signed)
Dr. [name] temporarily provided her refills of Nexium while she was looking for a new PCP.  She has just become established with a new PCP and has an upcoming visit.  She is requesting one additional refill until she can discuss this medication with her PCP.  90-day supply with no additional refill sent to the pharmacy.

## 2018-08-04 NOTE — Telephone Encounter (Signed)
Pt is needing a refill for her esomeprazole (NEXIUM) 40 MG capsule and she was informed by the pharmacist that it was denied. Please advise.

## 2018-09-15 ENCOUNTER — Other Ambulatory Visit: Payer: Self-pay

## 2018-09-15 ENCOUNTER — Ambulatory Visit (INDEPENDENT_AMBULATORY_CARE_PROVIDER_SITE_OTHER): Payer: Medicare Other | Admitting: Neurology

## 2018-09-15 ENCOUNTER — Telehealth: Payer: Self-pay | Admitting: Neurology

## 2018-09-15 DIAGNOSIS — G40209 Localization-related (focal) (partial) symptomatic epilepsy and epileptic syndromes with complex partial seizures, not intractable, without status epilepticus: Secondary | ICD-10-CM

## 2018-09-15 DIAGNOSIS — F05 Delirium due to known physiological condition: Secondary | ICD-10-CM | POA: Diagnosis not present

## 2018-09-15 DIAGNOSIS — Z79899 Other long term (current) drug therapy: Secondary | ICD-10-CM | POA: Diagnosis not present

## 2018-09-15 NOTE — Telephone Encounter (Signed)
Please call her husband at [phone] on September 16 2018 about EEG schedule after you find out the time of EEG tech.  I also ask her to come in for lab evaluation on April 1.

## 2018-09-15 NOTE — Telephone Encounter (Signed)
Pts husband called to inform us that the pt had a seizure last Thursday and has not "snapped" out of it. She is not able to feed herself, walk or take herself to the bathroom. Pt also does not recognize where she is. He would like to know if the pt can be seen sooner than her appt on 10/13/18. Please advise.
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
## 2018-09-16 ENCOUNTER — Telehealth: Payer: Self-pay | Admitting: Neurology

## 2018-09-16 ENCOUNTER — Ambulatory Visit (INDEPENDENT_AMBULATORY_CARE_PROVIDER_SITE_OTHER): Payer: Medicare Other

## 2018-09-16 ENCOUNTER — Other Ambulatory Visit: Payer: Self-pay

## 2018-09-16 ENCOUNTER — Encounter: Payer: Self-pay | Admitting: Neurology

## 2018-09-16 DIAGNOSIS — F05 Delirium due to known physiological condition: Secondary | ICD-10-CM

## 2018-09-16 DIAGNOSIS — G40209 Localization-related (focal) (partial) symptomatic epilepsy and epileptic syndromes with complex partial seizures, not intractable, without status epilepticus: Secondary | ICD-10-CM

## 2018-09-16 NOTE — Procedures (Signed)
    History: Kim [name] is a 53 year old patient with a history of intractable seizures.  The patient is no longer driving, she takes Depakote, Topamax and she is also on diazepam.  She has a vagal nerve stimulator in place.  She is being evaluated for her seizures.  This is a routine EEG.  No skull defects are noted.  Medications include Trokendi, Depakote, Onfi, and Trileptal.  EEG classification: Dysrhythmia grade 2, delta grade 2  Description of the recording: The background rhythms of this recording consists of a very poorly organized background activity of 6 to 7 Hz activity.  Particularly early on, there appears to be head movement artifact that recurs throughout the recording.  Muscle artifact is seen.  There appears to be significant background delta activity in the 2 to 3 Hz range that is seen maximally posteriorly.  Photic stimulation is performed, no clear photic driving response is seen.  At no time does there appear to be evidence of spike or spike-wave discharges or evidence of focal slowing.  Hyperventilation was not performed.  EKG monitor shows no evidence of cardiac rhythm abnormalities with a heart rate of 72.  Impression: This is an abnormal EEG recording secondary to diffuse background slowing in the delta and theta frequency range.  This study is consistent with a toxic metabolic encephalopathy, no clear epileptiform discharges are seen, but this could represent a postictal recording.  Clinical correlation is required.

## 2018-09-16 NOTE — Telephone Encounter (Signed)
09/16/18 Pt. Is going to call back to schedule 4 wk f/u

## 2018-09-16 NOTE — Telephone Encounter (Signed)
-----   Message from Marcial Pacas, MD sent at 09/16/2018  8:57 AM EDT ----- Follow up with me in 4 weeks, virtual visit

## 2018-09-16 NOTE — Telephone Encounter (Signed)
I spoke to the patient's husband and he will have her here at 10am.

## 2018-09-17 ENCOUNTER — Telehealth: Payer: Self-pay | Admitting: Neurology

## 2018-09-17 DIAGNOSIS — F05 Delirium due to known physiological condition: Secondary | ICD-10-CM

## 2018-09-17 MED ORDER — DIAZEPAM 5 MG PO TABS: ORAL_TABLET | ORAL | 0 refills | Status: DC

## 2018-09-17 NOTE — Telephone Encounter (Addendum)
I failed to reach [place] and Cristie Hem, left message for them,   I was able to talk with her daughter Theadora Rama  EEG showed generalized slowing, this could be due to medication side effect, no evidence of seizure activity.  Depakote level is elevated 147, but it is not trough level, will decrease depakote DR 500mg  3 tabs bid (from 4 tabs bid)  Brandi reported patient has worsening confusion since her possible seizure spells on September 10, 2018 evening, that morning, patient was able to babysit her grandchildren,  Now she can barely walk, not able to eat, there was no family members to attend her at home  1 MRI of the brain urgent referral to [facility] 2, home health agent 3, decreased Depakote DR 500 mg 2 tablets twice a day 4, Valium challenge, 5 mg as needed 1 tablets may repeat once in 30 minutes if needed to make sure patient is not in subclinical status (EEG showed no epileptiform discharge) 5. Keep Virtual visit on April 28th.

## 2018-09-17 NOTE — Telephone Encounter (Signed)
Pt's husband [name]/DPR is requesting EEG results.

## 2018-09-17 NOTE — Telephone Encounter (Signed)
Her husband, Cristie Hem, can be reached at [phone].  He is aware the call will come up as a blocked number.  He knows Dr. [name] has spoken with their daughter but he would also like a call.

## 2018-09-18 ENCOUNTER — Telehealth: Payer: Self-pay | Admitting: Neurology

## 2018-09-18 NOTE — Telephone Encounter (Signed)
I talked with her husband Cristie Hem, patient remains slow, confused, has unsteady gait.  I went over eeg result with him, diffuse slowing, metabolic toxic, vs post-ictal.  I have encouraged to try valium 5mg  challenge, to rule out subclinical status.  If patient remain confused, not be able to bounce back to baseline, may contact our office again, even consider ED visit

## 2018-09-21 ENCOUNTER — Telehealth: Payer: Self-pay | Admitting: Neurology

## 2018-09-21 DIAGNOSIS — G40909 Epilepsy, unspecified, not intractable, without status epilepticus: Secondary | ICD-10-CM | POA: Diagnosis not present

## 2018-09-21 DIAGNOSIS — G4089 Other seizures: Secondary | ICD-10-CM | POA: Diagnosis not present

## 2018-09-21 DIAGNOSIS — F17210 Nicotine dependence, cigarettes, uncomplicated: Secondary | ICD-10-CM | POA: Diagnosis not present

## 2018-09-21 DIAGNOSIS — Z79899 Other long term (current) drug therapy: Secondary | ICD-10-CM | POA: Diagnosis not present

## 2018-09-21 DIAGNOSIS — R443 Hallucinations, unspecified: Secondary | ICD-10-CM | POA: Diagnosis not present

## 2018-09-21 DIAGNOSIS — F603 Borderline personality disorder: Secondary | ICD-10-CM | POA: Diagnosis not present
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
## 2018-09-21 NOTE — Telephone Encounter (Signed)
Medicare no auth. pt is scheduled at [facility] for 09/21/18 arrival time is 11 AM pt husband Cristie Hem is aware. Their phone number is [phone] fax # [phone]. They have Dr. [name] cell phone number for call report. Dr. [name] is aware.
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
## 2018-09-22 ENCOUNTER — Inpatient Hospital Stay
Admission: AD | Admit: 2018-09-22 | Discharge: 2018-09-27 | DRG: 885 | Disposition: A | Discharge status: Other Acute Inpatient Hospital | Payer: Medicare Other | Source: Intra-hospital | Attending: Psychiatry | Admitting: Psychiatry

## 2018-09-22 ENCOUNTER — Other Ambulatory Visit: Payer: Self-pay | Admitting: Neurology

## 2018-09-22 DIAGNOSIS — Z8249 Family history of ischemic heart disease and other diseases of the circulatory system: Secondary | ICD-10-CM

## 2018-09-22 DIAGNOSIS — R443 Hallucinations, unspecified: Secondary | ICD-10-CM | POA: Diagnosis not present

## 2018-09-22 DIAGNOSIS — F603 Borderline personality disorder: Secondary | ICD-10-CM | POA: Diagnosis not present

## 2018-09-22 DIAGNOSIS — Z8349 Family history of other endocrine, nutritional and metabolic diseases: Secondary | ICD-10-CM | POA: Diagnosis not present

## 2018-09-22 DIAGNOSIS — F333 Major depressive disorder, recurrent, severe with psychotic symptoms: Principal | ICD-10-CM | POA: Diagnosis present

## 2018-09-22 DIAGNOSIS — T426X1A Poisoning by other antiepileptic and sedative-hypnotic drugs, accidental (unintentional), initial encounter: Secondary | ICD-10-CM | POA: Diagnosis not present

## 2018-09-22 DIAGNOSIS — F23 Brief psychotic disorder: Secondary | ICD-10-CM | POA: Diagnosis present

## 2018-09-22 DIAGNOSIS — Z791 Long term (current) use of non-steroidal anti-inflammatories (NSAID): Secondary | ICD-10-CM

## 2018-09-22 DIAGNOSIS — R440 Auditory hallucinations: Secondary | ICD-10-CM | POA: Diagnosis not present

## 2018-09-22 DIAGNOSIS — R2681 Unsteadiness on feet: Secondary | ICD-10-CM | POA: Diagnosis present

## 2018-09-22 DIAGNOSIS — Z87891 Personal history of nicotine dependence: Secondary | ICD-10-CM

## 2018-09-22 DIAGNOSIS — F4310 Post-traumatic stress disorder, unspecified: Secondary | ICD-10-CM | POA: Diagnosis present

## 2018-09-22 DIAGNOSIS — R569 Unspecified convulsions: Secondary | ICD-10-CM | POA: Diagnosis not present

## 2018-09-22 DIAGNOSIS — Z79899 Other long term (current) drug therapy: Secondary | ICD-10-CM

## 2018-09-22 DIAGNOSIS — F17210 Nicotine dependence, cigarettes, uncomplicated: Secondary | ICD-10-CM | POA: Diagnosis not present

## 2018-09-22 DIAGNOSIS — Z833 Family history of diabetes mellitus: Secondary | ICD-10-CM

## 2018-09-22 DIAGNOSIS — Z801 Family history of malignant neoplasm of trachea, bronchus and lung: Secondary | ICD-10-CM

## 2018-09-22 DIAGNOSIS — F1290 Cannabis use, unspecified, uncomplicated: Secondary | ICD-10-CM | POA: Diagnosis present

## 2018-09-22 DIAGNOSIS — Z8659 Personal history of other mental and behavioral disorders: Secondary | ICD-10-CM | POA: Diagnosis not present

## 2018-09-22 DIAGNOSIS — Y92239 Unspecified place in hospital as the place of occurrence of the external cause: Secondary | ICD-10-CM | POA: Diagnosis not present

## 2018-09-22 DIAGNOSIS — Z915 Personal history of self-harm: Secondary | ICD-10-CM | POA: Diagnosis not present

## 2018-09-22 DIAGNOSIS — F05 Delirium due to known physiological condition: Secondary | ICD-10-CM | POA: Diagnosis not present

## 2018-09-22 DIAGNOSIS — T426X1D Poisoning by other antiepileptic and sedative-hypnotic drugs, accidental (unintentional), subsequent encounter: Secondary | ICD-10-CM | POA: Diagnosis not present

## 2018-09-22 DIAGNOSIS — G40909 Epilepsy, unspecified, not intractable, without status epilepticus: Secondary | ICD-10-CM | POA: Diagnosis not present

## 2018-09-22 DIAGNOSIS — R441 Visual hallucinations: Secondary | ICD-10-CM | POA: Diagnosis not present

## 2018-09-22 DIAGNOSIS — G40109 Localization-related (focal) (partial) symptomatic epilepsy and epileptic syndromes with simple partial seizures, not intractable, without status epilepticus: Secondary | ICD-10-CM | POA: Diagnosis present

## 2018-09-22 DIAGNOSIS — I10 Essential (primary) hypertension: Secondary | ICD-10-CM | POA: Diagnosis present

## 2018-09-22 DIAGNOSIS — G43909 Migraine, unspecified, not intractable, without status migrainosus: Secondary | ICD-10-CM | POA: Diagnosis present

## 2018-09-22 DIAGNOSIS — Z888 Allergy status to other drugs, medicaments and biological substances status: Secondary | ICD-10-CM | POA: Diagnosis not present

## 2018-09-22 DIAGNOSIS — F329 Major depressive disorder, single episode, unspecified: Secondary | ICD-10-CM | POA: Diagnosis not present

## 2018-09-22 DIAGNOSIS — T426X5A Adverse effect of other antiepileptic and sedative-hypnotic drugs, initial encounter: Secondary | ICD-10-CM | POA: Diagnosis not present

## 2018-09-22 DIAGNOSIS — F1010 Alcohol abuse, uncomplicated: Secondary | ICD-10-CM | POA: Diagnosis present

## 2018-09-22 NOTE — Plan of Care (Signed)
Chart is reviewed.  Patient has a history of major depressive disorder versus bipolar affective disorder, PTSD, alcohol use disorder, marijuana use disorder borderline personality disorder, and a medical history of seizure disorder.  It is uncertain if patient has been compliant with medications.  Patient presented in manic state with acute psychosis threatening self and family.  She has had disorganized thoughts and having visual and auditory hallucinations. Patient has multiple medications, however it is unclear if she has been taking these.  Defer medications to be ordered after medication reconciliation completed by pharmacy. Will ensure patient has as needed medications available for agitation, insomnia, anxiety, seizures. Orders placed for admission. Lavella Hammock, MD
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
## 2018-09-22 NOTE — BH Assessment (Signed)
Patient has been accepted to [facility].  Accepting physician is Dr. Leverne Humbles.  Attending Physician will be Dr. Weber Cooks.  Patient has been assigned to room 325, by [place].  Call report to [phone].  Representative/Transfer Coordinator is Dispensing optician Patient pre-admitted by [facility] Patient Access Meredith Mody S.)  [facility] Staff Clarise Cruz, Disposition Social Worker) made aware of acceptance.

## 2018-09-23 ENCOUNTER — Other Ambulatory Visit: Payer: Self-pay

## 2018-09-23 DIAGNOSIS — F05 Delirium due to known physiological condition: Secondary | ICD-10-CM

## 2018-09-23 DIAGNOSIS — F23 Brief psychotic disorder: Secondary | ICD-10-CM | POA: Diagnosis present

## 2018-09-23 MED ORDER — IBUPROFEN 600 MG PO TABS: 800.0000 mg | ORAL_TABLET | Freq: Three times a day (TID) | ORAL | Status: DC | PRN

## 2018-09-23 MED ORDER — ZIPRASIDONE MESYLATE 20 MG IM SOLR: 20.0000 mg | INTRAMUSCULAR | Status: DC | PRN

## 2018-09-23 MED ORDER — ALUM & MAG HYDROXIDE-SIMETH 200-200-20 MG/5ML PO SUSP: 30.0000 mL | ORAL | Status: DC | PRN

## 2018-09-23 MED ORDER — SUMATRIPTAN SUCCINATE 50 MG PO TABS
100.0000 mg | ORAL_TABLET | Freq: Once | ORAL | Status: DC | PRN
  Administered 2018-09-25: 100 mg via ORAL
  Filled 2018-09-23: qty 2

## 2018-09-23 MED ORDER — CLONIDINE HCL 0.1 MG PO TABS
0.1000 mg | ORAL_TABLET | Freq: Once | ORAL | Status: AC
  Administered 2018-09-23: 0.1 mg via ORAL
  Filled 2018-09-23: qty 1

## 2018-09-23 MED ORDER — CLOBAZAM 10 MG PO TABS
10.0000 mg | ORAL_TABLET | Freq: Two times a day (BID) | ORAL | Status: DC
  Administered 2018-09-23 – 2018-09-27 (×7): 10 mg via ORAL
  Filled 2018-09-23 (×7): qty 1

## 2018-09-23 MED ORDER — DIAZEPAM 2.5 MG RE GEL
2.5000 mg | RECTAL | Status: DC | PRN
  Filled 2018-09-23: qty 2.5

## 2018-09-23 MED ORDER — TOPIRAMATE 100 MG PO TABS
150.0000 mg | ORAL_TABLET | Freq: Two times a day (BID) | ORAL | Status: DC
  Administered 2018-09-23 – 2018-09-27 (×10): 150 mg via ORAL
  Filled 2018-09-23 (×3): qty 1.5
  Filled 2018-09-23 (×2): qty 2
  Filled 2018-09-23 (×2): qty 1.5
  Filled 2018-09-23: qty 2
  Filled 2018-09-23 (×4): qty 1.5
  Filled 2018-09-23: qty 2
  Filled 2018-09-23: qty 1.5
  Filled 2018-09-23 (×3): qty 2

## 2018-09-23 MED ORDER — QUETIAPINE FUMARATE 25 MG PO TABS
50.0000 mg | ORAL_TABLET | Freq: Every day | ORAL | Status: DC
  Administered 2018-09-23 – 2018-09-26 (×4): 50 mg via ORAL
  Filled 2018-09-23 (×4): qty 2

## 2018-09-23 MED ORDER — DIVALPROEX SODIUM ER 500 MG PO TB24
1500.0000 mg | ORAL_TABLET | Freq: Every day | ORAL | Status: DC
  Administered 2018-09-23 – 2018-09-26 (×4): 1500 mg via ORAL
  Filled 2018-09-23 (×4): qty 3

## 2018-09-23 MED ORDER — PANTOPRAZOLE SODIUM 40 MG PO TBEC
40.0000 mg | DELAYED_RELEASE_TABLET | Freq: Every day | ORAL | Status: DC
  Administered 2018-09-23 – 2018-09-27 (×5): 40 mg via ORAL
  Filled 2018-09-23 (×5): qty 1

## 2018-09-23 MED ORDER — OLANZAPINE 5 MG PO TBDP: 5.0000 mg | ORAL_TABLET | Freq: Three times a day (TID) | ORAL | Status: DC | PRN

## 2018-09-23 MED ORDER — ONDANSETRON 4 MG PO TBDP: 4.0000 mg | ORAL_TABLET | Freq: Three times a day (TID) | ORAL | Status: DC | PRN

## 2018-09-23 MED ORDER — OXCARBAZEPINE 150 MG PO TABS
450.0000 mg | ORAL_TABLET | Freq: Two times a day (BID) | ORAL | Status: DC
  Administered 2018-09-23 – 2018-09-27 (×10): 450 mg via ORAL
  Filled 2018-09-23 (×10): qty 3

## 2018-09-23 MED ORDER — TRAZODONE HCL 50 MG PO TABS
50.0000 mg | ORAL_TABLET | Freq: Every evening | ORAL | Status: DC | PRN
  Administered 2018-09-26: 50 mg via ORAL
  Filled 2018-09-23: qty 1

## 2018-09-23 MED ORDER — HYDROXYZINE HCL 50 MG PO TABS
50.0000 mg | ORAL_TABLET | ORAL | Status: DC | PRN
  Administered 2018-09-23: 50 mg via ORAL
  Filled 2018-09-23: qty 1

## 2018-09-23 MED ORDER — MAGNESIUM HYDROXIDE 400 MG/5ML PO SUSP: 30.0000 mL | Freq: Every day | ORAL | Status: DC | PRN

## 2018-09-23 MED ORDER — ACETAMINOPHEN 325 MG PO TABS: 650.0000 mg | ORAL_TABLET | Freq: Four times a day (QID) | ORAL | Status: DC | PRN

## 2018-09-23 MED ORDER — LORAZEPAM 1 MG PO TABS: 1.0000 mg | ORAL_TABLET | ORAL | Status: DC | PRN

## 2018-09-23 NOTE — BHH Group Notes (Signed)
Emotional Regulation 09/23/2018 1PM  Type of Therapy/Topic:  Group Therapy:  Emotion Regulation  Participation Level:  Did Not Attend   Description of Group:   The purpose of this group is to assist patients in learning to regulate negative emotions and experience positive emotions. Patients will be guided to discuss ways in which they have been vulnerable to their negative emotions. These vulnerabilities will be juxtaposed with experiences of positive emotions or situations, and patients will be challenged to use positive emotions to combat negative ones. Special emphasis will be placed on coping with negative emotions in conflict situations, and patients will process healthy conflict resolution skills.  Therapeutic Goals: 1. Patient will identify two positive emotions or experiences to reflect on in order to balance out negative emotions 2. Patient will label two or more emotions that they find the most difficult to experience 3. Patient will demonstrate positive conflict resolution skills through discussion and/or role plays  Summary of Patient Progress:       Therapeutic Modalities:   Cognitive Behavioral Therapy Feelings Identification Dialectical Behavioral Therapy   Yvette Rack, LCSW 09/23/2018 2:06 PM
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
## 2018-09-23 NOTE — Progress Notes (Signed)
Recreation Therapy Notes   Date: 09/23/2018  Time: 9:30 am   Location: Craft room   Behavioral response: N/A   Intervention Topic: Happiness  Discussion/Intervention: Patient did not attend group.   Clinical Observations/Feedback:  Patient did not attend group.   Glenda Spelman LRT/CTRS         [name] 09/23/2018 10:50 AM

## 2018-09-23 NOTE — Plan of Care (Signed)
Patient is alert to self, denies SI, and HI. During assessment this morning patient states, " A lady that works here had an affair with my husband, she says she has a baby but [facility]'s [facility] everything is [facility]." Patient has flight of ideas, confusion and  visual hallucinations, Patient  states, "I saw it in my house just a little box."  patient was very quiet when speaking almost in a whisper this morning. Patient blood pressure was rechecked and seems to be trending down. Safety checks to continue Q 15 minutes. Problem: Education: Goal: Knowledge of [facility] Education information/materials will improve Outcome: Not Progressing Goal: Emotional status will improve Outcome: Not Progressing Goal: Mental status will improve Outcome: Not Progressing Goal: Verbalization of understanding the information provided will improve Outcome: Not Progressing   Problem: Activity: Goal: Interest or engagement in activities will improve Outcome: Not Progressing Goal: Sleeping patterns will improve Outcome: Not Progressing

## 2018-09-23 NOTE — Progress Notes (Addendum)
After patient ate breakfast and took medications, patient pretty much spent the majority of the day in bed asleep. Staff asked patient if she wanted lunch patient replied, "No I just want to go home." During 15 minute checks, RN encouraged patient to attend outside group and social work group. Patient declined. Patient agreed for nurse to come around dinner time to make sure patient gets up to eat dinner. 15 minute Safety checks to continue.

## 2018-09-23 NOTE — Tx Team (Signed)
Initial Treatment Plan 09/23/2018 1:53 AM [name] ZOX:[id number]    PATIENT STRESSORS: Health problems Substance abuse   PATIENT STRENGTHS: Communication skills Motivation for treatment/growth   PATIENT IDENTIFIED PROBLEMS: Anxiety   depression  Seizures                  DISCHARGE CRITERIA:  Ability to meet basic life and health needs Adequate post-discharge living arrangements Need for constant or close observation no longer present  PRELIMINARY DISCHARGE PLAN: Outpatient therapy  PATIENT/FAMILY INVOLVEMENT: This treatment plan has been presented to and reviewed with the patient, Kim [name],  The patient and family have been given the opportunity to ask questions and make suggestions.  Oley Balm, RN 09/23/2018, 1:53 AM

## 2018-09-23 NOTE — H&P (Signed)
Psychiatric Admission Assessment Adult  Patient Identification: Kim [name] MRN:  [id number] Date of Evaluation:  09/23/2018 Chief Complaint:  DEPRESSION Principal Diagnosis: MDD (major depressive disorder), recurrent, severe, with psychosis ([place]) Diagnosis:  Principal Problem:   MDD (major depressive disorder), recurrent, severe, with psychosis ([place]) Active Problems:   Marijuana use, episodic   Acute psychosis ([place])  History of Present Illness:  Kim [name] is a 53yo F with a history of major depressive disorder versus bipolar affective disorder, PTSD, alcohol use disorder, marijuana use disorder borderline personality disorder, and a medical history of seizure disorder, who was admitted to [facility] unit for manic state with acute psychosis threatening self and family.    CC: "I want to go home"   HPI:  Per chart, patient presented in ER with manic state and psychosis, threatening family.  She has had disorganized thoughts and having visual and auditory hallucinations. Patient has multiple medications, however it is unclear if she has been taking these.   On interview today patient reports she was sent to the hospital for a "brain test" due to seizures. She reports feeling tired, denies any other complaints, including any mental complaints. She reports "fair" mood, denies feeling hopeless, helpless, denies having any thoughts of harming self or others. She reports feeling anxious due to being here. She reports hearing "voices", denies they commanding her to harm self or others; she would not give details - "they are just my voices". She reports she is compliant with her medications, but cannot remember them. She appears somewhat confused and disoriented in place and date. Her thoughts are raising and she switches topics often, including unrelated stories about her nephews.  Collateral information obtained from patient's husband, Kim [name] [phone]): lately patient appears more  confused and hallucinating. She has occasional seizures and appears confused after a seizure for a day or two. Lately her confusion is not typical for her usual post-ictal. She is seeing things and hearing things that are not there. She is paranoid about family members. Prior to the hospital arrival, she got agitated and yelling "I am going to kill that man" pointing at a space. It is unclear if she was taking her medications (husband usually fills her med-box and she is taking meds from there). Her recent psych medications were: Prozac 30mg  po daily, Oxcarbazepine 300mg  PO TID, Trazodone QHS.   PSYCH ROS: Safety: denies suicidal thoughts, denies homicidal thoughts. Depression: denies sx. Anxiety: moderate anxiety related to hospital admission Psychosis: disorganized behavior, auditory and visual hallucinations. Mania: racing thoughts; flight of ideas; distractibility. Dementia: no symptoms.  Delirium: no symptoms.   ROS: General: negative Neurological: negative at the time; h/o seizures. Psychiatric: see above. All other systems (constit., cardio-vasc., resp., GI, urinary, musculoskeletal, endocr.) have been reviewed and are negative for complaint.   LABs: reviewed  MEDICAL HISTORY: seizure disorder   Past Psychiatric History: Dx: bipolar disorder vs MDD. Had past hospitalizations. Patient denies past suicidal attempts.  Past psych meds: Rexulti. Her recent psych medications were: Prozac 30mg  po daily, Oxcarbazepine 300mg  PO TID, Trazodone QHS.  Is the patient at risk to self? No.  Has the patient been a risk to self in the past 6 months? No.  Has the patient been a risk to self within the distant past? No.  Is the patient a risk to others? No.  Has the patient been a risk to others in the past 6 months? No.  Has the patient been a risk to others within the distant past?  No.   Prior Inpatient Therapy:   Prior Outpatient Therapy:    Alcohol Screening: 1. How often do you have a  drink containing alcohol?: Never 2. How many drinks containing alcohol do you have on a typical day when you are drinking?: 1 or 2 3. How often do you have six or more drinks on one occasion?: Never AUDIT-C Score: 0 4. How often during the last year have you found that you were not able to stop drinking once you had started?: Never 5. How often during the last year have you failed to do what was normally expected from you becasue of drinking?: Less than monthly 6. How often during the last year have you needed a first drink in the morning to get yourself going after a heavy drinking session?: Less than monthly 7. How often during the last year have you had a feeling of guilt of remorse after drinking?: Less than monthly 8. How often during the last year have you been unable to remember what happened the night before because you had been drinking?: Less than monthly 9. Have you or someone else been injured as a result of your drinking?: No 10. Has a relative or friend or a doctor or another health worker been concerned about your drinking or suggested you cut down?: Yes, but not in the last year Alcohol Use Disorder Identification Test Final Score (AUDIT): 6 Alcohol Brief Interventions/Follow-up: AUDIT Score <7 follow-up not indicated Substance Abuse History in the last 12 months:  No. Consequences of Substance Abuse: NA Previous Psychotropic Medications: Yes  Psychological Evaluations: Yes  Past Medical History:  Past Medical History:  Diagnosis Date  . Depression   . Migraines   . Neck pain   . Seizures ([place])     Past Surgical History:  Procedure Laterality Date  . IMPLANTATION VAGAL NERVE STIMULATOR     2001, 2008, 2014   Family History:  Family History  Problem Relation Age of Onset  . Hyperlipidemia Mother   . Hypertension Mother   . Hyperlipidemia Father   . Hypertension Father   . Lung cancer Father   . Diabetes Father   . Heart disease Father    Family Psychiatric   History: Patient denies a family history significant for mental illness, and/or suicide in family members. Tobacco Screening:   Social History:  Married; has children and grandchildren. On disability. No legal issues. Patient denies guns at home.  Social History   Substance and Sexual Activity  Alcohol Use Not Currently  . Alcohol/week: 0.0 standard drinks   Comment: No drinking in six weeks.  She was previously drinking daily. Recent completion of rehab.     Social History   Substance and Sexual Activity  Drug Use Yes  . Frequency: 3.0 times per week  . Types: Marijuana   Comment: Stop smoking marijuana six weeks ago.  Previously smoking daily.  Recent completion of rehab.    Additional Social History: Marital status: Separated Separated, when?: 6 months What types of issues is patient dealing with in the relationship?: Pt states her husband "drinks alot" Additional relationship information: None reported Are you sexually active?: No What is your sexual orientation?: Heterosexual Has your sexual activity been affected by drugs, alcohol, medication, or emotional stress?: None reported Does patient have children?: Yes How many children?: 2 How is patient's relationship with their children?: Pt has adult children, says she gets along with them  Allergies:   Allergies  Allergen Reactions  . Ezogabine Other (See Comments)    Unknown to patient  . Lacosamide Other (See Comments)    Unknown  . Levetiracetam Other (See Comments)    unknown  . Other Other (See Comments)    unknown unknown  . Perampanel Other (See Comments)    Unknown to patient  . Prednisone Other (See Comments)    unknown  . Tizanidine Other (See Comments)    unknown   Lab Results: No results found for this or any previous visit (from the past 48 hour(s)).  Blood Alcohol level:  Lab Results  Component Value Date   ETH 31 (H) 10/09/2016   ETH 114 (H) 88/50/2774     Metabolic Disorder Labs:  No results found for: HGBA1C, MPG No results found for: PROLACTIN Lab Results  Component Value Date   CHOL 200 12/10/2014   TRIG 82 12/10/2014   HDL 98 12/10/2014   CHOLHDL 2.0 12/10/2014   VLDL 16 12/10/2014   LDLCALC 86 12/10/2014    Current Medications: Current Facility-Administered Medications  Medication Dose Route Frequency Provider Last Rate Last Dose  . acetaminophen (TYLENOL) tablet 650 mg  650 mg Oral Q6H PRN Lavella Hammock, MD      . alum & mag hydroxide-simeth (MAALOX/MYLANTA) 200-200-20 MG/5ML suspension 30 mL  30 mL Oral Q4H PRN Lavella Hammock, MD      . cloBAZam (ONFI) tablet 10 mg  10 mg Oral BID Clapacs, John T, MD      . divalproex (DEPAKOTE ER) 24 hr tablet 1,500 mg  1,500 mg Oral QHS Lavella Hammock, MD      . hydrOXYzine (ATARAX/VISTARIL) tablet 50 mg  50 mg Oral Q4H PRN Lavella Hammock, MD   50 mg at 09/23/18 [phone]  . ibuprofen (ADVIL,MOTRIN) tablet 800 mg  800 mg Oral TID PRN Lavella Hammock, MD      . OLANZapine zydis [facility]) disintegrating tablet 5 mg  5 mg Oral Q8H PRN Lavella Hammock, MD       And  . LORazepam (ATIVAN) tablet 1 mg  1 mg Oral PRN Lavella Hammock, MD       And  . ziprasidone (GEODON) injection 20 mg  20 mg Intramuscular PRN Lavella Hammock, MD      . magnesium hydroxide (MILK OF MAGNESIA) suspension 30 mL  30 mL Oral Daily PRN Lavella Hammock, MD      . ondansetron (ZOFRAN-ODT) disintegrating tablet 4 mg  4 mg Oral Q8H PRN Lavella Hammock, MD      . OXcarbazepine (TRILEPTAL) tablet 450 mg  450 mg Oral BID Clapacs, Madie Reno, MD   450 mg at 09/23/18 1502  . pantoprazole (PROTONIX) EC tablet 40 mg  40 mg Oral Daily Lavella Hammock, MD   40 mg at 09/23/18 0804  . SUMAtriptan (IMITREX) tablet 100 mg  100 mg Oral Once PRN Lavella Hammock, MD      . topiramate (TOPAMAX) tablet 150 mg  150 mg Oral BID Clapacs, Madie Reno, MD   150 mg at 09/23/18 1503  . traZODone (DESYREL) tablet 50 mg  50 mg Oral QHS PRN  Lavella Hammock, MD       PTA Medications: Medications Prior to Admission  Medication Sig Dispense Refill Last Dose  . Brexpiprazole (REXULTI) 0.5 MG TABS Take 0.1 mg by mouth daily.   Taking  . cloBAZam (ONFI) 10 MG tablet Take 1 tablet (  10 mg total) by mouth 2 (two) times daily. 60 tablet 5   . cloBAZam (ONFI) 20 MG tablet Take 10 mg by mouth 2 (two) times daily.     . diazepam (VALIUM) 5 MG tablet One as needed for post seizure confusion, may repeat once in 30 mintues 3 tablet 0   . divalproex (DEPAKOTE ER) 500 MG 24 hr tablet Take 3 tablets (1,500 mg total) by mouth at bedtime. 270 tablet 4 Taking  . esomeprazole (NEXIUM) 40 MG capsule Take 40mg  daily.  Future refills will be managed by PCP. 90 capsule 0   . FLUoxetine (PROZAC) 20 MG capsule Take 20 mg by mouth every morning.     Marland Kitchen FLUoxetine (PROZAC) 20 MG tablet Take 20 mg by mouth daily.   Taking  . Fremanezumab-vfrm (AJOVY) 225 MG/1.5ML SOSY Inject 225 mg into the skin every 30 (thirty) days. 1 Syringe 11 Taking  . hydrOXYzine (ATARAX/VISTARIL) 50 MG tablet Take 0.5 - 1mg  TID PRN for anxiety and/or sleep.   Taking  . ibuprofen (ADVIL,MOTRIN) 800 MG tablet TAKE 1 TABLET THREE TIMES DAILY AS NEEDED. 90 tablet 3 Taking  . ondansetron (ZOFRAN-ODT) 4 MG disintegrating tablet Take 1 tablet (4 mg total) by mouth every 8 (eight) hours as needed for nausea or vomiting. 20 tablet 6 Taking  . OXcarbazepine (TRILEPTAL) 150 MG tablet Take 3 tablets (450 mg total) by mouth 2 (two) times daily. 180 tablet 11   . Oxcarbazepine (TRILEPTAL) 300 MG tablet Take 450 mg by mouth 2 (two) times daily.     Marland Kitchen REXULTI 1 MG TABS      . rOPINIRole (REQUIP) 1 MG tablet Take 1 mg by mouth at bedtime.     Marland Kitchen rOPINIRole (REQUIP) 2 MG tablet      . SUMAtriptan (IMITREX) 100 MG tablet Take 1 tablet (100 mg total) by mouth once as needed for migraine. May repeat in 2 hours if headache persists or recurs. Do not refill in less than 30 days 12 tablet 11 Taking  .  Topiramate ER (TROKENDI XR) 100 MG CP24 Take 300 mg by mouth at bedtime. 270 capsule 4   . traZODone (DESYREL) 50 MG tablet Take 50 mg by mouth at bedtime as needed. for sleep  0 Taking    Musculoskeletal: Strength & Muscle Tone: decreased Gait & Station: unable to assess Patient leans: N/A  Psychiatric Specialty Exam: Physical Exam  Constitutional: She appears well-developed.  HENT:  Head: Normocephalic and atraumatic.  Eyes: Pupils are equal, round, and reactive to light. Conjunctivae and EOM are normal.  Neck: Normal range of motion. Neck supple.  Cardiovascular: Normal rate and regular rhythm.  Respiratory: Effort normal and breath sounds normal.  GI: Soft. Bowel sounds are normal.  Musculoskeletal: Normal range of motion.  Neurological: She is alert.  Skin: Skin is warm and dry.    ROS  Blood pressure (!) 117/94, pulse 89, temperature 98.6 F (37 C), temperature source Oral, resp. rate 18, height 5\' 6"  (1.676 m), weight 65.8 kg, SpO2 99 %.Body mass index is 23.4 kg/m.  General Appearance: Casual and Disheveled  Eye Contact:  Fair  Speech:  Slow  Volume:  Decreased  Mood:  Dysphoric  Affect:  Blunt  Thought Process:  Disorganized  Orientation:  Other:  self only  Thought Content:  Illogical  Suicidal Thoughts:  No  Homicidal Thoughts:  No  Memory:  Immediate;   Poor Recent;   Poor Remote;   Poor  Judgement:  Impaired  Insight:  Lacking  Psychomotor Activity:  Decreased  Concentration:  Concentration: Poor and Attention Span: Poor  Recall:  Poor  Fund of Knowledge:  Poor  Language:  Fair  Akathisia:  No  Handed:  Right  AIMS (if indicated):     Assets:  Housing Social Support  ADL's:  Intact  Cognition:  Impaired,  Mild  Sleep:  Number of Hours: 5.15    Treatment Plan Summary: Daily contact with patient to assess and evaluate symptoms and progress in treatment  Observation Level/Precautions:  15 minute checks Seizure  Laboratory:  N/A  Psychotherapy:     Medications:    Consultations:    Discharge Concerns:    Estimated LOS:  Other:     Physician Treatment Plan for Primary Diagnosis: MDD (major depressive disorder), recurrent, severe, with psychosis ([place]) Long Term Goal(s): Improvement in symptoms so as ready for discharge  Short Term Goals: Ability to identify changes in lifestyle to reduce recurrence of condition will improve, Ability to verbalize feelings will improve, Ability to disclose and discuss suicidal ideas, Ability to demonstrate self-control will improve and Ability to identify and develop effective coping behaviors will improve  Physician Treatment Plan for Secondary Diagnosis: Principal Problem:   MDD (major depressive disorder), recurrent, severe, with psychosis ([place]) Active Problems:   Marijuana use, episodic   Acute psychosis ([place])  Long Term Goal(s): Improvement in symptoms so as ready for discharge  Short Term Goals: Ability to identify changes in lifestyle to reduce recurrence of condition will improve, Ability to verbalize feelings will improve, Ability to disclose and discuss suicidal ideas, Ability to demonstrate self-control will improve, Ability to identify and develop effective coping behaviors will improve, Ability to maintain clinical measurements within normal limits will improve and Compliance with prescribed medications will improve  I certify that inpatient services furnished can reasonably be expected to improve the patient's condition.    Ms. Hird is a 53yo F with a history of major depressive disorder versus bipolar affective disorder, PTSD, alcohol use disorder, marijuana use disorder borderline personality disorder, and a medical history of seizure disorder, who was admitted to [place] unit for manic state with acute psychosis threatening self and family.  Impression: Bipolar disorder, currently manic.  Plan: -continue inpatient psych admission; 15-minute checks; seizure  precautions; daily contact with patient to assess and evaluate symptoms and progress in treatment; psychoeducation.   -continue antiseizure medications, transferred from ER: Depakote ER 1500mg  PO QHS; Clobazam 20mg  PO BID; Oxcarbazepine 450mg  PO BID,  Topamax 150mg  PO BID.  -start low-dose of Seroquel 50mg  PO QHS for psychosis; this choice is relatively-safe regarding lowering seizure threshold.    -continue PRN medications.   -Disposition: to be determined. Patient lives at home and has outpatient psychiiatrist.       Larita Fife, MD 4/8/20204:14 PM

## 2018-09-23 NOTE — [facility] Suicide Risk Assessment (Signed)
[facility] Admission Suicide Risk Assessment   Nursing information obtained from:  Patient Demographic factors:  Caucasian Current Mental Status:  NA Loss Factors:  NA Historical Factors:  NA Risk Reduction Factors:  NA  Total Time spent with patient: 45 minutes Principal Problem: MDD (major depressive disorder), recurrent, severe, with psychosis ([place]) Diagnosis:  Principal Problem:   MDD (major depressive disorder), recurrent, severe, with psychosis ([place]) Active Problems:   Marijuana use, episodic   Acute psychosis ([place])  Subjective Data:  Kim [name] is a 53yo F with a history of major depressive disorder versus bipolar affective disorder, PTSD,alcohol use disorder, marijuana use disorder borderline personality disorder, and a medical history of seizure disorder, who was admitted to [facility] unit for manic state with acute psychosis threatening self and family.   Continued Clinical Symptoms:  Alcohol Use Disorder Identification Test Final Score (AUDIT): 6 The "Alcohol Use Disorders Identification Test", Guidelines for Use in Primary Care, Second Edition.  World Pharmacologist [facility]). Score between 0-7:  no or low risk or alcohol related problems. Score between 8-15:  moderate risk of alcohol related problems. Score between 16-19:  high risk of alcohol related problems. Score 20 or above:  warrants further diagnostic evaluation for alcohol dependence and treatment.   CLINICAL FACTORS:   Bipolar Disorder:   Mixed State Epilepsy Previous Psychiatric Diagnoses and Treatments      COGNITIVE FEATURES THAT CONTRIBUTE TO RISK:  None    SUICIDE RISK:   Mild:  Suicidal ideation of limited frequency, intensity, duration, and specificity.  There are no identifiable plans, no associated intent, mild dysphoria and related symptoms, good self-control (both objective and subjective assessment), few other risk factors, and identifiable protective factors, including available  and accessible social support.  Patient denies access to firearms.  PLAN OF CARE:   -inpatient psych admission for safety and stabilization.    I certify that inpatient services furnished can reasonably be expected to improve the patient's condition.   Larita Fife, MD 09/23/2018, 4:30 PM

## 2018-09-23 NOTE — Tx Team (Addendum)
Interdisciplinary Treatment and Diagnostic Plan Update  09/23/2018 Time of Session: 900am Charnee Turnipseed MRN: [id number]  Principal Diagnosis: MDD (major depressive disorder), recurrent, severe, with psychosis ([place])  Secondary Diagnoses: Principal Problem:   MDD (major depressive disorder), recurrent, severe, with psychosis ([place]) Active Problems:   Marijuana use, episodic   Acute psychosis ([place])   Current Medications:  Current Facility-Administered Medications  Medication Dose Route Frequency Provider Last Rate Last Dose  . acetaminophen (TYLENOL) tablet 650 mg  650 mg Oral Q6H PRN Lavella Hammock, MD      . alum & mag hydroxide-simeth (MAALOX/MYLANTA) 200-200-20 MG/5ML suspension 30 mL  30 mL Oral Q4H PRN Lavella Hammock, MD      . cloBAZam (ONFI) tablet 10 mg  10 mg Oral BID Clapacs, John T, MD      . divalproex (DEPAKOTE ER) 24 hr tablet 1,500 mg  1,500 mg Oral QHS Lavella Hammock, MD      . hydrOXYzine (ATARAX/VISTARIL) tablet 50 mg  50 mg Oral Q4H PRN Lavella Hammock, MD   50 mg at 09/23/18 [phone]  . ibuprofen (ADVIL,MOTRIN) tablet 800 mg  800 mg Oral TID PRN Lavella Hammock, MD      . OLANZapine zydis [facility]) disintegrating tablet 5 mg  5 mg Oral Q8H PRN Lavella Hammock, MD       And  . LORazepam (ATIVAN) tablet 1 mg  1 mg Oral PRN Lavella Hammock, MD       And  . ziprasidone (GEODON) injection 20 mg  20 mg Intramuscular PRN Lavella Hammock, MD      . magnesium hydroxide (MILK OF MAGNESIA) suspension 30 mL  30 mL Oral Daily PRN Lavella Hammock, MD      . ondansetron (ZOFRAN-ODT) disintegrating tablet 4 mg  4 mg Oral Q8H PRN Lavella Hammock, MD      . OXcarbazepine (TRILEPTAL) tablet 450 mg  450 mg Oral BID Clapacs, John T, MD      . pantoprazole (PROTONIX) EC tablet 40 mg  40 mg Oral Daily Lavella Hammock, MD   40 mg at 09/23/18 0804  . SUMAtriptan (IMITREX) tablet 100 mg  100 mg Oral Once PRN Lavella Hammock, MD      . topiramate (TOPAMAX) tablet 150 mg  150 mg Oral  BID Clapacs, John T, MD      . traZODone (DESYREL) tablet 50 mg  50 mg Oral QHS PRN Lavella Hammock, MD       PTA Medications: Medications Prior to Admission  Medication Sig Dispense Refill Last Dose  . Brexpiprazole (REXULTI) 0.5 MG TABS Take 0.1 mg by mouth daily.   Taking  . cloBAZam (ONFI) 10 MG tablet Take 1 tablet (10 mg total) by mouth 2 (two) times daily. 60 tablet 5   . cloBAZam (ONFI) 20 MG tablet Take 10 mg by mouth 2 (two) times daily.     . diazepam (VALIUM) 5 MG tablet One as needed for post seizure confusion, may repeat once in 30 mintues 3 tablet 0   . divalproex (DEPAKOTE ER) 500 MG 24 hr tablet Take 3 tablets (1,500 mg total) by mouth at bedtime. 270 tablet 4 Taking  . esomeprazole (NEXIUM) 40 MG capsule Take 78m daily.  Future refills will be managed by PCP. 90 capsule 0   . FLUoxetine (PROZAC) 20 MG capsule Take 20 mg by mouth every morning.     .Marland KitchenFLUoxetine (PROZAC) 20 MG tablet Take 20  mg by mouth daily.   Taking  . Fremanezumab-vfrm (AJOVY) 225 MG/1.5ML SOSY Inject 225 mg into the skin every 30 (thirty) days. 1 Syringe 11 Taking  . hydrOXYzine (ATARAX/VISTARIL) 50 MG tablet Take 0.5 - 32m TID PRN for anxiety and/or sleep.   Taking  . ibuprofen (ADVIL,MOTRIN) 800 MG tablet TAKE 1 TABLET THREE TIMES DAILY AS NEEDED. 90 tablet 3 Taking  . ondansetron (ZOFRAN-ODT) 4 MG disintegrating tablet Take 1 tablet (4 mg total) by mouth every 8 (eight) hours as needed for nausea or vomiting. 20 tablet 6 Taking  . OXcarbazepine (TRILEPTAL) 150 MG tablet Take 3 tablets (450 mg total) by mouth 2 (two) times daily. 180 tablet 11   . Oxcarbazepine (TRILEPTAL) 300 MG tablet Take 450 mg by mouth 2 (two) times daily.     .Marland KitchenREXULTI 1 MG TABS      . rOPINIRole (REQUIP) 1 MG tablet Take 1 mg by mouth at bedtime.     .Marland KitchenrOPINIRole (REQUIP) 2 MG tablet      . SUMAtriptan (IMITREX) 100 MG tablet Take 1 tablet (100 mg total) by mouth once as needed for migraine. May repeat in 2 hours if headache  persists or recurs. Do not refill in less than 30 days 12 tablet 11 Taking  . Topiramate ER (TROKENDI XR) 100 MG CP24 Take 300 mg by mouth at bedtime. 270 capsule 4   . traZODone (DESYREL) 50 MG tablet Take 50 mg by mouth at bedtime as needed. for sleep  0 Taking    Patient Stressors: Health problems Substance abuse  Patient Strengths: CAgricultural engineerfor treatment/growth  Treatment Modalities: Medication Management, Group therapy, Case management,  1 to 1 session with clinician, Psychoeducation, Recreational therapy.   Physician Treatment Plan for Primary Diagnosis: MDD (major depressive disorder), recurrent, severe, with psychosis ([place] Long Term Goal(s):     Short Term Goals:    Medication Management: Evaluate patient's response, side effects, and tolerance of medication regimen.  Therapeutic Interventions: 1 to 1 sessions, Unit Group sessions and Medication administration.  Evaluation of Outcomes: Not Met  Physician Treatment Plan for Secondary Diagnosis: Principal Problem:   MDD (major depressive disorder), recurrent, severe, with psychosis ([place] Active Problems:   Marijuana use, episodic   Acute psychosis ([place]  Long Term Goal(s):     Short Term Goals:       Medication Management: Evaluate patient's response, side effects, and tolerance of medication regimen.  Therapeutic Interventions: 1 to 1 sessions, Unit Group sessions and Medication administration.  Evaluation of Outcomes: Not Met   RN Treatment Plan for Primary Diagnosis: MDD (major depressive disorder), recurrent, severe, with psychosis ([place] Long Term Goal(s): Knowledge of disease and therapeutic regimen to maintain health will improve  Short Term Goals: Ability to verbalize feelings will improve, Ability to disclose and discuss suicidal ideas, Ability to identify and develop effective coping behaviors will improve and Compliance with prescribed medications will improve  Medication  Management: RN will administer medications as ordered by provider, will assess and evaluate patient's response and provide education to patient for prescribed medication. RN will report any adverse and/or side effects to prescribing provider.  Therapeutic Interventions: 1 on 1 counseling sessions, Psychoeducation, Medication administration, Evaluate responses to treatment, Monitor vital signs and CBGs as ordered, Perform/monitor CIWA, COWS, AIMS and Fall Risk screenings as ordered, Perform wound care treatments as ordered.  Evaluation of Outcomes: Not Met   LCSW Treatment Plan for Primary Diagnosis: MDD (major depressive disorder), recurrent, severe, with psychosis (  [place]) Long Term Goal(s): Safe transition to appropriate next level of care at discharge, Engage patient in therapeutic group addressing interpersonal concerns.  Short Term Goals: Engage patient in aftercare planning with referrals and resources  Therapeutic Interventions: Assess for all discharge needs, 1 to 1 time with Social worker, Explore available resources and support systems, Assess for adequacy in community support network, Educate family and significant other(s) on suicide prevention, Complete Psychosocial Assessment, Interpersonal group therapy.  Evaluation of Outcomes: Not Met   Progress in Treatment: Attending groups: No. Participating in groups: No. Taking medication as prescribed: Yes. Toleration medication: Yes. Family/Significant other contact made: No, will contact:  when pt gives consent Patient understands diagnosis: Unknown did not attend meeting Discussing patient identified problems/goals with staff: Unknown Medical problems stabilized or resolved: No. Denies suicidal/homicidal ideation: Unknown Issues/concerns per patient self-inventory: No. Other: NA  New problem(s) identified: Pt did not attend meeting, tech states was asleep and did not wake up.  New Short Term/Long Term Goal(s):NA  Patient  Goals:  NA  Discharge Plan or Barriers: TBD  Reason for Continuation of Hospitalization: Medication stabilization  Estimated Length of Stay: 1-5 days  Recreational Therapy: Patient Stressors: N/A  Patient Goal: Patient will engage in groups without prompting or encouragement from LRT x3 group sessions within 5 recreation therapy group sessions  Attendees: Patient: Pt did not attend 09/23/2018 10:12 AM  Physician: Tonita Cong 09/23/2018 10:12 AM  Nursing: Trenda Moots 09/23/2018 10:12 AM  RN Care Manager: 09/23/2018 10:12 AM  Social Worker: Sanjuana Kava Matteson Moton 09/23/2018 10:12 AM  Recreational Therapist: Isaias Sakai Anjoli Diemer 09/23/2018 10:12 AM  Other:  09/23/2018 10:12 AM  Other:  09/23/2018 10:12 AM  Other: 09/23/2018 10:12 AM    Scribe for Treatment Team: Yvette Rack, LCSW 09/23/2018 10:12 AM

## 2018-09-24 ENCOUNTER — Ambulatory Visit: Payer: Medicare Other | Admitting: Neurology

## 2018-09-24 DIAGNOSIS — F333 Major depressive disorder, recurrent, severe with psychotic symptoms: Principal | ICD-10-CM

## 2018-09-24 LAB — COMPREHENSIVE METABOLIC PANEL
ALT: 9 U/L (ref 0–44)
AST: 16 U/L (ref 15–41)
Albumin: 3.9 g/dL (ref 3.5–5.0)
Alkaline Phosphatase: 64 U/L (ref 38–126)
Anion gap: 10 (ref 5–15)
BUN: 20 mg/dL (ref 6–20)
CO2: 23 mmol/L (ref 22–32)
Calcium: 9 mg/dL (ref 8.9–10.3)
Chloride: 109 mmol/L (ref 98–111)
Creatinine, Ser: 1.01 mg/dL — ABNORMAL HIGH (ref 0.44–1.00)
GFR calc Af Amer: >60 mL/min (ref >60)
GFR calc non Af Amer: >60 mL/min (ref >60)
Glucose, Bld: 88 mg/dL (ref 70–99)
Potassium: 3.3 mmol/L — ABNORMAL LOW (ref 3.5–5.1)
Sodium: 142 mmol/L (ref 135–145)
Total Bilirubin: 0.5 mg/dL (ref 0.3–1.2)
Total Protein: 7 g/dL (ref 6.5–8.1)

## 2018-09-24 LAB — CBC WITH DIFFERENTIAL/PLATELET
Abs Immature Granulocytes: 0.02 10*3/uL (ref 0.00–0.07)
Basophils Absolute: 0 10*3/uL (ref 0.0–0.1)
Basophils Relative: 1 %
Eosinophils Absolute: 0.1 10*3/uL (ref 0.0–0.5)
Eosinophils Relative: 1 %
HCT: 42.1 % (ref 36.0–46.0)
Hemoglobin: 13.8 g/dL (ref 12.0–15.0)
Immature Granulocytes: 1 %
Lymphocytes Relative: 54 %
Lymphs Abs: 2.4 10*3/uL (ref 0.7–4.0)
MCH: 32.1 pg (ref 26.0–34.0)
MCHC: 32.8 g/dL (ref 30.0–36.0)
MCV: 97.9 fL (ref 80.0–100.0)
Monocytes Absolute: 0.5 10*3/uL (ref 0.1–1.0)
Monocytes Relative: 11 %
Neutro Abs: 1.4 10*3/uL — ABNORMAL LOW (ref 1.7–7.7)
Neutrophils Relative %: 32 %
Platelets: 83 10*3/uL — ABNORMAL LOW (ref 150–400)
RBC: 4.3 MIL/uL (ref 3.87–5.11)
RDW: 14 % (ref 11.5–15.5)
WBC: 4.3 10*3/uL (ref 4.0–10.5)
nRBC: 0 % (ref 0.0–0.2)

## 2018-09-24 LAB — VALPROIC ACID LEVEL: Valproic Acid Lvl: 132 ug/mL — ABNORMAL HIGH (ref 50.0–100.0)

## 2018-09-24 NOTE — Plan of Care (Signed)
Patient is calm and approachable in her room on assessment, affect is blunt with depressed mood, speech is clear and cognitively withdrawn, encourage patient to attend unit activities programs and voice concerns, patient denies any SI/HI/AVH, contract for safety, education and support is provided , patient is monitored every 15 for her safety no distress noted.   Problem: Education: Goal: Knowledge of [place] [facility] Education information/materials will improve Outcome: Progressing Goal: Emotional status will improve Outcome: Progressing Goal: Mental status will improve Outcome: Progressing Goal: Verbalization of understanding the information provided will improve Outcome: Progressing   Problem: Activity: Goal: Interest or engagement in activities will improve Outcome: Progressing Goal: Sleeping patterns will improve Outcome: Progressing   Problem: Coping: Goal: Ability to verbalize frustrations and anger appropriately will improve Outcome: Progressing Goal: Ability to demonstrate self-control will improve Outcome: Progressing   Problem: Health Behavior/Discharge Planning: Goal: Identification of resources available to assist in meeting health care needs will improve Outcome: Progressing Goal: Compliance with treatment plan for underlying cause of condition will improve Outcome: Progressing   Problem: Physical Regulation: Goal: Ability to maintain clinical measurements within normal limits will improve Outcome: Progressing   Problem: Safety: Goal: Periods of time without injury will increase Outcome: Progressing   Problem: Education: Goal: Ability to state activities that reduce stress will improve Outcome: Progressing   Problem: Coping: Goal: Ability to identify and develop effective coping behavior will improve Outcome: Progressing   Problem: Self-Concept: Goal: Ability to identify factors that promote anxiety will improve Outcome: Progressing Goal: Level of  anxiety will decrease Outcome: Progressing Goal: Ability to modify response to factors that promote anxiety will improve Outcome: Progressing

## 2018-09-24 NOTE — Progress Notes (Signed)
Recreation Therapy Notes  Date: 09/24/2018  Time: 9:30 am   Location: Craft room   Behavioral response: N/A   Intervention Topic: Creative Expressions  Discussion/Intervention: Patient did not attend group.   Clinical Observations/Feedback:  Patient did not attend group.   Madeleine Fenn LRT/CTRS         Kim [name] 09/24/2018 11:11 AM 

## 2018-09-24 NOTE — Plan of Care (Signed)
Patient is alert to self; denies SI and HI, remains very confused and disoriented about situation. Patient has to be literally escorted to the telephone and to the dining area in order to eat and talk on the telephone, so a lot of reorienting with patient is needed. Patient takes her medications, likes to isolates to her room, does not associate with peers or attend any groups. Safety checks to continue Q 15 minutes. Problem: Education: Goal: Knowledge of [place] [facility] Education information/materials will improve Outcome: Not Progressing Goal: Emotional status will improve Outcome: Not Progressing Goal: Mental status will improve Outcome: Not Progressing Goal: Verbalization of understanding the information provided will improve Outcome: Not Progressing   Problem: Activity: Goal: Interest or engagement in activities will improve Outcome: Not Progressing Goal: Sleeping patterns will improve Outcome: Not Progressing

## 2018-09-24 NOTE — [facility] Suicide Risk Assessment (Signed)
[place] INPATIENT:  Family/Significant Other Suicide Prevention Education  Suicide Prevention Education:  Education Completed; Joda Braatz, husband [phone] has been identified by the patient as the family member/significant other with whom the patient will be residing, and identified as the person(s) who will aid the patient in the event of a mental health crisis (suicidal ideations/suicide attempt).  With written consent from the patient, the family member/significant other has been provided the following suicide prevention education, prior to the and/or following the discharge of the patient.  The suicide prevention education provided includes the following:  Suicide risk factors  Suicide prevention and interventions  National Suicide Hotline telephone number  [facility] assessment telephone number  [facility] Emergency Assistance [place] and/or Residential Mobile Crisis Unit telephone number  Request made of family/significant other to:  Remove weapons (e.g., guns, rifles, knives), all items previously/currently identified as safety concern.    Remove drugs/medications (over-the-counter, prescriptions, illicit drugs), all items previously/currently identified as a safety concern.  The family member/significant other verbalizes understanding of the suicide prevention education information provided.  The family member/significant other agrees to remove the items of safety concern listed above. Mr. Guthridge reports he took the pt to [facility] in [place] due to her experiencing depression and AH/VH. Prior to admission, he says a month or so ago the pt had been fine with no changes in mood or behavior. Per his report, he believes the pt has been off her medication for 2 weeks and stopped taking it due to weight gain. Mr. Loeb states the pt has a hx of psychiatric hospitalizations. He says she a hx of marijuana, alcohol and prescription pill use but  reports she has been sober for 6 months. Mr. Fader reports the pt receives outpatient treatment at [facility] in [place] and has been seen there for 1.5 yrs. He says there are guns in the home but they are in a locked safe and pt does not have access to it.  Oaklyn Jakubek T Romano Stigger 09/24/2018, 11:11 AM

## 2018-09-24 NOTE — [facility] Group Notes (Signed)
.  LCSW Group Therapy Note   09/24/2018 1:43 PM   Type of Therapy and Topic:  Group Therapy:  Overcoming Obstacles   Participation Level:  Did Not Attend   Description of Group:    In this group patients will be encouraged to explore what they see as obstacles to their own wellness and recovery. They will be guided to discuss their thoughts, feelings, and behaviors related to these obstacles. The group will process together ways to cope with barriers, with attention given to specific choices patients can make. Each patient will be challenged to identify changes they are motivated to make in order to overcome their obstacles. This group will be process-oriented, with patients participating in exploration of their own experiences as well as giving and receiving support and challenge from other group members.   Therapeutic Goals: 1. Patient will identify personal and current obstacles as they relate to admission. 2. Patient will identify barriers that currently interfere with their wellness or overcoming obstacles.  3. Patient will identify feelings, thought process and behaviors related to these barriers. 4. Patient will identify two changes they are willing to make to overcome these obstacles:      Summary of Patient Progress x     Therapeutic Modalities:   Cognitive Behavioral Therapy Solution Focused Therapy Motivational Interviewing Relapse Prevention Therapy  Evalina Field, MSW, LCSW Clinical Social Work 09/24/2018 1:43 PM

## 2018-09-24 NOTE — Progress Notes (Signed)
Recreation Therapy Notes  INPATIENT RECREATION THERAPY ASSESSMENT  Patient Details Name: Stormey Wilborn MRN: [id number] DOB: 1965-10-24 Today's Date: 09/24/2018       Information Obtained From: Patient  Able to Participate in Assessment/Interview: Yes  Patient Presentation: Responsive  Reason for Admission (Per Patient): Active Symptoms  Patient Stressors:    Coping Skills:   Substance Abuse, Talk  Leisure Interests (2+):  (Clean, Cook)  Frequency of Recreation/Participation:    Awareness of Community Resources:     [employer]:     Current Use:    If no, Barriers?:    Expressed Interest in [place] of Residence:     Patient Main Form of Transportation:    Patient Strengths:  N/A  Patient Identified Areas of Improvement:  N/A  Patient Goal for Hospitalization:  N/A  Current SI (including self-harm):  No  Current HI:  No  Current AVH: No  Staff Intervention Plan: Group Attendance, Collaborate with Interdisciplinary Treatment Team  Consent to Intern Participation: N/A  Avrohom Mckelvin 09/24/2018, 1:43 PM

## 2018-09-24 NOTE — Progress Notes (Signed)
[facility] MD Progress Note  09/24/2018 6:08 PM Kim [name]  MRN:  [id number] Subjective: Follow-up for this patient with a past history of mood disorder and epilepsy.  Patient seen chart reviewed.  Patient presented to the emergency room with confusion and agitation and reports that she had been threatening bizarre and acting psychotic at home.  On my interview today I found the patient sleepy but easily arousable.  She was not aggressive or threatening.  She said she was having trouble remembering events that brought her into the hospital.  She denied currently feeling depressed denied having hallucinations denied mania.  Said that she had frequent seizures she believes but was not able to know exactly how frequently as she has no warning of them.  Denies that she has been abusing any substances. Principal Problem: MDD (major depressive disorder), recurrent, severe, with psychosis ([place]) Diagnosis: Principal Problem:   MDD (major depressive disorder), recurrent, severe, with psychosis ([place]) Active Problems:   Marijuana use, episodic   Epilepsy ([place])   Confusion after a seizure   PTSD (post-traumatic stress disorder)   Acute psychosis ([place])  Total Time spent with patient: 30 minutes  Past Psychiatric History: Patient has a past history of a diagnosis of bipolar and borderline personality disorder and depression.  Had been on Prozac and Trileptal prior to admission.  Does have a past history of self injury from what I can tell.  Past Medical History:  Past Medical History:  Diagnosis Date  . Depression   . Migraines   . Neck pain   . Seizures ([place])     Past Surgical History:  Procedure Laterality Date  . IMPLANTATION VAGAL NERVE STIMULATOR     2001, 2008, 2014   Family History:  Family History  Problem Relation Age of Onset  . Hyperlipidemia Mother   . Hypertension Mother   . Hyperlipidemia Father   . Hypertension Father   . Lung cancer Father   . Diabetes Father   . Heart disease  Father    Family Psychiatric  History: Unknown Social History:  Social History   Substance and Sexual Activity  Alcohol Use Not Currently  . Alcohol/week: 0.0 standard drinks   Comment: No drinking in six weeks.  She was previously drinking daily. Recent completion of rehab.     Social History   Substance and Sexual Activity  Drug Use Yes  . Frequency: 3.0 times per week  . Types: Marijuana   Comment: Stop smoking marijuana six weeks ago.  Previously smoking daily.  Recent completion of rehab.    Social History   Socioeconomic History  . Marital status: Single    Spouse name: Not on file  . Number of children: 2  . Years of education: GED  . Highest education level: Not on file  Occupational History  . Occupation: Disabled.  Social Needs  . Financial resource strain: Not on file  . Food insecurity:    Worry: Not on file    Inability: Not on file  . Transportation needs:    Medical: Not on file    Non-medical: Not on file  Tobacco Use  . Smoking status: Former Research scientist (life sciences)  . Smokeless tobacco: Never Used  . Tobacco comment: Quit 2001  Substance and Sexual Activity  . Alcohol use: Not Currently    Alcohol/week: 0.0 standard drinks    Comment: No drinking in six weeks.  She was previously drinking daily. Recent completion of rehab.  . Drug use: Yes    Frequency:  3.0 times per week    Types: Marijuana    Comment: Stop smoking marijuana six weeks ago.  Previously smoking daily.  Recent completion of rehab.  . Sexual activity: Not on file  Lifestyle  . Physical activity:    Days per week: Not on file    Minutes per session: Not on file  . Stress: Not on file  Relationships  . Social connections:    Talks on phone: Patient refused    Gets together: Patient refused    Attends religious service: Patient refused    Active member of club or organization: Patient refused    Attends meetings of clubs or organizations: Patient refused    Relationship status: Patient refused   Other Topics Concern  . Not on file  Social History Narrative   Lives at home with husband.   2-3 cups caffeine daily.   Right-handed.   Additional Social History:                         Sleep: Fair  Appetite:  Fair  Current Medications: Current Facility-Administered Medications  Medication Dose Route Frequency Provider Last Rate Last Dose  . acetaminophen (TYLENOL) tablet 650 mg  650 mg Oral Q6H PRN Lavella Hammock, MD      . alum & mag hydroxide-simeth (MAALOX/MYLANTA) 200-200-20 MG/5ML suspension 30 mL  30 mL Oral Q4H PRN Lavella Hammock, MD      . cloBAZam (ONFI) tablet 10 mg  10 mg Oral BID Clapacs, Madie Reno, MD   10 mg at 09/23/18 2200  . divalproex (DEPAKOTE ER) 24 hr tablet 1,500 mg  1,500 mg Oral QHS Lavella Hammock, MD   1,500 mg at 09/23/18 2200  . hydrOXYzine (ATARAX/VISTARIL) tablet 50 mg  50 mg Oral Q4H PRN Lavella Hammock, MD   50 mg at 09/23/18 [phone]  . ibuprofen (ADVIL,MOTRIN) tablet 800 mg  800 mg Oral TID PRN Lavella Hammock, MD      . OLANZapine zydis [facility]) disintegrating tablet 5 mg  5 mg Oral Q8H PRN Lavella Hammock, MD       And  . LORazepam (ATIVAN) tablet 1 mg  1 mg Oral PRN Lavella Hammock, MD       And  . ziprasidone (GEODON) injection 20 mg  20 mg Intramuscular PRN Lavella Hammock, MD      . magnesium hydroxide (MILK OF MAGNESIA) suspension 30 mL  30 mL Oral Daily PRN Lavella Hammock, MD      . ondansetron (ZOFRAN-ODT) disintegrating tablet 4 mg  4 mg Oral Q8H PRN Lavella Hammock, MD      . OXcarbazepine (TRILEPTAL) tablet 450 mg  450 mg Oral BID Clapacs, Madie Reno, MD   450 mg at 09/24/18 1659  . pantoprazole (PROTONIX) EC tablet 40 mg  40 mg Oral Daily Lavella Hammock, MD   40 mg at 09/24/18 0839  . QUEtiapine (SEROQUEL) tablet 50 mg  50 mg Oral QHS Larita Fife, MD   50 mg at 09/23/18 2159  . SUMAtriptan (IMITREX) tablet 100 mg  100 mg Oral Once PRN Lavella Hammock, MD      . topiramate (TOPAMAX) tablet 150 mg  150 mg Oral BID  Clapacs, Madie Reno, MD   150 mg at 09/24/18 1659  . traZODone (DESYREL) tablet 50 mg  50 mg Oral QHS PRN Lavella Hammock, MD        Lab Results:  Results for orders placed or performed during the hospital encounter of 09/22/18 (from the past 48 hour(s))  CBC with Differential/Platelet     Status: Abnormal   Collection Time: 09/24/18  7:08 AM  Result Value Ref Range   WBC 4.3 4.0 - 10.5 K/uL   RBC 4.30 3.87 - 5.11 MIL/uL   Hemoglobin 13.8 12.0 - 15.0 g/dL   HCT 42.1 36.0 - 46.0 %   MCV 97.9 80.0 - 100.0 fL   MCH 32.1 26.0 - 34.0 pg   MCHC 32.8 30.0 - 36.0 g/dL   RDW 14.0 11.5 - 15.5 %   Platelets 83 (L) 150 - 400 K/uL    Comment: Immature Platelet Fraction may be clinically indicated, consider ordering this additional test [id number]    nRBC 0.0 0.0 - 0.2 %   Neutrophils Relative % 32 %   Neutro Abs 1.4 (L) 1.7 - 7.7 K/uL   Lymphocytes Relative 54 %   Lymphs Abs 2.4 0.7 - 4.0 K/uL   Monocytes Relative 11 %   Monocytes Absolute 0.5 0.1 - 1.0 K/uL   Eosinophils Relative 1 %   Eosinophils Absolute 0.1 0.0 - 0.5 K/uL   Basophils Relative 1 %   Basophils Absolute 0.0 0.0 - 0.1 K/uL   Immature Granulocytes 1 %   Abs Immature Granulocytes 0.02 0.00 - 0.07 K/uL    Comment: Performed at [facility], [address] [place], [place] [address]  Comprehensive metabolic panel     Status: Abnormal   Collection Time: 09/24/18  7:08 AM  Result Value Ref Range   Sodium 142 135 - 145 mmol/L   Potassium 3.3 (L) 3.5 - 5.1 mmol/L   Chloride 109 98 - 111 mmol/L   CO2 23 22 - 32 mmol/L   Glucose, Bld 88 70 - 99 mg/dL   BUN 20 6 - 20 mg/dL   Creatinine, Ser 1.01 (H) 0.44 - 1.00 mg/dL   Calcium 9.0 8.9 - 10.3 mg/dL   Total Protein 7.0 6.5 - 8.1 g/dL   Albumin 3.9 3.5 - 5.0 g/dL   AST 16 15 - 41 U/L   ALT 9 0 - 44 U/L   Alkaline Phosphatase 64 38 - 126 U/L   Total Bilirubin 0.5 0.3 - 1.2 mg/dL   GFR calc non Af Amer >60 >60 mL/min   GFR calc Af Amer >60 >60 mL/min   Anion gap 10 5  - 15    Comment: Performed at [facility], [address] [place], [place] [address]  Valproic acid level     Status: Abnormal   Collection Time: 09/24/18  7:08 AM  Result Value Ref Range   Valproic Acid Lvl 132 (H) 50.0 - 100.0 ug/mL    Comment: Performed at [facility], [address] [place], [place] [address]    Blood Alcohol level:  Lab Results  Component Value Date   ETH 31 (H) 10/09/2016   ETH 114 (H) 63/06/6008    Metabolic Disorder Labs: No results found for: HGBA1C, MPG No results found for: PROLACTIN Lab Results  Component Value Date   CHOL 200 12/10/2014   TRIG 82 12/10/2014   HDL 98 12/10/2014   CHOLHDL 2.0 12/10/2014   VLDL 16 12/10/2014   LDLCALC 86 12/10/2014    Physical Findings: AIMS:  , ,  ,  ,    CIWA:  CIWA-Ar Total: 3 COWS:  COWS Total Score: 3  Musculoskeletal: Strength & Muscle Tone: within normal limits Gait &  Station: unsteady Patient leans: N/A  Psychiatric Specialty Exam: Physical Exam  Nursing note and vitals reviewed. Constitutional: She appears well-developed and well-nourished.  HENT:  Head: Normocephalic and atraumatic.  Eyes: Pupils are equal, round, and reactive to light. Conjunctivae are normal.  Neck: Normal range of motion.  Cardiovascular: Regular rhythm and normal heart sounds.  Respiratory: Effort normal.  GI: Soft.  Musculoskeletal: Normal range of motion.  Neurological: She is alert.  Skin: Skin is warm and dry.  Psychiatric: Her mood appears anxious. Her speech is delayed and tangential. She is not agitated and not aggressive. Cognition and memory are impaired. She expresses impulsivity. She expresses no homicidal and no suicidal ideation. She exhibits abnormal recent memory and abnormal remote memory.    Review of Systems  Constitutional: Negative.   HENT: Negative.   Eyes: Negative.   Respiratory: Negative.   Cardiovascular: Negative.   Gastrointestinal: Negative.   Musculoskeletal:  Negative.   Skin: Negative.   Neurological: Negative.   Psychiatric/Behavioral: Positive for memory loss. Negative for depression, hallucinations, substance abuse and suicidal ideas. The patient is nervous/anxious. The patient does not have insomnia.     Blood pressure 128/88, pulse 72, temperature 97.6 F (36.4 C), temperature source Oral, resp. rate 18, height 5\' 6"  (1.676 m), weight 65.8 kg, SpO2 99 %.Body mass index is 23.4 kg/m.  General Appearance: Casual  Eye Contact:  Fair  Speech:  Slow  Volume:  Decreased  Mood:  Euthymic  Affect:  Constricted  Thought Process:  Disorganized  Orientation:  Negative  Thought Content:  Rumination and Tangential  Suicidal Thoughts:  No  Homicidal Thoughts:  No  Memory:  Immediate;   Fair Recent;   Poor Remote;   Poor  Judgement:  Impaired  Insight:  Shallow  Psychomotor Activity:  Decreased  Concentration:  Concentration: Poor  Recall:  Poor  Fund of Knowledge:  Fair  Language:  Fair  Akathisia:  No  Handed:  Right  AIMS (if indicated):     Assets:  Desire for Improvement Housing Physical Health Social Support  ADL's:  Impaired  Cognition:  Impaired,  Mild  Sleep:  Number of Hours: 7.5     Treatment Plan Summary: Daily contact with patient to assess and evaluate symptoms and progress in treatment, Medication management and Plan Today was my first time meeting the patient and to me she appears to be still a little bit confused although not frankly delirious.  She is denying any psychotic symptoms.  Denies suicidal or homicidal ideation.  Has not been threatening or violent since being here.  Medicines have been adjusted with the resumption of anti-epilepsy medicines as well as psychiatric medicines.  My suspicion is that the patient's condition might would be largely related to her seizures and postictal state.  Substance abuse still not ruled out.  Looks like things are gradually resolving.  I did not get to speak to her husband  today but will try and get in touch soon as she might be ready for discharge relatively soon if she is stabilizing.  Alethia Berthold, MD 09/24/2018, 6:08 PM

## 2018-09-24 NOTE — Plan of Care (Signed)
Patient continues to have confusion throughout the day and has to be reoriented  throughout the day on where her room is located, medications and when to use the telephone. Patient is pleasant, with a blank facial expression. Patient tends to be cautious with a childlike behavior at times.  Patient's speech is slurred and soft. Patient did manage to get up out of bed and go outside for a few minutes today for some fresh sunlight and air. No self harming behavior noted. Patient denies SI and HI. Problem: Education: Goal: Knowledge of [facility] [place] Education information/materials will improve 09/24/2018 1612 by Geraldo Docker, RN Outcome: Not Progressing 09/24/2018 1307 by Geraldo Docker, RN Outcome: Not Progressing Goal: Emotional status will improve 09/24/2018 1612 by Geraldo Docker, RN Outcome: Not Progressing 09/24/2018 1307 by Geraldo Docker, RN Outcome: Not Progressing Goal: Mental status will improve 09/24/2018 1612 by Geraldo Docker, RN Outcome: Not Progressing 09/24/2018 1307 by Geraldo Docker, RN Outcome: Not Progressing Goal: Verbalization of understanding the information provided will improve 09/24/2018 1612 by Geraldo Docker, RN Outcome: Not Progressing 09/24/2018 1307 by Geraldo Docker, RN Outcome: Not Progressing   Problem: Activity: Goal: Interest or engagement in activities will improve 09/24/2018 1612 by Geraldo Docker, RN Outcome: Not Progressing 09/24/2018 1307 by Geraldo Docker, RN Outcome: Not Progressing Goal: Sleeping patterns will improve 09/24/2018 1612 by Geraldo Docker, RN Outcome: Not Progressing 09/24/2018 1307 by Geraldo Docker, RN Outcome: Not Progressing

## 2018-09-25 LAB — URINALYSIS, ROUTINE W REFLEX MICROSCOPIC
Bilirubin Urine: NEGATIVE
Glucose, UA: NEGATIVE mg/dL
Hgb urine dipstick: NEGATIVE
Ketones, ur: 5 mg/dL — AB
Leukocytes,Ua: NEGATIVE
Nitrite: NEGATIVE
Protein, ur: NEGATIVE mg/dL
Specific Gravity, Urine: 1.028 (ref 1.005–1.030)
pH: 6 (ref 5.0–8.0)

## 2018-09-25 LAB — CBC WITH DIFFERENTIAL/PLATELET
Basophils Absolute: 0 10*3/uL (ref 0.0–0.2)
Basos: 0 %
EOS (ABSOLUTE): 0.1 10*3/uL (ref 0.0–0.4)
Eos: 1 %
Hematocrit: 45.2 % (ref 34.0–46.6)
Hemoglobin: 16 g/dL — ABNORMAL HIGH (ref 11.1–15.9)
Immature Grans (Abs): 0 10*3/uL (ref 0.0–0.1)
Immature Granulocytes: 0 %
Lymphocytes Absolute: 2.3 10*3/uL (ref 0.7–3.1)
Lymphs: 37 %
MCH: 33.9 pg — ABNORMAL HIGH (ref 26.6–33.0)
MCHC: 35.4 g/dL (ref 31.5–35.7)
MCV: 96 fL (ref 79–97)
Monocytes Absolute: 0.6 10*3/uL (ref 0.1–0.9)
Monocytes: 10 %
Neutrophils Absolute: 3.1 10*3/uL (ref 1.4–7.0)
Neutrophils: 52 %
Platelets: 140 10*3/uL — ABNORMAL LOW (ref 150–450)
RBC: 4.72 x10E6/uL (ref 3.77–5.28)
RDW: 13.4 % (ref 11.7–15.4)
WBC: 6.1 10*3/uL (ref 3.4–10.8)

## 2018-09-25 LAB — DRUG SCREEN 10 W/CONF, SERUM
Amphetamines, IA: NEGATIVE ng/mL
Barbiturates, IA: NEGATIVE ug/mL
Benzodiazepines, IA: NEGATIVE ng/mL
Cocaine & Metabolite, IA: NEGATIVE ng/mL
Methadone, IA: NEGATIVE ng/mL
Opiates, IA: NEGATIVE ng/mL
Oxycodones, IA: NEGATIVE ng/mL
Phencyclidine, IA: NEGATIVE ng/mL
Propoxyphene, IA: NEGATIVE ng/mL

## 2018-09-25 LAB — BENZODIAZEPINES,MS,WB/SP RFX
7-Aminoclonazepam: NEGATIVE ng/mL
Alprazolam: NEGATIVE ng/mL
Benzodiazepines Confirm: NEGATIVE
Chlordiazepoxide: NEGATIVE ng/mL
Clonazepam: NEGATIVE ng/mL
Desalkylflurazepam: NEGATIVE ng/mL
Desmethylchlordiazepoxide: NEGATIVE ng/mL
Desmethyldiazepam: NEGATIVE ng/mL
Diazepam: NEGATIVE ng/mL
Flurazepam: NEGATIVE ng/mL
Lorazepam: NEGATIVE ng/mL
Midazolam: NEGATIVE ng/mL
Oxazepam: NEGATIVE ng/mL
Temazepam: NEGATIVE ng/mL
Triazolam: NEGATIVE ng/mL

## 2018-09-25 LAB — COMPREHENSIVE METABOLIC PANEL
ALT: 5 IU/L (ref 0–32)
AST: 13 IU/L (ref 0–40)
Albumin/Globulin Ratio: 2.1 (ref 1.2–2.2)
Albumin: 4.6 g/dL (ref 3.8–4.9)
Alkaline Phosphatase: 73 IU/L (ref 39–117)
BUN/Creatinine Ratio: 16 (ref 9–23)
BUN: 18 mg/dL (ref 6–24)
Bilirubin Total: 0.3 mg/dL (ref 0.0–1.2)
CO2: 17 mmol/L — ABNORMAL LOW (ref 20–29)
Calcium: 9.5 mg/dL (ref 8.7–10.2)
Chloride: 107 mmol/L — ABNORMAL HIGH (ref 96–106)
Creatinine, Ser: 1.12 mg/dL — ABNORMAL HIGH (ref 0.57–1.00)
GFR calc Af Amer: 65 mL/min/{1.73_m2} (ref >59)
GFR calc non Af Amer: 56 mL/min/{1.73_m2} — ABNORMAL LOW (ref >59)
Globulin, Total: 2.2 g/dL (ref 1.5–4.5)
Glucose: 87 mg/dL (ref 65–99)
Potassium: 3.7 mmol/L (ref 3.5–5.2)
Sodium: 142 mmol/L (ref 134–144)
Total Protein: 6.8 g/dL (ref 6.0–8.5)

## 2018-09-25 LAB — THC,MS,WB/SP RFX

## 2018-09-25 LAB — TSH: TSH: 2.48 u[IU]/mL (ref 0.450–4.500)

## 2018-09-25 LAB — 10-HYDROXYCARBAZEPINE: Oxcarbazepine SerPl-Mcnc: 11 ug/mL (ref 10–35)

## 2018-09-25 LAB — ETHANOL: Ethanol: NEGATIVE %

## 2018-09-25 LAB — TOPIRAMATE LEVEL: Topiramate Lvl: 7 ug/mL (ref 2.0–25.0)

## 2018-09-25 LAB — VALPROIC ACID LEVEL: Valproic Acid Lvl: 147 ug/mL (ref 50–100)

## 2018-09-25 MED ORDER — LISINOPRIL 20 MG PO TABS
10.0000 mg | ORAL_TABLET | Freq: Every day | ORAL | Status: DC
  Administered 2018-09-25 – 2018-09-27 (×3): 10 mg via ORAL
  Filled 2018-09-25 (×3): qty 1

## 2018-09-25 NOTE — Progress Notes (Signed)
Patient BP this morning is 166/94 pulse  75, Dr, Weber Cooks made aware no new orders received noted.

## 2018-09-25 NOTE — Plan of Care (Signed)
Patient is comfortable and well rested denies any depression or anxiety, but endorsing stress due to noise around her, patient appear to have moments in which she forgets what she wants to say, denies any delusions or hallucinations, patient denies any SI/HI/, appetite is good and sleep is continuous with out interruptions medication is taking with compliance no side effects noted ,15 minutes safety rounding  is maintained no distress.   Problem: Education: Goal: Knowledge of [place] [facility] Education information/materials will improve Outcome: Progressing Goal: Emotional status will improve Outcome: Progressing Goal: Mental status will improve Outcome: Progressing Goal: Verbalization of understanding the information provided will improve Outcome: Progressing   Problem: Activity: Goal: Interest or engagement in activities will improve Outcome: Progressing Goal: Sleeping patterns will improve Outcome: Progressing   Problem: Coping: Goal: Ability to verbalize frustrations and anger appropriately will improve Outcome: Progressing Goal: Ability to demonstrate self-control will improve Outcome: Progressing   Problem: Health Behavior/Discharge Planning: Goal: Identification of resources available to assist in meeting health care needs will improve Outcome: Progressing Goal: Compliance with treatment plan for underlying cause of condition will improve Outcome: Progressing   Problem: Physical Regulation: Goal: Ability to maintain clinical measurements within normal limits will improve Outcome: Progressing   Problem: Safety: Goal: Periods of time without injury will increase Outcome: Progressing   Problem: Education: Goal: Ability to state activities that reduce stress will improve Outcome: Progressing   Problem: Coping: Goal: Ability to identify and develop effective coping behavior will improve Outcome: Progressing   Problem: Self-Concept: Goal: Ability to identify  factors that promote anxiety will improve Outcome: Progressing Goal: Level of anxiety will decrease Outcome: Progressing Goal: Ability to modify response to factors that promote anxiety will improve Outcome: Progressing

## 2018-09-25 NOTE — Progress Notes (Signed)
[facility] MD Progress Note  09/25/2018 4:09 PM Kim [name]  MRN:  [id number] Subjective: Follow-up for this patient with seizure disorder who came into the hospital with what sounds like a delirium.  On interview today the patient seems much improved from yesterday.  She is alert and oriented and says that she is feeling normal.  Affect is still a little blunted and she still seems a little unsteady on her feet although she has not had any falls.  I spoke with her daughter by telephone with the patient's permission.  Daughter explains that the patient had been functioning normally up until about a week ago and then very acutely became, what sounds like, delirious.  Patient is currently denying any hallucinations.  Has not acted out in an aggressive or suicidal way. Principal Problem: MDD (major depressive disorder), recurrent, severe, with psychosis ([place]) Diagnosis: Principal Problem:   MDD (major depressive disorder), recurrent, severe, with psychosis ([place]) Active Problems:   Marijuana use, episodic   Epilepsy ([place])   Confusion after a seizure   PTSD (post-traumatic stress disorder)   Acute psychosis ([place])  Total Time spent with patient: 30 minutes  Past Psychiatric History: Past history of multiple hospitalizations history of depression and PTSD although the specifics of her previous treatment is a little unclear.  Past Medical History:  Past Medical History:  Diagnosis Date  . Depression   . Migraines   . Neck pain   . Seizures ([place])     Past Surgical History:  Procedure Laterality Date  . IMPLANTATION VAGAL NERVE STIMULATOR     2001, 2008, 2014   Family History:  Family History  Problem Relation Age of Onset  . Hyperlipidemia Mother   . Hypertension Mother   . Hyperlipidemia Father   . Hypertension Father   . Lung cancer Father   . Diabetes Father   . Heart disease Father    Family Psychiatric  History: See previous Social History:  Social History   Substance and Sexual  Activity  Alcohol Use Not Currently  . Alcohol/week: 0.0 standard drinks   Comment: No drinking in six weeks.  She was previously drinking daily. Recent completion of rehab.     Social History   Substance and Sexual Activity  Drug Use Yes  . Frequency: 3.0 times per week  . Types: Marijuana   Comment: Stop smoking marijuana six weeks ago.  Previously smoking daily.  Recent completion of rehab.    Social History   Socioeconomic History  . Marital status: Single    Spouse name: Not on file  . Number of children: 2  . Years of education: GED  . Highest education level: Not on file  Occupational History  . Occupation: Disabled.  Social Needs  . Financial resource strain: Not on file  . Food insecurity:    Worry: Not on file    Inability: Not on file  . Transportation needs:    Medical: Not on file    Non-medical: Not on file  Tobacco Use  . Smoking status: Former Research scientist (life sciences)  . Smokeless tobacco: Never Used  . Tobacco comment: Quit 2001  Substance and Sexual Activity  . Alcohol use: Not Currently    Alcohol/week: 0.0 standard drinks    Comment: No drinking in six weeks.  She was previously drinking daily. Recent completion of rehab.  . Drug use: Yes    Frequency: 3.0 times per week    Types: Marijuana    Comment: Stop smoking marijuana six weeks ago.  Previously smoking daily.  Recent completion of rehab.  . Sexual activity: Not on file  Lifestyle  . Physical activity:    Days per week: Not on file    Minutes per session: Not on file  . Stress: Not on file  Relationships  . Social connections:    Talks on phone: Patient refused    Gets together: Patient refused    Attends religious service: Patient refused    Active member of club or organization: Patient refused    Attends meetings of clubs or organizations: Patient refused    Relationship status: Patient refused  Other Topics Concern  . Not on file  Social History Narrative   Lives at home with husband.   2-3  cups caffeine daily.   Right-handed.   Additional Social History:                         Sleep: Fair  Appetite:  Fair  Current Medications: Current Facility-Administered Medications  Medication Dose Route Frequency Provider Last Rate Last Dose  . acetaminophen (TYLENOL) tablet 650 mg  650 mg Oral Q6H PRN Lavella Hammock, MD      . alum & mag hydroxide-simeth (MAALOX/MYLANTA) 200-200-20 MG/5ML suspension 30 mL  30 mL Oral Q4H PRN Lavella Hammock, MD      . cloBAZam (ONFI) tablet 10 mg  10 mg Oral BID Keeli Roberg, Madie Reno, MD   10 mg at 09/25/18 1425  . divalproex (DEPAKOTE ER) 24 hr tablet 1,500 mg  1,500 mg Oral QHS Lavella Hammock, MD   1,500 mg at 09/24/18 2146  . hydrOXYzine (ATARAX/VISTARIL) tablet 50 mg  50 mg Oral Q4H PRN Lavella Hammock, MD   50 mg at 09/23/18 [phone]  . ibuprofen (ADVIL,MOTRIN) tablet 800 mg  800 mg Oral TID PRN Lavella Hammock, MD      . lisinopril (PRINIVIL,ZESTRIL) tablet 10 mg  10 mg Oral Daily Marvelle Span T, MD      . OLANZapine zydis (ZYPREXA) disintegrating tablet 5 mg  5 mg Oral Q8H PRN Lavella Hammock, MD       And  . LORazepam (ATIVAN) tablet 1 mg  1 mg Oral PRN Lavella Hammock, MD       And  . ziprasidone (GEODON) injection 20 mg  20 mg Intramuscular PRN Lavella Hammock, MD      . magnesium hydroxide (MILK OF MAGNESIA) suspension 30 mL  30 mL Oral Daily PRN Lavella Hammock, MD      . ondansetron (ZOFRAN-ODT) disintegrating tablet 4 mg  4 mg Oral Q8H PRN Lavella Hammock, MD      . OXcarbazepine (TRILEPTAL) tablet 450 mg  450 mg Oral BID Shelaine Frie, Madie Reno, MD   450 mg at 09/25/18 0805  . pantoprazole (PROTONIX) EC tablet 40 mg  40 mg Oral Daily Lavella Hammock, MD   40 mg at 09/25/18 7673  . QUEtiapine (SEROQUEL) tablet 50 mg  50 mg Oral QHS Larita Fife, MD   50 mg at 09/24/18 2146  . SUMAtriptan (IMITREX) tablet 100 mg  100 mg Oral Once PRN Lavella Hammock, MD      . topiramate (TOPAMAX) tablet 150 mg  150 mg Oral BID Quante Pettry, Madie Reno,  MD   150 mg at 09/25/18 0803  . traZODone (DESYREL) tablet 50 mg  50 mg Oral QHS PRN Lavella Hammock, MD        Lab Results:  Results for orders placed or performed during the hospital encounter of 09/22/18 (from the past 48 hour(s))  Urinalysis, Routine w reflex microscopic     Status: Abnormal   Collection Time: 09/24/18  7:05 AM  Result Value Ref Range   Color, Urine YELLOW (A) YELLOW   APPearance HAZY (A) CLEAR   Specific Gravity, Urine 1.028 1.005 - 1.030   pH 6.0 5.0 - 8.0   Glucose, UA NEGATIVE NEGATIVE mg/dL   Hgb urine dipstick NEGATIVE NEGATIVE   Bilirubin Urine NEGATIVE NEGATIVE   Ketones, ur 5 (A) NEGATIVE mg/dL   Protein, ur NEGATIVE NEGATIVE mg/dL   Nitrite NEGATIVE NEGATIVE   Leukocytes,Ua NEGATIVE NEGATIVE    Comment: Performed at [facility], [address] [place], [place] [address]  CBC with Differential/Platelet     Status: Abnormal   Collection Time: 09/24/18  7:08 AM  Result Value Ref Range   WBC 4.3 4.0 - 10.5 K/uL   RBC 4.30 3.87 - 5.11 MIL/uL   Hemoglobin 13.8 12.0 - 15.0 g/dL   HCT 42.1 36.0 - 46.0 %   MCV 97.9 80.0 - 100.0 fL   MCH 32.1 26.0 - 34.0 pg   MCHC 32.8 30.0 - 36.0 g/dL   RDW 14.0 11.5 - 15.5 %   Platelets 83 (L) 150 - 400 K/uL    Comment: Immature Platelet Fraction may be clinically indicated, consider ordering this additional test [id number]    nRBC 0.0 0.0 - 0.2 %   Neutrophils Relative % 32 %   Neutro Abs 1.4 (L) 1.7 - 7.7 K/uL   Lymphocytes Relative 54 %   Lymphs Abs 2.4 0.7 - 4.0 K/uL   Monocytes Relative 11 %   Monocytes Absolute 0.5 0.1 - 1.0 K/uL   Eosinophils Relative 1 %   Eosinophils Absolute 0.1 0.0 - 0.5 K/uL   Basophils Relative 1 %   Basophils Absolute 0.0 0.0 - 0.1 K/uL   Immature Granulocytes 1 %   Abs Immature Granulocytes 0.02 0.00 - 0.07 K/uL    Comment: Performed at [facility], [address] [place], [place] [address]  Comprehensive metabolic panel     Status: Abnormal   Collection  Time: 09/24/18  7:08 AM  Result Value Ref Range   Sodium 142 135 - 145 mmol/L   Potassium 3.3 (L) 3.5 - 5.1 mmol/L   Chloride 109 98 - 111 mmol/L   CO2 23 22 - 32 mmol/L   Glucose, Bld 88 70 - 99 mg/dL   BUN 20 6 - 20 mg/dL   Creatinine, Ser 1.01 (H) 0.44 - 1.00 mg/dL   Calcium 9.0 8.9 - 10.3 mg/dL   Total Protein 7.0 6.5 - 8.1 g/dL   Albumin 3.9 3.5 - 5.0 g/dL   AST 16 15 - 41 U/L   ALT 9 0 - 44 U/L   Alkaline Phosphatase 64 38 - 126 U/L   Total Bilirubin 0.5 0.3 - 1.2 mg/dL   GFR calc non Af Amer >60 >60 mL/min   GFR calc Af Amer >60 >60 mL/min   Anion gap 10 5 - 15    Comment: Performed at [facility], [address] [place], [place] [address]  Valproic acid level     Status: Abnormal   Collection Time: 09/24/18  7:08 AM  Result Value Ref Range   Valproic Acid Lvl 132 (H) 50.0 - 100.0 ug/mL    Comment: Performed at [facility], [address] [place], [place] [address]  Blood Alcohol level:  Lab Results  Component Value Date   ETH 31 (H) 10/09/2016   ETH 114 (H) 96/78/9381    Metabolic Disorder Labs: No results found for: HGBA1C, MPG No results found for: PROLACTIN Lab Results  Component Value Date   CHOL 200 12/10/2014   TRIG 82 12/10/2014   HDL 98 12/10/2014   CHOLHDL 2.0 12/10/2014   VLDL 16 12/10/2014   LDLCALC 86 12/10/2014    Physical Findings: AIMS:  , ,  ,  ,    CIWA:  CIWA-Ar Total: 3 COWS:  COWS Total Score: 3  Musculoskeletal: Strength & Muscle Tone: within normal limits Gait & Station: unsteady Patient leans: N/A  Psychiatric Specialty Exam: Physical Exam  Nursing note and vitals reviewed. Constitutional: She appears well-developed and well-nourished.  HENT:  Head: Normocephalic and atraumatic.  Eyes: Pupils are equal, round, and reactive to light. Conjunctivae are normal.  Neck: Normal range of motion.  Cardiovascular: Normal heart sounds.  Respiratory: Effort normal.  GI: Soft.  Musculoskeletal: Normal  range of motion.  Neurological: She is alert.  Skin: Skin is warm and dry.  Psychiatric: Judgment normal. Her affect is blunt. Her speech is delayed. She is slowed. She expresses no homicidal and no suicidal ideation. She exhibits abnormal recent memory.    Review of Systems  Constitutional: Negative.   HENT: Negative.   Eyes: Negative.   Respiratory: Negative.   Cardiovascular: Negative.   Gastrointestinal: Negative.   Musculoskeletal: Negative.   Skin: Negative.   Neurological: Negative.   Psychiatric/Behavioral: Positive for memory loss. Negative for depression, hallucinations, substance abuse and suicidal ideas.    Blood pressure (!) 166/94, pulse 74, temperature 97.6 F (36.4 C), temperature source Oral, resp. rate 16, height 5\' 6"  (1.676 m), weight 65.8 kg, SpO2 100 %.Body mass index is 23.4 kg/m.  General Appearance: Casual  Eye Contact:  Fair  Speech:  Slow  Volume:  Decreased  Mood:  Anxious and Dysphoric  Affect:  Constricted  Thought Process:  Coherent  Orientation:  Full (Time, Place, and Person)  Thought Content:  Rumination  Suicidal Thoughts:  No  Homicidal Thoughts:  No  Memory:  Immediate;   Fair Recent;   Poor Remote;   Fair  Judgement:  Impaired  Insight:  Shallow  Psychomotor Activity:  Decreased  Concentration:  Concentration: Poor  Recall:  [employer] of Knowledge:  Fair  Language:  Fair  Akathisia:  No  Handed:  Right  AIMS (if indicated):     Assets:  Desire for Improvement Financial Resources/Insurance Housing Resilience Social Support  ADL's:  Impaired  Cognition:  Impaired,  Mild  Sleep:  Number of Hours: 6.75     Treatment Plan Summary: Daily contact with patient to assess and evaluate symptoms and progress in treatment, Medication management and Plan Patient seems significantly improved today although still a little unsteady on her feet with some slow and sluggish speech.  Spoke with her daughter who reports that this is happened  several times over the last year or 2.  The abrupt onset and the prominence of what sounds like delirium symptoms really suggest to me that this is more of a medical delirium than a primary mood disorder.  I think the 2 most likely explanations are either seizures with postictal delirium or some kind of mismanagement of her medication either too much or then a abrupt withdrawal from these medicines particularly the onfi.  Patient seems to be stabilizing.  No change to medicine  today.  I still think she is not quite back to baseline and the family appears to agree.  Discharge will tentatively be planned for Monday based on the assumption that she continues to improve.  Patient has been running a somewhat high blood pressure.  I am starting her on a low-dose of lisinopril.  Alethia Berthold, MD 09/25/2018, 4:09 PM

## 2018-09-25 NOTE — Plan of Care (Signed)
Patient is alert and oriented X 3, denies SI, HI and AVH today. Patient has some mild confusion. Patient is not as isolative as previous days, does come out of room and socialize a little with peers. Patient seems to be eating a little better than previous days. Patient affect is flat, but pleasant and cooperative, takes medications appropriately, when pharmacy brings medications on time. No self injurious behaviors observed. Safety checks Q 15 minutes will continue. Problem: Education: Goal: Knowledge of [place] General Education information/materials will improve Outcome: Progressing Goal: Emotional status will improve Outcome: Progressing Goal: Mental status will improve Outcome: Progressing Goal: Verbalization of understanding the information provided will improve Outcome: Progressing   Problem: Activity: Goal: Interest or engagement in activities will improve Outcome: Progressing Goal: Sleeping patterns will improve Outcome: Progressing   Problem: Coping: Goal: Ability to verbalize frustrations and anger appropriately will improve Outcome: Progressing Goal: Ability to demonstrate self-control will improve Outcome: Progressing

## 2018-09-25 NOTE — Progress Notes (Signed)
Recreation Therapy Notes    Date: 09/25/2018  Time: 9:30 am   Location: Craft room   Behavioral response: N/A   Intervention Topic: Time Management  Discussion/Intervention: Patient did not attend group.   Clinical Observations/Feedback:  Patient did not attend group.   Mahlon Gabrielle LRT/CTRS          Kymberly Blomberg 09/25/2018 11:16 AM

## 2018-09-26 DIAGNOSIS — F05 Delirium due to known physiological condition: Secondary | ICD-10-CM

## 2018-09-26 DIAGNOSIS — F23 Brief psychotic disorder: Secondary | ICD-10-CM

## 2018-09-26 LAB — CBC WITH DIFFERENTIAL/PLATELET
Abs Immature Granulocytes: 0.04 10*3/uL (ref 0.00–0.07)
Basophils Absolute: 0 10*3/uL (ref 0.0–0.1)
Basophils Relative: 0 %
Eosinophils Absolute: 0 10*3/uL (ref 0.0–0.5)
Eosinophils Relative: 1 %
HCT: 41.9 % (ref 36.0–46.0)
Hemoglobin: 14.2 g/dL (ref 12.0–15.0)
Immature Granulocytes: 1 %
Lymphocytes Relative: 19 %
Lymphs Abs: 1.2 10*3/uL (ref 0.7–4.0)
MCH: 32.6 pg (ref 26.0–34.0)
MCHC: 33.9 g/dL (ref 30.0–36.0)
MCV: 96.3 fL (ref 80.0–100.0)
Monocytes Absolute: 0.7 10*3/uL (ref 0.1–1.0)
Monocytes Relative: 11 %
Neutro Abs: 4.2 10*3/uL (ref 1.7–7.7)
Neutrophils Relative %: 68 %
Platelets: 120 10*3/uL — ABNORMAL LOW (ref 150–400)
RBC: 4.35 MIL/uL (ref 3.87–5.11)
RDW: 14.1 % (ref 11.5–15.5)
WBC: 6.1 10*3/uL (ref 4.0–10.5)
nRBC: 0 % (ref 0.0–0.2)

## 2018-09-26 NOTE — Plan of Care (Signed)
Patient knowledgeable  of information  received   aware of unit programing  Encourage  to  Work coping skills . Compliant  with medications. No anger outburst. Voice of no safety concerns. Mental status and emotional status  improving . Able  to verbalize feelings . Limited interaction with peers .  Anxiety  decrease able to identify factors that  bring on anxiety .  Voice no concerns around sleep.   Problem: Education: Goal: Knowledge of [place] [facility] Education information/materials will improve Outcome: Progressing Goal: Emotional status will improve Outcome: Progressing Goal: Mental status will improve Outcome: Progressing Goal: Verbalization of understanding the information provided will improve Outcome: Progressing   Problem: Activity: Goal: Interest or engagement in activities will improve Outcome: Progressing Goal: Sleeping patterns will improve Outcome: Progressing   Problem: Coping: Goal: Ability to verbalize frustrations and anger appropriately will improve Outcome: Progressing Goal: Ability to demonstrate self-control will improve Outcome: Progressing   Problem: Health Behavior/Discharge Planning: Goal: Identification of resources available to assist in meeting health care needs will improve Outcome: Progressing Goal: Compliance with treatment plan for underlying cause of condition will improve Outcome: Progressing   Problem: Physical Regulation: Goal: Ability to maintain clinical measurements within normal limits will improve Outcome: Progressing   Problem: Safety: Goal: Periods of time without injury will increase Outcome: Progressing   Problem: Education: Goal: Ability to state activities that reduce stress will improve Outcome: Progressing   Problem: Coping: Goal: Ability to identify and develop effective coping behavior will improve Outcome: Progressing   Problem: Self-Concept: Goal: Ability to identify factors that promote anxiety will  improve Outcome: Progressing Goal: Level of anxiety will decrease Outcome: Progressing Goal: Ability to modify response to factors that promote anxiety will improve Outcome: Progressing

## 2018-09-26 NOTE — [facility] Group Notes (Signed)
[facility] Group Notes:  (Nursing/MHT/Case Management/Adjunct)  Date:  09/26/2018  Time:  9:23 PM  Type of Therapy:  Group Therapy  Participation Level:  Active  Participation Quality:  Sharing  Affect:  Appropriate  Cognitive:  Confused  Insight:  Good  Engagement in Group:  Supportive  Modes of Intervention:  Support  Summary of Progress/Problems:  Kim [name] 09/26/2018, 9:23 PM

## 2018-09-26 NOTE — Plan of Care (Signed)
D: Patient has been complaining of headache and asked for her injection she gets for her migraines once per month. Explained to patient that we do not have that medication in pharmacy and it is not ordered for her. She requested and received Imitrex per prn order shortly after shift change, between 2000 and 2030. She complained that another patient's hypermania was overstimulating her and making her anxious and causing her headache. She said "look, she has the best bed in the place--if she won't get in it--I will". Patient was redirected away from the other patient. Denies any other complaints. Asking about clothes that were supposedly brought by her husband. Security denies any belongings were brought for her. Denies SI, HI and AV hallucinations. A: Continue to monitor and offer support. R: No further complaints of pain. Safety maintained.

## 2018-09-26 NOTE — Progress Notes (Signed)
[facility] MD Progress Note  09/26/2018 10:51 AM Kim [name]  MRN:  [id number] Subjective:  Kim [name] is a 53yo F with a history of major depressive disorder versus bipolar affective disorder, PTSD,alcohol use disorder, marijuana use disorder borderline personality disorder, and a medical history of seizure disorder, who was admitted to [place] unit for manic state with acute psychosis threatening self and family.  Today patient appears to continue to improve - she is visible in the unit, interacting with others, able to vocalize her needs. She reports feeling "better", she reports "fair" mood, denies having any thoughts of harming self or others. She denies any hallucinations and does not express any delusions. She is able to tell correct place, year and month, not day. Her only physical complaint today - feeling thirsty.  Principal Problem: MDD (major depressive disorder), recurrent, severe, with psychosis ([place]) Diagnosis: Principal Problem:   MDD (major depressive disorder), recurrent, severe, with psychosis ([place]) Active Problems:   Marijuana use, episodic   Epilepsy ([place])   Confusion after a seizure   PTSD (post-traumatic stress disorder)   Acute psychosis ([place])  Total Time spent with patient: 15 minutes  Past Psychiatric History: see H&P on admission.  Past Medical History:  Past Medical History:  Diagnosis Date  . Depression   . Migraines   . Neck pain   . Seizures ([place])     Past Surgical History:  Procedure Laterality Date  . IMPLANTATION VAGAL NERVE STIMULATOR     2001, 2008, 2014   Family History:  Family History  Problem Relation Age of Onset  . Hyperlipidemia Mother   . Hypertension Mother   . Hyperlipidemia Father   . Hypertension Father   . Lung cancer Father   . Diabetes Father   . Heart disease Father    Family Psychiatric  History: see initial H&P Social History:  Social History   Substance and Sexual Activity  Alcohol Use Not Currently   . Alcohol/week: 0.0 standard drinks   Comment: No drinking in six weeks.  She was previously drinking daily. Recent completion of rehab.     Social History   Substance and Sexual Activity  Drug Use Yes  . Frequency: 3.0 times per week  . Types: Marijuana   Comment: Stop smoking marijuana six weeks ago.  Previously smoking daily.  Recent completion of rehab.    Social History   Socioeconomic History  . Marital status: Single    Spouse name: Not on file  . Number of children: 2  . Years of education: GED  . Highest education level: Not on file  Occupational History  . Occupation: Disabled.  Social Needs  . Financial resource strain: Not on file  . Food insecurity:    Worry: Not on file    Inability: Not on file  . Transportation needs:    Medical: Not on file    Non-medical: Not on file  Tobacco Use  . Smoking status: Former Research scientist (life sciences)  . Smokeless tobacco: Never Used  . Tobacco comment: Quit 2001  Substance and Sexual Activity  . Alcohol use: Not Currently    Alcohol/week: 0.0 standard drinks    Comment: No drinking in six weeks.  She was previously drinking daily. Recent completion of rehab.  . Drug use: Yes    Frequency: 3.0 times per week    Types: Marijuana    Comment: Stop smoking marijuana six weeks ago.  Previously smoking daily.  Recent completion of rehab.  . Sexual activity: Not  on file  Lifestyle  . Physical activity:    Days per week: Not on file    Minutes per session: Not on file  . Stress: Not on file  Relationships  . Social connections:    Talks on phone: Patient refused    Gets together: Patient refused    Attends religious service: Patient refused    Active member of club or organization: Patient refused    Attends meetings of clubs or organizations: Patient refused    Relationship status: Patient refused  Other Topics Concern  . Not on file  Social History Narrative   Lives at home with husband.   2-3 cups caffeine daily.   Right-handed.    Additional Social History:                         Sleep: Fair  Appetite:  Fair  Current Medications: Current Facility-Administered Medications  Medication Dose Route Frequency Provider Last Rate Last Dose  . acetaminophen (TYLENOL) tablet 650 mg  650 mg Oral Q6H PRN Lavella Hammock, MD      . alum & mag hydroxide-simeth (MAALOX/MYLANTA) 200-200-20 MG/5ML suspension 30 mL  30 mL Oral Q4H PRN Lavella Hammock, MD      . cloBAZam (ONFI) tablet 10 mg  10 mg Oral BID Clapacs, Madie Reno, MD   10 mg at 09/26/18 0844  . divalproex (DEPAKOTE ER) 24 hr tablet 1,500 mg  1,500 mg Oral QHS Lavella Hammock, MD   1,500 mg at 09/25/18 2121  . hydrOXYzine (ATARAX/VISTARIL) tablet 50 mg  50 mg Oral Q4H PRN Lavella Hammock, MD   50 mg at 09/23/18 [phone]  . ibuprofen (ADVIL,MOTRIN) tablet 800 mg  800 mg Oral TID PRN Lavella Hammock, MD      . lisinopril (PRINIVIL,ZESTRIL) tablet 10 mg  10 mg Oral Daily Clapacs, Madie Reno, MD   10 mg at 09/26/18 0847  . OLANZapine zydis (ZYPREXA) disintegrating tablet 5 mg  5 mg Oral Q8H PRN Lavella Hammock, MD       And  . LORazepam (ATIVAN) tablet 1 mg  1 mg Oral PRN Lavella Hammock, MD       And  . ziprasidone (GEODON) injection 20 mg  20 mg Intramuscular PRN Lavella Hammock, MD      . magnesium hydroxide (MILK OF MAGNESIA) suspension 30 mL  30 mL Oral Daily PRN Lavella Hammock, MD      . ondansetron (ZOFRAN-ODT) disintegrating tablet 4 mg  4 mg Oral Q8H PRN Lavella Hammock, MD      . OXcarbazepine (TRILEPTAL) tablet 450 mg  450 mg Oral BID Clapacs, Madie Reno, MD   450 mg at 09/26/18 0848  . pantoprazole (PROTONIX) EC tablet 40 mg  40 mg Oral Daily Lavella Hammock, MD   40 mg at 09/26/18 0845  . QUEtiapine (SEROQUEL) tablet 50 mg  50 mg Oral QHS Larita Fife, MD   50 mg at 09/25/18 2121  . SUMAtriptan (IMITREX) tablet 100 mg  100 mg Oral Once PRN Lavella Hammock, MD   100 mg at 09/25/18 2033  . topiramate (TOPAMAX) tablet 150 mg  150 mg Oral BID Clapacs,  Madie Reno, MD   150 mg at 09/26/18 0845  . traZODone (DESYREL) tablet 50 mg  50 mg Oral QHS PRN Lavella Hammock, MD        Lab Results: No results found for this or any  previous visit (from the past 48 hour(s)).  Blood Alcohol level:  Lab Results  Component Value Date   ETH 31 (H) 10/09/2016   ETH 114 (H) 16/12/3708    Metabolic Disorder Labs: No results found for: HGBA1C, MPG No results found for: PROLACTIN Lab Results  Component Value Date   CHOL 200 12/10/2014   TRIG 82 12/10/2014   HDL 98 12/10/2014   CHOLHDL 2.0 12/10/2014   VLDL 16 12/10/2014   LDLCALC 86 12/10/2014    Physical Findings: AIMS:  , ,  ,  ,    CIWA:  CIWA-Ar Total: 3 COWS:  COWS Total Score: 3  Musculoskeletal: Strength & Muscle Tone: within normal limits Gait & Station: unsteady Patient leans: N/A  Psychiatric Specialty Exam: Physical Exam  ROS  Blood pressure 129/89, pulse 80, temperature 97.6 F (36.4 C), temperature source Oral, resp. rate 18, height 5\' 6"  (1.676 m), weight 65.8 kg, SpO2 100 %.Body mass index is 23.4 kg/m.  General Appearance: Casual  Eye Contact:  Good  Speech:  Normal Rate  Volume:  Normal  Mood:  Euthymic  Affect:  Appropriate and Restricted  Thought Process:  Coherent, Goal Directed and Linear  Orientation:  Full (Time, Place, and Person)  Thought Content:  Logical  Suicidal Thoughts:  No  Homicidal Thoughts:  No  Memory:  Immediate;   Fair Recent;   Fair Remote;   Fair  Judgement:  Fair  Insight:  Fair  Psychomotor Activity:  Decreased  Concentration:  Concentration: Fair and Attention Span: Fair  Recall:  [employer] of Knowledge:  Fair  Language:  Fair  Akathisia:  No  Handed:  Right  AIMS (if indicated):     Assets:  Desire for Improvement Financial Resources/Insurance Housing Social Support  ADL's:  Intact  Cognition:  WNL  Sleep:  Number of Hours: 5.75     Treatment Plan Summary: Daily contact with patient to assess and evaluate symptoms and  progress in treatment   Kim [name] is a 53yo F with a history of major depressive disorder versus bipolar affective disorder, PTSD,alcohol use disorder, marijuana use disorder borderline personality disorder, and a medical history of seizure disorder, who was admitted to [place] unit for manic state with acute psychosis threatening self and family.  Impression: Major depressive disorder, recurrent, moderate, with psychotic features. Epilepsy with recent post-ictal psychosis and mania. Cannabis use disorder.   Plan: -continue inpatient psych admission; 15-minute checks; seizure precautions; daily contact with patient to assess and evaluate symptoms and progress in treatment; psychoeducation.   -continue antiseizure medications: Depakote ER 1500mg  PO QHS; Clobazam 20mg  PO BID; Oxcarbazepine 450mg  PO BID,  Topamax 150mg  PO BID. Although the dose of Depakote prior to admission was decreased from previous, VPA level was still elevated at 132 and platelets were decreased at 83. Will recheck VPA and CBC tomorrow to monitor changes.   -continue low-dose of Seroquel 50mg  PO QHS for psychosis; this choice is relatively-safe regarding lowering seizure threshold.    -continue PRN medications.   -Disposition: to be determined. Patient lives at home and has outpatient psychiatrist    Larita Fife, MD 09/26/2018, 10:51 AM

## 2018-09-26 NOTE — Progress Notes (Signed)
D: Patient has been complaining of headache and asked for her injection she gets for her migraines once per month. Explained to patient that we do not have that medication in pharmacy and it is not ordered for her. She requested and received Imitrex per prn order shortly after shift change, between 2000 and 2030. She complained that another patient's hypermania was overstimulating her and making her anxious and causing her headache. She said "look, she has the best bed in the place--if she won't get in it--I will". Patient was redirected away from the other patient. Denies any other complaints. Asking about clothes that were supposedly brought by her husband. Security denies any belongings were brought for her. Denies SI, HI and AV hallucinations. A: Continue to monitor and offer support. R: No further complaints of pain. Safety maintained.

## 2018-09-26 NOTE — BHH Group Notes (Signed)
LCSW Group Therapy Note   09/26/2018 1:15pm   Type of Therapy and Topic:  Group Therapy:  Trust and Honesty  Participation Level:  Did Not Attend  Description of Group:    In this group patients will be asked to explore the value of being honest.  Patients will be guided to discuss their thoughts, feelings, and behaviors related to honesty and trusting in others. Patients will process together how trust and honesty relate to forming relationships with peers, family members, and self. Each patient will be challenged to identify and express feelings of being vulnerable. Patients will discuss reasons why people are dishonest and identify alternative outcomes if one was truthful (to self or others). This group will be process-oriented, with patients participating in exploration of their own experiences, giving and receiving support, and processing challenge from other group members.   Therapeutic Goals: 1. Patient will identify why honesty is important to relationships and how honesty overall affects relationships.  2. Patient will identify a situation where they lied or were lied too and the  feelings, thought process, and behaviors surrounding the situation 3. Patient will identify the meaning of being vulnerable, how that feels, and how that correlates to being honest with self and others. 4. Patient will identify situations where they could have told the truth, but instead lied and explain reasons of dishonesty.   Summary of Patient Progress Pt was invited to attend group but chose not to attend. CSW will continue to encourage pt to attend group throughout their admission.     Therapeutic Modalities:   Cognitive Behavioral Therapy Solution Focused Therapy Motivational Interviewing Brief Therapy  Ivyrose Hashman  CUEBAS-COLON, LCSW 09/26/2018 12:35 PM

## 2018-09-27 ENCOUNTER — Inpatient Hospital Stay
Admission: AD | Admit: 2018-09-27 | Discharge: 2018-10-05 | DRG: 917 | Disposition: A | Discharge status: Home Health | Payer: Medicare Other | Source: Other Acute Inpatient Hospital | Attending: Internal Medicine | Admitting: Internal Medicine

## 2018-09-27 ENCOUNTER — Encounter: Payer: Self-pay | Admitting: Internal Medicine

## 2018-09-27 ENCOUNTER — Other Ambulatory Visit: Payer: Self-pay

## 2018-09-27 DIAGNOSIS — F4310 Post-traumatic stress disorder, unspecified: Secondary | ICD-10-CM | POA: Diagnosis present

## 2018-09-27 DIAGNOSIS — E7220 Disorder of urea cycle metabolism, unspecified: Secondary | ICD-10-CM | POA: Diagnosis present

## 2018-09-27 DIAGNOSIS — R4182 Altered mental status, unspecified: Secondary | ICD-10-CM | POA: Diagnosis not present

## 2018-09-27 DIAGNOSIS — Z79899 Other long term (current) drug therapy: Secondary | ICD-10-CM | POA: Diagnosis not present

## 2018-09-27 DIAGNOSIS — D72819 Decreased white blood cell count, unspecified: Secondary | ICD-10-CM | POA: Diagnosis present

## 2018-09-27 DIAGNOSIS — T426X1A Poisoning by other antiepileptic and sedative-hypnotic drugs, accidental (unintentional), initial encounter: Principal | ICD-10-CM | POA: Diagnosis present

## 2018-09-27 DIAGNOSIS — R569 Unspecified convulsions: Secondary | ICD-10-CM | POA: Diagnosis not present

## 2018-09-27 DIAGNOSIS — F23 Brief psychotic disorder: Secondary | ICD-10-CM

## 2018-09-27 DIAGNOSIS — F319 Bipolar disorder, unspecified: Secondary | ICD-10-CM | POA: Diagnosis present

## 2018-09-27 DIAGNOSIS — R197 Diarrhea, unspecified: Secondary | ICD-10-CM | POA: Diagnosis present

## 2018-09-27 DIAGNOSIS — Z8659 Personal history of other mental and behavioral disorders: Secondary | ICD-10-CM | POA: Diagnosis not present

## 2018-09-27 DIAGNOSIS — D696 Thrombocytopenia, unspecified: Secondary | ICD-10-CM | POA: Diagnosis present

## 2018-09-27 DIAGNOSIS — Z87891 Personal history of nicotine dependence: Secondary | ICD-10-CM

## 2018-09-27 DIAGNOSIS — R441 Visual hallucinations: Secondary | ICD-10-CM | POA: Diagnosis not present

## 2018-09-27 DIAGNOSIS — G4089 Other seizures: Secondary | ICD-10-CM | POA: Diagnosis not present

## 2018-09-27 DIAGNOSIS — K7460 Unspecified cirrhosis of liver: Secondary | ICD-10-CM

## 2018-09-27 DIAGNOSIS — D649 Anemia, unspecified: Secondary | ICD-10-CM | POA: Diagnosis present

## 2018-09-27 DIAGNOSIS — G40909 Epilepsy, unspecified, not intractable, without status epilepticus: Secondary | ICD-10-CM | POA: Diagnosis present

## 2018-09-27 DIAGNOSIS — I10 Essential (primary) hypertension: Secondary | ICD-10-CM | POA: Diagnosis not present

## 2018-09-27 DIAGNOSIS — F329 Major depressive disorder, single episode, unspecified: Secondary | ICD-10-CM | POA: Diagnosis not present

## 2018-09-27 DIAGNOSIS — T426X1D Poisoning by other antiepileptic and sedative-hypnotic drugs, accidental (unintentional), subsequent encounter: Secondary | ICD-10-CM | POA: Diagnosis not present

## 2018-09-27 DIAGNOSIS — G92 Toxic encephalopathy: Secondary | ICD-10-CM | POA: Diagnosis present

## 2018-09-27 DIAGNOSIS — R440 Auditory hallucinations: Secondary | ICD-10-CM | POA: Diagnosis not present

## 2018-09-27 DIAGNOSIS — F05 Delirium due to known physiological condition: Secondary | ICD-10-CM

## 2018-09-27 LAB — CBC
HCT: 43.1 % (ref 36.0–46.0)
Hemoglobin: 14.3 g/dL (ref 12.0–15.0)
MCH: 32.1 pg (ref 26.0–34.0)
MCHC: 33.2 g/dL (ref 30.0–36.0)
MCV: 96.6 fL (ref 80.0–100.0)
Platelets: 127 10*3/uL — ABNORMAL LOW (ref 150–400)
RBC: 4.46 MIL/uL (ref 3.87–5.11)
RDW: 14.6 % (ref 11.5–15.5)
WBC: 3.5 10*3/uL — ABNORMAL LOW (ref 4.0–10.5)
nRBC: 0 % (ref 0.0–0.2)

## 2018-09-27 LAB — AMMONIA: Ammonia: 45 umol/L — ABNORMAL HIGH (ref 9–35)

## 2018-09-27 LAB — HEPATIC FUNCTION PANEL
ALT: 10 U/L (ref 0–44)
AST: 14 U/L — ABNORMAL LOW (ref 15–41)
Albumin: 3.6 g/dL (ref 3.5–5.0)
Alkaline Phosphatase: 53 U/L (ref 38–126)
Bilirubin, Direct: 0.2 mg/dL (ref 0.0–0.2)
Indirect Bilirubin: 0.1 mg/dL — ABNORMAL LOW (ref 0.3–0.9)
Total Bilirubin: 0.3 mg/dL (ref 0.3–1.2)
Total Protein: 6 g/dL — ABNORMAL LOW (ref 6.5–8.1)

## 2018-09-27 LAB — BASIC METABOLIC PANEL
Anion gap: 11 (ref 5–15)
BUN: 23 mg/dL — ABNORMAL HIGH (ref 6–20)
CO2: 21 mmol/L — ABNORMAL LOW (ref 22–32)
Calcium: 8.7 mg/dL — ABNORMAL LOW (ref 8.9–10.3)
Chloride: 106 mmol/L (ref 98–111)
Creatinine, Ser: 1.2 mg/dL — ABNORMAL HIGH (ref 0.44–1.00)
GFR calc Af Amer: 60 mL/min — ABNORMAL LOW (ref >60)
GFR calc non Af Amer: 52 mL/min — ABNORMAL LOW (ref >60)
Glucose, Bld: 92 mg/dL (ref 70–99)
Potassium: 3.1 mmol/L — ABNORMAL LOW (ref 3.5–5.1)
Sodium: 138 mmol/L (ref 135–145)

## 2018-09-27 LAB — GLUCOSE, CAPILLARY: Glucose-Capillary: 62 mg/dL — ABNORMAL LOW (ref 70–99)

## 2018-09-27 LAB — VALPROIC ACID LEVEL: Valproic Acid Lvl: 183 ug/mL (ref 50.0–100.0)

## 2018-09-27 MED ORDER — ENOXAPARIN SODIUM 40 MG/0.4ML ~~LOC~~ SOLN
40.0000 mg | SUBCUTANEOUS | Status: DC
  Administered 2018-09-27 – 2018-10-04 (×7): 40 mg via SUBCUTANEOUS
  Filled 2018-09-27 (×7): qty 0.4

## 2018-09-27 MED ORDER — ACETAMINOPHEN 650 MG RE SUPP: 650.0000 mg | Freq: Four times a day (QID) | RECTAL | Status: DC | PRN

## 2018-09-27 MED ORDER — POTASSIUM CHLORIDE CRYS ER 20 MEQ PO TBCR
40.0000 meq | EXTENDED_RELEASE_TABLET | ORAL | Status: AC
  Administered 2018-09-27: 22:00:00 40 meq via ORAL
  Filled 2018-09-27: qty 2

## 2018-09-27 MED ORDER — ACETAMINOPHEN 325 MG PO TABS: 650.0000 mg | ORAL_TABLET | Freq: Four times a day (QID) | ORAL | 0 refills | Status: AC | PRN

## 2018-09-27 MED ORDER — OXCARBAZEPINE 300 MG PO TABS
450.0000 mg | ORAL_TABLET | Freq: Two times a day (BID) | ORAL | Status: DC
  Administered 2018-09-27 – 2018-10-05 (×16): 450 mg via ORAL
  Filled 2018-09-27 (×18): qty 1

## 2018-09-27 MED ORDER — TOPIRAMATE 25 MG PO TABS
150.0000 mg | ORAL_TABLET | Freq: Two times a day (BID) | ORAL | Status: DC
  Administered 2018-09-27 – 2018-10-05 (×17): 150 mg via ORAL
  Filled 2018-09-27 (×20): qty 2

## 2018-09-27 MED ORDER — LISINOPRIL 10 MG PO TABS: 10.0000 mg | ORAL_TABLET | Freq: Every day | ORAL | 0 refills | Status: DC

## 2018-09-27 MED ORDER — SODIUM CHLORIDE 0.9 % IV SOLN
INTRAVENOUS | Status: DC
  Administered 2018-09-27 – 2018-10-02 (×11): via INTRAVENOUS

## 2018-09-27 MED ORDER — ONDANSETRON HCL 4 MG PO TABS: 4.0000 mg | ORAL_TABLET | Freq: Four times a day (QID) | ORAL | Status: DC | PRN

## 2018-09-27 MED ORDER — OXCARBAZEPINE 150 MG PO TABS: 450.0000 mg | ORAL_TABLET | Freq: Two times a day (BID) | ORAL | 0 refills | Status: DC

## 2018-09-27 MED ORDER — QUETIAPINE FUMARATE 25 MG PO TABS
50.0000 mg | ORAL_TABLET | Freq: Every day | ORAL | Status: DC
  Administered 2018-09-28: 50 mg via ORAL
  Filled 2018-09-27: qty 2

## 2018-09-27 MED ORDER — ONDANSETRON HCL 4 MG/2ML IJ SOLN: 4.0000 mg | Freq: Four times a day (QID) | INTRAMUSCULAR | Status: DC | PRN

## 2018-09-27 MED ORDER — SODIUM CHLORIDE 0.9% FLUSH
3.0000 mL | Freq: Two times a day (BID) | INTRAVENOUS | Status: DC
  Administered 2018-09-28 – 2018-10-05 (×13): 3 mL via INTRAVENOUS

## 2018-09-27 MED ORDER — SUMATRIPTAN SUCCINATE 100 MG PO TABS: 100.0000 mg | ORAL_TABLET | Freq: Once | ORAL | 0 refills | Status: DC | PRN

## 2018-09-27 MED ORDER — ALBUTEROL SULFATE (2.5 MG/3ML) 0.083% IN NEBU: 2.5000 mg | INHALATION_SOLUTION | RESPIRATORY_TRACT | Status: DC | PRN

## 2018-09-27 MED ORDER — ACETAMINOPHEN 325 MG PO TABS
650.0000 mg | ORAL_TABLET | Freq: Four times a day (QID) | ORAL | Status: DC | PRN
  Administered 2018-10-01 – 2018-10-05 (×5): 650 mg via ORAL
  Filled 2018-09-27 (×5): qty 2

## 2018-09-27 MED ORDER — PANTOPRAZOLE SODIUM 40 MG PO TBEC
40.0000 mg | DELAYED_RELEASE_TABLET | Freq: Every day | ORAL | Status: DC
  Administered 2018-09-28 – 2018-10-05 (×8): 40 mg via ORAL
  Filled 2018-09-27 (×8): qty 1

## 2018-09-27 MED ORDER — PANTOPRAZOLE SODIUM 40 MG PO TBEC: 40.0000 mg | DELAYED_RELEASE_TABLET | Freq: Every day | ORAL | 0 refills | Status: DC

## 2018-09-27 MED ORDER — TOPIRAMATE 50 MG PO TABS: 150.0000 mg | ORAL_TABLET | Freq: Two times a day (BID) | ORAL | 0 refills | Status: DC

## 2018-09-27 MED ORDER — QUETIAPINE FUMARATE 50 MG PO TABS: 50.0000 mg | ORAL_TABLET | Freq: Every day | ORAL | 0 refills | Status: AC

## 2018-09-27 MED ORDER — POLYETHYLENE GLYCOL 3350 17 G PO PACK: 17.0000 g | PACK | Freq: Every day | ORAL | Status: DC | PRN

## 2018-09-27 NOTE — Progress Notes (Signed)
Patient ID: Kim [name], female   DOB: Oct 16, 1965, 53 y.o.   MRN: [id number]  Discharge Note:  Patient denies SI/HI/AVH at this time. Discharge instructions, AVS, and transition record gone over with patient. Patient agrees to comply with medication management, follow-up visit, and outpatient therapy. Patient belongings returned to patient. Patient questions and concerns addressed and answered. Patient wheeled off unit. Patient discharged to 2A room 245.

## 2018-09-27 NOTE — Progress Notes (Signed)
Lab just called and notified this writer that patient's Valproic acid level is 183. This Probation officer will notify MD.

## 2018-09-27 NOTE — BHH Group Notes (Signed)
LCSW Group Therapy Note 09/27/2018 1:15pm  Type of Therapy and Topic: Group Therapy: Feelings Around Returning Home & Establishing a Supportive Framework and Supporting Oneself When Supports Not Available  Participation Level: Did Not Attend  Description of Group:  Patients first processed thoughts and feelings about upcoming discharge. These included fears of upcoming changes, lack of change, new living environments, judgements and expectations from others and overall stigma of mental health issues. The group then discussed the definition of a supportive framework, what that looks and feels like, and how do to discern it from an unhealthy non-supportive network. The group identified different types of supports as well as what to do when your family/friends are less than helpful or unavailable  Therapeutic Goals  1. Patient will identify one healthy supportive network that they can use at discharge. 2. Patient will identify one factor of a supportive framework and how to tell it from an unhealthy network. 3. Patient able to identify one coping skill to use when they do not have positive supports from others. 4. Patient will demonstrate ability to communicate their needs through discussion and/or role plays.  Summary of Patient Progress:   Therapeutic Modalities Cognitive Behavioral Therapy Motivational Interviewing   Kim [name]  CUEBAS-COLON, LCSW 09/27/2018 11:14 AM

## 2018-09-27 NOTE — Progress Notes (Signed)
[facility] MD Progress Note  09/27/2018 1:41 PM Niya Behler  MRN:  [id number] Subjective:  Ms. Montecalvo is a 53yo F with a history of major depressive disorder versus bipolar affective disorder, PTSD,alcohol use disorder, marijuana use disorder borderline personality disorder, and a medical history of seizure disorder, who was admitted to [place] unit for manic state with acute psychosis threatening self and family.  Today patient appears confused and unsteady while walking. She reports feeling tired. She reports feeling "fair" mood, denies feeling depressed, denies having any thoughts of harming self or others. She denies any hallucinations and does not express any delusions.   VPA level today toxic at 183. STAT orders for LFT and ammonia level placed. Hospitalist consult requested.    Principal Problem: MDD (major depressive disorder), recurrent, severe, with psychosis ([place]) Diagnosis: Principal Problem:   MDD (major depressive disorder), recurrent, severe, with psychosis ([place]) Active Problems:   Marijuana use, episodic   Epilepsy ([place])   Confusion after a seizure   PTSD (post-traumatic stress disorder)   Acute psychosis ([place])  Total Time spent with patient: 15 minutes  Past Psychiatric History: see H&P on admission.  Past Medical History:  Past Medical History:  Diagnosis Date  . Depression   . Migraines   . Neck pain   . Seizures ([place])     Past Surgical History:  Procedure Laterality Date  . IMPLANTATION VAGAL NERVE STIMULATOR     2001, 2008, 2014   Family History:  Family History  Problem Relation Age of Onset  . Hyperlipidemia Mother   . Hypertension Mother   . Hyperlipidemia Father   . Hypertension Father   . Lung cancer Father   . Diabetes Father   . Heart disease Father    Family Psychiatric  History: see initial H&P Social History:  Social History   Substance and Sexual Activity  Alcohol Use Not Currently  . Alcohol/week: 0.0 standard drinks    Comment: No drinking in six weeks.  She was previously drinking daily. Recent completion of rehab.     Social History   Substance and Sexual Activity  Drug Use Yes  . Frequency: 3.0 times per week  . Types: Marijuana   Comment: Stop smoking marijuana six weeks ago.  Previously smoking daily.  Recent completion of rehab.    Social History   Socioeconomic History  . Marital status: Single    Spouse name: Not on file  . Number of children: 2  . Years of education: GED  . Highest education level: Not on file  Occupational History  . Occupation: Disabled.  Social Needs  . Financial resource strain: Not on file  . Food insecurity:    Worry: Not on file    Inability: Not on file  . Transportation needs:    Medical: Not on file    Non-medical: Not on file  Tobacco Use  . Smoking status: Former Research scientist (life sciences)  . Smokeless tobacco: Never Used  . Tobacco comment: Quit 2001  Substance and Sexual Activity  . Alcohol use: Not Currently    Alcohol/week: 0.0 standard drinks    Comment: No drinking in six weeks.  She was previously drinking daily. Recent completion of rehab.  . Drug use: Yes    Frequency: 3.0 times per week    Types: Marijuana    Comment: Stop smoking marijuana six weeks ago.  Previously smoking daily.  Recent completion of rehab.  . Sexual activity: Not on file  Lifestyle  . Physical activity:  Days per week: Not on file    Minutes per session: Not on file  . Stress: Not on file  Relationships  . Social connections:    Talks on phone: Patient refused    Gets together: Patient refused    Attends religious service: Patient refused    Active member of club or organization: Patient refused    Attends meetings of clubs or organizations: Patient refused    Relationship status: Patient refused  Other Topics Concern  . Not on file  Social History Narrative   Lives at home with husband.   2-3 cups caffeine daily.   Right-handed.   Additional Social History:                          Sleep: Fair  Appetite:  Fair  Current Medications: Current Facility-Administered Medications  Medication Dose Route Frequency Provider Last Rate Last Dose  . acetaminophen (TYLENOL) tablet 650 mg  650 mg Oral Q6H PRN Lavella Hammock, MD      . alum & mag hydroxide-simeth (MAALOX/MYLANTA) 200-200-20 MG/5ML suspension 30 mL  30 mL Oral Q4H PRN Lavella Hammock, MD      . cloBAZam (ONFI) tablet 10 mg  10 mg Oral BID Clapacs, Madie Reno, MD   10 mg at 09/27/18 1004  . hydrOXYzine (ATARAX/VISTARIL) tablet 50 mg  50 mg Oral Q4H PRN Lavella Hammock, MD   50 mg at 09/23/18 [phone]  . ibuprofen (ADVIL,MOTRIN) tablet 800 mg  800 mg Oral TID PRN Lavella Hammock, MD      . lisinopril (PRINIVIL,ZESTRIL) tablet 10 mg  10 mg Oral Daily Clapacs, Madie Reno, MD   10 mg at 09/27/18 1003  . OLANZapine zydis (ZYPREXA) disintegrating tablet 5 mg  5 mg Oral Q8H PRN Lavella Hammock, MD       And  . LORazepam (ATIVAN) tablet 1 mg  1 mg Oral PRN Lavella Hammock, MD       And  . ziprasidone (GEODON) injection 20 mg  20 mg Intramuscular PRN Lavella Hammock, MD      . magnesium hydroxide (MILK OF MAGNESIA) suspension 30 mL  30 mL Oral Daily PRN Lavella Hammock, MD      . ondansetron (ZOFRAN-ODT) disintegrating tablet 4 mg  4 mg Oral Q8H PRN Lavella Hammock, MD      . OXcarbazepine (TRILEPTAL) tablet 450 mg  450 mg Oral BID Clapacs, Madie Reno, MD   450 mg at 09/27/18 1003  . pantoprazole (PROTONIX) EC tablet 40 mg  40 mg Oral Daily Lavella Hammock, MD   40 mg at 09/27/18 1003  . QUEtiapine (SEROQUEL) tablet 50 mg  50 mg Oral QHS Larita Fife, MD   50 mg at 09/26/18 2128  . SUMAtriptan (IMITREX) tablet 100 mg  100 mg Oral Once PRN Lavella Hammock, MD   100 mg at 09/25/18 2033  . topiramate (TOPAMAX) tablet 150 mg  150 mg Oral BID Clapacs, Madie Reno, MD   150 mg at 09/27/18 1002  . traZODone (DESYREL) tablet 50 mg  50 mg Oral QHS PRN Lavella Hammock, MD   50 mg at 09/26/18 2129    Lab Results:   Results for orders placed or performed during the hospital encounter of 09/22/18 (from the past 48 hour(s))  CBC with Differential/Platelet     Status: Abnormal   Collection Time: 09/26/18 11:18 AM  Result Value Ref Range  WBC 6.1 4.0 - 10.5 K/uL   RBC 4.35 3.87 - 5.11 MIL/uL   Hemoglobin 14.2 12.0 - 15.0 g/dL   HCT 41.9 36.0 - 46.0 %   MCV 96.3 80.0 - 100.0 fL   MCH 32.6 26.0 - 34.0 pg   MCHC 33.9 30.0 - 36.0 g/dL   RDW 14.1 11.5 - 15.5 %   Platelets 120 (L) 150 - 400 K/uL    Comment: Immature Platelet Fraction may be clinically indicated, consider ordering this additional test [id number]    nRBC 0.0 0.0 - 0.2 %   Neutrophils Relative % 68 %   Neutro Abs 4.2 1.7 - 7.7 K/uL   Lymphocytes Relative 19 %   Lymphs Abs 1.2 0.7 - 4.0 K/uL   Monocytes Relative 11 %   Monocytes Absolute 0.7 0.1 - 1.0 K/uL   Eosinophils Relative 1 %   Eosinophils Absolute 0.0 0.0 - 0.5 K/uL   Basophils Relative 0 %   Basophils Absolute 0.0 0.0 - 0.1 K/uL   Immature Granulocytes 1 %   Abs Immature Granulocytes 0.04 0.00 - 0.07 K/uL    Comment: Performed at [facility], [address] [place], [place] [address]  Valproic acid level     Status: Abnormal   Collection Time: 09/27/18  6:53 AM  Result Value Ref Range   Valproic Acid Lvl 183 (HH) 50.0 - 100.0 ug/mL    Comment: RESULT CONFIRMED BY MANUAL DILUTION FMW CRITICAL RESULT CALLED TO, READ BACK BY AND VERIFIED WITH DEMETRIA RAVENELL @1231  ON 09/27/2018 BY FMW Performed at [facility], [address] [place],  [address]     Blood Alcohol level:  Lab Results  Component Value Date   ETH 31 (H) 10/09/2016   ETH 114 (H) 62/95/2841    Metabolic Disorder Labs: No results found for: HGBA1C, MPG No results found for: PROLACTIN Lab Results  Component Value Date   CHOL 200 12/10/2014   TRIG 82 12/10/2014   HDL 98 12/10/2014   CHOLHDL 2.0 12/10/2014   VLDL 16 12/10/2014   LDLCALC 86 12/10/2014    Physical  Findings: AIMS:  , ,  ,  ,    CIWA:  CIWA-Ar Total: 3 COWS:  COWS Total Score: 3  Musculoskeletal: Strength & Muscle Tone: decreased Gait & Station: unsteady Patient leans: N/A  Psychiatric Specialty Exam: Physical Exam   ROS   Blood pressure 137/89, pulse 78, temperature 97.9 F (36.6 C), temperature source Oral, resp. rate 16, height 5\' 6"  (1.676 m), weight 65.8 kg, SpO2 100 %.Body mass index is 23.4 kg/m.  General Appearance: Casual  Eye Contact:  Good  Speech:  Normal Rate  Volume:  Normal  Mood:  Euthymic  Affect:  Appropriate and Restricted  Thought Process:  Coherent, Goal Directed and Linear  Orientation:  Place and person  Thought Content:  Logical  Suicidal Thoughts:  No  Homicidal Thoughts:  No  Memory:  Immediate;   Fair Recent;   Fair Remote;   Fair  Judgement:  Fair  Insight:  Fair  Psychomotor Activity:  Decreased  Concentration:  Concentration: Fair and Attention Span: Fair  Recall:  [employer] of Knowledge:  Fair  Language:  Fair  Akathisia:  No  Handed:  Right  AIMS (if indicated):     Assets:  Desire for Improvement Financial Resources/Insurance Housing Social Support  ADL's:  Intact  Cognition:  WNL  Sleep:  Number of Hours: 5.75     Treatment Plan Summary: Daily  contact with patient to assess and evaluate symptoms and progress in treatment   Ms. Murdy is a 53yo F with a history of major depressive disorder versus bipolar affective disorder, PTSD,alcohol use disorder, marijuana use disorder borderline personality disorder, and a medical history of seizure disorder, who was admitted to [place] unit for manic state with acute psychosis threatening self and family. Today patient reports good mood and denies any mental complaints. Denies SI/HI.  VPA level today toxic at 183. Depakote discontinued. STAT orders for LFT and ammonia level placed. Hospitalist consult requested (spoke with Dr.Sudini).  Continue antiseizure  medications: Clobazam 20mg  PO BID; Oxcarbazepine 450mg  PO BID,  Topamax 150mg  PO BID.  Continue low-dose of Seroquel 50mg  PO QHS for psychosis; this choice is relatively-safe regarding lowering seizure threshold.    Platelets level improved, compare to admission, but still lower than normal limits.   Disposition: to be determined. Patient is safe for transfer to general medical floor if needed for IVFs.    Larita Fife, MD 09/27/2018, 1:41 PM

## 2018-09-27 NOTE — H&P (Addendum)
[facility] NAME: Kim [name]    MR#:  [id number]  DATE OF BIRTH:  [birth year]  DATE OF ADMISSION:  (Not on file)  PRIMARY CARE PHYSICIAN: Patient, No Pcp Per   REQUESTING/REFERRING PHYSICIAN: Dr. Danella Sensing  CHIEF COMPLAINT:  No chief complaint on file.  Ataxia, drowsiness  HISTORY OF PRESENT ILLNESS:  Kim [name]  is a 53 y.o. female with a known history of seizures, migraines, depression admitted to behavioral health unit continues to get worse with her symptoms of ataxia, confusion, drowsiness.  Poor oral intake.  Patient on admission was found to have Depakote levels greater than 132.  Today this was repeated and found to be at 183.  Patient will be admitted to telemetry monitored unit for valproic acid toxicity.  Patient does not have any acute overdose.  This is a chronic medication of 1500 mg daily.  No recent change in dose.  She is also on other sedative medications. Unable to get history from patient. Patient was able to ambulate in the hallway earlier but was holding onto the walls.  At this time she is wanting to sleep but not able to provide history  PAST MEDICAL HISTORY:   Past Medical History:  Diagnosis Date  . Depression   . Migraines   . Neck pain   . Seizures ([place])     PAST SURGICAL HISTORY:   Past Surgical History:  Procedure Laterality Date  . IMPLANTATION VAGAL NERVE STIMULATOR     2001, 2008, 2014    SOCIAL HISTORY:   Social History   Tobacco Use  . Smoking status: Former Research scientist (life sciences)  . Smokeless tobacco: Never Used  . Tobacco comment: Quit 2001  Substance Use Topics  . Alcohol use: Not Currently    Alcohol/week: 0.0 standard drinks    Comment: No drinking in six weeks.  She was previously drinking daily. Recent completion of rehab.    FAMILY HISTORY:   Family History  Problem Relation Age of Onset  . Hyperlipidemia Mother   . Hypertension Mother   . Hyperlipidemia Father   . Hypertension  Father   . Lung cancer Father   . Diabetes Father   . Heart disease Father     DRUG ALLERGIES:   Allergies  Allergen Reactions  . Ezogabine Other (See Comments)    Unknown to patient  . Lacosamide Other (See Comments)    Unknown  . Levetiracetam Other (See Comments)    unknown  . Other Other (See Comments)    unknown unknown  . Perampanel Other (See Comments)    Unknown to patient  . Prednisone Other (See Comments)    unknown  . Tizanidine Other (See Comments)    unknown    REVIEW OF SYSTEMS:   Review of Systems  Unable to perform ROS: Mental status change    MEDICATIONS AT HOME:   Prior to Admission medications   Medication Sig Start Date End Date Taking? Authorizing Provider  acetaminophen (TYLENOL) 325 MG tablet Take 2 tablets (650 mg total) by mouth every 6 (six) hours as needed for mild pain. 09/27/18   Larita Fife, MD  Brexpiprazole (REXULTI) 0.5 MG TABS Take 0.1 mg by mouth daily.    [provider]  cloBAZam (ONFI) 10 MG tablet Take 1 tablet (10 mg total) by mouth 2 (two) times daily. 03/23/18   Marcial Pacas, MD  cloBAZam (ONFI) 20 MG tablet Take 10 mg by mouth 2 (two) times daily. 08/21/18  [provider]  diazepam (VALIUM) 5 MG tablet One as needed for post seizure confusion, may repeat once in 30 mintues 09/17/18   Marcial Pacas, MD  divalproex (DEPAKOTE ER) 500 MG 24 hr tablet Take 3 tablets (1,500 mg total) by mouth at bedtime. 12/10/17   Marcial Pacas, MD  esomeprazole (NEXIUM) 40 MG capsule Take 40mg  daily.  Future refills will be managed by PCP. 08/04/18   Marcial Pacas, MD  FLUoxetine (PROZAC) 20 MG capsule Take 20 mg by mouth every morning. 08/21/18   [provider]  FLUoxetine (PROZAC) 20 MG tablet Take 20 mg by mouth daily.    [provider]  Fremanezumab-vfrm (AJOVY) 225 MG/1.5ML SOSY Inject 225 mg into the skin every 30 (thirty) days. 12/15/17   Marcial Pacas, MD  hydrOXYzine (ATARAX/VISTARIL) 50 MG tablet Take 0.5 - 1mg  TID PRN for  anxiety and/or sleep.    [provider]  ibuprofen (ADVIL,MOTRIN) 800 MG tablet TAKE 1 TABLET THREE TIMES DAILY AS NEEDED. 12/23/17   Marcial Pacas, MD  lisinopril (PRINIVIL,ZESTRIL) 10 MG tablet Take 1 tablet (10 mg total) by mouth daily. 09/28/18   Larita Fife, MD  ondansetron (ZOFRAN-ODT) 4 MG disintegrating tablet Take 1 tablet (4 mg total) by mouth every 8 (eight) hours as needed for nausea or vomiting. 12/19/16   Marcial Pacas, MD  OXcarbazepine (TRILEPTAL) 150 MG tablet Take 3 tablets (450 mg total) by mouth 2 (two) times daily. 03/23/18   Marcial Pacas, MD  OXcarbazepine (TRILEPTAL) 150 MG tablet Take 3 tablets (450 mg total) by mouth 2 (two) times daily for 30 days. 09/27/18 10/27/18  Larita Fife, MD  Oxcarbazepine (TRILEPTAL) 300 MG tablet Take 450 mg by mouth 2 (two) times daily. 08/21/18   [provider]  pantoprazole (PROTONIX) 40 MG tablet Take 1 tablet (40 mg total) by mouth daily. 09/28/18   Larita Fife, MD  QUEtiapine (SEROQUEL) 50 MG tablet Take 1 tablet (50 mg total) by mouth at bedtime for 30 days. 09/27/18 10/27/18  Larita Fife, MD  REXULTI 1 MG TABS  08/21/18   [provider]  rOPINIRole (REQUIP) 1 MG tablet Take 1 mg by mouth at bedtime. 06/26/18   [provider]  rOPINIRole (REQUIP) 2 MG tablet  08/21/18   [provider]  SUMAtriptan (IMITREX) 100 MG tablet Take 1 tablet (100 mg total) by mouth once as needed for migraine. May repeat in 2 hours if headache persists or recurs. Do not refill in less than 30 days 12/10/17   Marcial Pacas, MD  SUMAtriptan (IMITREX) 100 MG tablet Take 1 tablet (100 mg total) by mouth once as needed for migraine. May repeat in 2 hours if headache persists or recurs. 09/27/18   Larita Fife, MD  topiramate (TOPAMAX) 50 MG tablet Take 3 tablets (150 mg total) by mouth 2 (two) times daily for 30 days. 09/27/18 10/27/18  Larita Fife, MD  Topiramate ER (TROKENDI XR) 100 MG CP24 Take 300 mg by mouth at bedtime. 03/23/18   Marcial Pacas,  MD  traZODone (DESYREL) 50 MG tablet Take 50 mg by mouth at bedtime as needed. for sleep 01/09/18   [provider]     VITAL SIGNS:  There were no vitals taken for this visit.  PHYSICAL EXAMINATION:  Physical Exam  GENERAL:  53 y.o.-year-old patient lying in the bed with no acute distress.  EYES: Pupils equal, round, reactive to light and accommodation. No scleral icterus. Extraocular muscles intact.  HEENT: Head atraumatic, normocephalic. Oropharynx and  nasopharynx clear. No oropharyngeal erythema, moist oral mucosa  NECK:  Supple, no jugular venous distention. No thyroid enlargement, no tenderness.  LUNGS: Normal breath sounds bilaterally, no wheezing, rales, rhonchi. No use of accessory muscles of respiration.  CARDIOVASCULAR: S1, S2 normal. No murmurs, rubs, or gallops.  ABDOMEN: Soft, nontender, nondistended. Bowel sounds present. No organomegaly or mass.  EXTREMITIES: No pedal edema, cyanosis, or clubbing. + 2 pedal & radial pulses b/l.   NEUROLOGIC: Following instructions but moves all 4 extremities PSYCHIATRIC: The patient is drowsy SKIN: No obvious rash, lesion, or ulcer.   LABORATORY PANEL:   CBC Recent Labs  Lab 09/26/18 1118  WBC 6.1  HGB 14.2  HCT 41.9  PLT 120*   ------------------------------------------------------------------------------------------------------------------  Chemistries  Recent Labs  Lab 09/24/18 0708 09/27/18 1345  NA 142  --   K 3.3*  --   CL 109  --   CO2 23  --   GLUCOSE 88  --   BUN 20  --   CREATININE 1.01*  --   CALCIUM 9.0  --   AST 16 14*  ALT 9 10  ALKPHOS 64 53  BILITOT 0.5 0.3   ------------------------------------------------------------------------------------------------------------------  Cardiac Enzymes No results for input(s): TROPONINI in the last 168 hours. ------------------------------------------------------------------------------------------------------------------  RADIOLOGY:  No results  found.   IMPRESSION AND PLAN:   *Valproic acid toxicity.  Levels are at 183.  Patient will be moved to medical unit with telemetry monitoring.  Start IV fluids.  Hold Depakote.  Liver function tests are stable.  Mild elevation in ammonia is due to chronic use of Depakote.  Can restart Depakote at lower dose of tho1000 milligrams daily once levels are therapeutic Repeat LFTs in the morning.  Check blood glucose level Check EKG Neurochecks Fall precautions Seizure precaution  *Depression.  Management per psychiatry team.  DVT prophylaxis with Lovenox  All the records are reviewed and case discussed with ED provider. Management plans discussed with the patient, family and they are in agreement.  CODE STATUS: Full code  TOTAL CC TIME TAKING CARE OF THIS PATIENT: 80 minutes.   Neita Carp M.D on 09/27/2018 at 4:15 PM  Between 7am to 6pm - Pager - [phone]  After 6pm go to [email or web address] - password [email or web address]  Office  [phone]  CC: Primary care physician; Patient, No Pcp Per  Note: This dictation was prepared with Dragon dictation along with smaller phrase technology. Any transcriptional errors that result from this process are unintentional.

## 2018-09-27 NOTE — Plan of Care (Signed)
D: Patient has been confused and somewhat needy. Up at 3am wanting to brush her teeth. Seemed to be disoriented to time. Denies SI, HI and AV hallucinations. She did say her husband did not want her to come home. She denied that had anything to do with her coming to the hospital.  A: Continue to monitor and offer support. R: Safety maintained.

## 2018-09-27 NOTE — Progress Notes (Signed)
Late entry for 09/25/2008 2000: Had called Dr. Danella Sensing on another patient with an elevated VPA level and made her aware that this patient's VPA level was 132 on 09/24/18. She said patient's VPA level had been elevated on admission and that Dr. Weber Cooks was aware of it and gave no orders to hold the medication.

## 2018-09-27 NOTE — Progress Notes (Signed)
D- Patient alert and oriented. Patient presents in a pleasant mood on assessment stating that "I was out" when asked how she slept last night. Patient denies SI, HI, AVH, and pain at this time. Patient also denies any signs/symptoms of depression and anxiety. Patient has just been preoccupied with "my husband said he brought me some clothes and I haven't seen them".  Patient had no stated goals for today.  A- Scheduled medications administered to patient, per MD orders. Support and encouragement provided.  Routine safety checks conducted every 15 minutes.  Patient informed to notify staff with problems or concerns.  R- No adverse drug reactions noted. Patient contracts for safety at this time. Patient compliant with medications and treatment plan. Patient receptive, calm, and cooperative. Patient interacts well with others on the unit.  Patient remains safe at this time.

## 2018-09-27 NOTE — [facility] Suicide Risk Assessment (Signed)
[facility] Admission Suicide Risk Assessment   Nursing information obtained from:  Patient Demographic factors:  Caucasian Current Mental Status:  NA Loss Factors:  NA Historical Factors:  NA Risk Reduction Factors:  NA  Total Time spent with patient: 15 minutes Principal Problem: MDD (major depressive disorder), recurrent, severe, with psychosis ([place]) Diagnosis:  Principal Problem:   MDD (major depressive disorder), recurrent, severe, with psychosis ([place]) Active Problems:   Marijuana use, episodic   Epilepsy ([place])   Confusion after a seizure   PTSD (post-traumatic stress disorder)   Acute psychosis ([place])  Subjective Data: Kim [name] is a 53yo F with a history of major depressive disorder versus bipolar affective disorder, PTSD,alcohol use disorder, marijuana use disorder borderline personality disorder, and a medical history of seizure disorder, who was admitted to [facility] health unit for manic state with acute psychosis threatening self and family. Today patient reports good mood and denies any mental complaints. Denies SI/HI.   Continued Clinical Symptoms:  Alcohol Use Disorder Identification Test Final Score (AUDIT): 6 The "Alcohol Use Disorders Identification Test", Guidelines for Use in Primary Care, Second Edition.  World Pharmacologist [facility]). Score between 0-7:  no or low risk or alcohol related problems. Score between 8-15:  moderate risk of alcohol related problems. Score between 16-19:  high risk of alcohol related problems. Score 20 or above:  warrants further diagnostic evaluation for alcohol dependence and treatment.  Musculoskeletal: Strength & Muscle Tone: decreased Gait & Station: unsteady Patient leans: N/A  Psychiatric Specialty Exam: Physical Exam   ROS   Blood pressure 137/89, pulse 78, temperature 97.9 F (36.6 C), temperature source Oral, resp. rate 16, height 5\' 6"  (1.676 m), weight 65.8 kg, SpO2 100 %.Body mass index is 23.4 kg/m.   General Appearance: Casual  Eye Contact:  Good  Speech:  Normal Rate  Volume:  Normal  Mood:  Euthymic  Affect:  Appropriate and Restricted  Thought Process:  Coherent, Goal Directed and Linear  Orientation:  Place and person  Thought Content:  Logical  Suicidal Thoughts:  No  Homicidal Thoughts:  No  Memory:  Immediate;   Fair Recent;   Fair Remote;   Fair  Judgement:  Fair  Insight:  Fair  Psychomotor Activity:  Decreased  Concentration:  Concentration: Fair and Attention Span: Fair  Recall:  [employer] of Knowledge:  Fair  Language:  Fair  Akathisia:  No  Handed:  Right  AIMS (if indicated):     Assets:  Desire for Improvement Financial Resources/Insurance Housing Social Support  ADL's:  Intact  Cognition:  WNL  Sleep:  Number of Hours: 5.75     CLINICAL FACTORS:   Epilepsy     COGNITIVE FEATURES THAT CONTRIBUTE TO RISK:  None    SUICIDE RISK:   Mild:  Suicidal ideation of limited frequency, intensity, duration, and specificity.  There are no identifiable plans, no associated intent, mild dysphoria and related symptoms, good self-control (both objective and subjective assessment), few other risk factors, and identifiable protective factors, including available and accessible social support.  PLAN OF CARE: Patient is being discharged to medical facility.   I certify that inpatient services furnished can reasonably be expected to improve the patient's condition.   Larita Fife, MD 09/27/2018, 2:49 PM

## 2018-09-27 NOTE — Discharge Summary (Signed)
Physician Discharge Summary Note  Patient:  Kim [name] is an 53 y.o., female MRN:  [id number] DOB:  08-Apr-1966 Patient phone:  [phone] (home)  Patient address:   [place] [address],  Total Time spent with patient: 30 minutes  Date of Admission:  09/22/2018 Date of Discharge: 09/27/2018  Reason for Admission:  Agitation, paranoia  Principal Problem: MDD (major depressive disorder), recurrent, severe, with psychosis ([place]) Discharge Diagnoses: Principal Problem:   MDD (major depressive disorder), recurrent, severe, with psychosis ([place]) Active Problems:   Marijuana use, episodic   Epilepsy ([place])   Confusion after a seizure   PTSD (post-traumatic stress disorder)   Acute psychosis ([place])   Past Psychiatric History:  Dx: bipolar disorder vs MDD. Had past hospitalizations. Patient denies past suicidal attempts.  Past psych meds: Rexulti. Her recent psych medications were: Prozac 30mg  po daily, Oxcarbazepine 300mg  PO TID, Trazodone QHS.   Past Medical History:  Past Medical History:  Diagnosis Date  . Depression   . Migraines   . Neck pain   . Seizures ([place])     Past Surgical History:  Procedure Laterality Date  . IMPLANTATION VAGAL NERVE STIMULATOR     2001, 2008, 2014   Family History:  Family History  Problem Relation Age of Onset  . Hyperlipidemia Mother   . Hypertension Mother   . Hyperlipidemia Father   . Hypertension Father   . Lung cancer Father   . Diabetes Father   . Heart disease Father    Family Psychiatric  History: Patient denies a family history significant for mental illness, and/or suicide in family members.  Social History: Married; has children and grandchildren. On disability. No legal issues. Patient denies guns at home.   Social History   Substance and Sexual Activity  Alcohol Use Not Currently  . Alcohol/week: 0.0 standard drinks   Comment: No drinking in six weeks.  She was previously drinking daily. Recent completion  of rehab.     Social History   Substance and Sexual Activity  Drug Use Yes  . Frequency: 3.0 times per week  . Types: Marijuana   Comment: Stop smoking marijuana six weeks ago.  Previously smoking daily.  Recent completion of rehab.    Social History   Socioeconomic History  . Marital status: Single    Spouse name: Not on file  . Number of children: 2  . Years of education: GED  . Highest education level: Not on file  Occupational History  . Occupation: Disabled.  Social Needs  . Financial resource strain: Not on file  . Food insecurity:    Worry: Not on file    Inability: Not on file  . Transportation needs:    Medical: Not on file    Non-medical: Not on file  Tobacco Use  . Smoking status: Former Research scientist (life sciences)  . Smokeless tobacco: Never Used  . Tobacco comment: Quit 2001  Substance and Sexual Activity  . Alcohol use: Not Currently    Alcohol/week: 0.0 standard drinks    Comment: No drinking in six weeks.  She was previously drinking daily. Recent completion of rehab.  . Drug use: Yes    Frequency: 3.0 times per week    Types: Marijuana    Comment: Stop smoking marijuana six weeks ago.  Previously smoking daily.  Recent completion of rehab.  . Sexual activity: Not on file  Lifestyle  . Physical activity:    Days per week: Not on file    Minutes per  session: Not on file  . Stress: Not on file  Relationships  . Social connections:    Talks on phone: Patient refused    Gets together: Patient refused    Attends religious service: Patient refused    Active member of club or organization: Patient refused    Attends meetings of clubs or organizations: Patient refused    Relationship status: Patient refused  Other Topics Concern  . Not on file  Social History Narrative   Lives at home with husband.   2-3 cups caffeine daily.   Right-handed.    Hospital Course:   [name] is a 53yo F with a history of major depressive disorder versus bipolar affective disorder,  PTSD,alcohol use disorder, marijuana use disorder borderline personality disorder, and a medical history of seizure disorder, who was admitted to [facility] unit for manic state with acute psychosis threatening self and family. In the unit, patient underwent daily monitoring for safety, she had meetings with treatment team, participated in group activities. She had not been threatening or violent since being in the unit.  Medicines have been adjusted with the resumption of anti-epilepsy medicines as well as psychiatric medicines. Our suspicion was that the patient's condition might would be largely related to her seizures and postictal state.   On assessment today, on 09/27/18, patient found to be somnolent with unsteady gait. She reported good mood and denied any mental complaints. Denie SI/HI. VPA level drawn and found to be toxic at 183. Depakote discontinued. STAT orders for LFT and ammonia level placed. Hospitalist consulted and the patient transferred to medical floor for further management.  On discharge, patient continued on low-dose of Seroquel 50mg  PO QHS for psychosis; this choice is relatively-safe regarding lowering seizure threshold andantiseizure medications:Clobazam 20mg  PO BID; Oxcarbazepine 450mg  PO BID, Topamax 150mg  PO BID.  Physical Findings: AIMS:  , ,  ,  ,    CIWA:  CIWA-Ar Total: 3 COWS:  COWS Total Score: 3  Musculoskeletal: Strength & Muscle Tone: decreased Gait & Station: unsteady Patient leans: N/A  Psychiatric Specialty Exam: Physical Exam   ROS   Blood pressure 137/89, pulse 78, temperature 97.9 F (36.6 C), temperature source Oral, resp. rate 16, height 5\' 6"  (1.676 m), weight 65.8 kg, SpO2 100 %.Body mass index is 23.4 kg/m.  General Appearance: Casual  Eye Contact:  Good  Speech:  Normal Rate  Volume:  Normal  Mood:  Euthymic  Affect:  Appropriate and Restricted  Thought Process:  Coherent, Goal Directed and Linear  Orientation:   Place and person  Thought Content:  Logical  Suicidal Thoughts:  No  Homicidal Thoughts:  No  Memory:  Immediate;   Fair Recent;   Fair Remote;   Fair  Judgement:  Fair  Insight:  Fair  Psychomotor Activity:  Decreased  Concentration:  Concentration: Fair and Attention Span: Fair  Recall:  [employer] of Knowledge:  Fair  Language:  Fair  Akathisia:  No  Handed:  Right  AIMS (if indicated):     Assets:  Desire for Improvement Financial Resources/Insurance Housing Social Support  ADL's:  Intact  Cognition:  WNL  Sleep:  Number of Hours: 5.75         Has this patient used any form of tobacco in the last 30 days? (Cigarettes, Smokeless Tobacco, Cigars, and/or Pipes) Yes, Yes, Prescription not provided because: patient is not interested  Blood Alcohol level:  Lab Results  Component Value Date   ETH 31 (H)  10/09/2016   ETH 114 (H) 82/99/3716    Metabolic Disorder Labs:  No results found for: HGBA1C, MPG No results found for: PROLACTIN Lab Results  Component Value Date   CHOL 200 12/10/2014   TRIG 82 12/10/2014   HDL 98 12/10/2014   CHOLHDL 2.0 12/10/2014   VLDL 16 12/10/2014   LDLCALC 86 12/10/2014    See Psychiatric Specialty Exam and Suicide Risk Assessment completed by Attending Physician prior to discharge.  Discharge destination:  Other:  [facility] medical floor  Is patient on multiple antipsychotic therapies at discharge:  No   Has Patient had three or more failed trials of antipsychotic monotherapy by history:  No  Recommended Plan for Multiple Antipsychotic Therapies: NA   Allergies as of 09/27/2018      Reactions   Ezogabine Other (See Comments)   Unknown to patient   Lacosamide Other (See Comments)   Unknown   Levetiracetam Other (See Comments)   unknown   Other Other (See Comments)   unknown unknown   Perampanel Other (See Comments)   Unknown to patient   Prednisone Other (See Comments)   unknown   Tizanidine Other (See Comments)    unknown      Medication List    STOP taking these medications   cloBAZam 10 MG tablet Commonly known as:  ONFI   cloBAZam 20 MG tablet Commonly known as:  ONFI   diazepam 5 MG tablet Commonly known as:  Valium   divalproex 500 MG 24 hr tablet Commonly known as:  Depakote ER   esomeprazole 40 MG capsule Commonly known as:  NexIUM Replaced by:  pantoprazole 40 MG tablet   FLUoxetine 20 MG capsule Commonly known as:  PROZAC   FLUoxetine 20 MG tablet Commonly known as:  PROZAC   Fremanezumab-vfrm 225 MG/1.5ML Sosy Commonly known as:  Ajovy   hydrOXYzine 50 MG tablet Commonly known as:  ATARAX/VISTARIL   ibuprofen 800 MG tablet Commonly known as:  ADVIL,MOTRIN   ondansetron 4 MG disintegrating tablet Commonly known as:  ZOFRAN-ODT   Rexulti 0.5 MG Tabs Generic drug:  Brexpiprazole   Rexulti 1 MG Tabs Generic drug:  Brexpiprazole   rOPINIRole 1 MG tablet Commonly known as:  REQUIP   rOPINIRole 2 MG tablet Commonly known as:  REQUIP   Topiramate ER 100 MG Cp24 Commonly known as:  Trokendi XR Replaced by:  topiramate 50 MG tablet   traZODone 50 MG tablet Commonly known as:  DESYREL     TAKE these medications     Indication  acetaminophen 325 MG tablet Commonly known as:  TYLENOL Take 2 tablets (650 mg total) by mouth every 6 (six) hours as needed for mild pain.  Indication:  Pain   lisinopril 10 MG tablet Commonly known as:  PRINIVIL,ZESTRIL Take 1 tablet (10 mg total) by mouth daily. Start taking on:  September 28, 2018  Indication:  High Blood Pressure Disorder   OXcarbazepine 150 MG tablet Commonly known as:  TRILEPTAL Take 3 tablets (450 mg total) by mouth 2 (two) times daily for 30 days. What changed:  Another medication with the same name was removed. Continue taking this medication, and follow the directions you see here.  Indication:  Focal Epilepsy   pantoprazole 40 MG tablet Commonly known as:  PROTONIX Take 1 tablet (40 mg total) by  mouth daily. Start taking on:  September 28, 2018 Replaces:  esomeprazole 40 MG capsule  Indication:  Indigestion   QUEtiapine 50 MG tablet Commonly known  as:  SEROQUEL Take 1 tablet (50 mg total) by mouth at bedtime for 30 days.  Indication:  Major Depressive Disorder   SUMAtriptan 100 MG tablet Commonly known as:  IMITREX Take 1 tablet (100 mg total) by mouth once as needed for migraine. May repeat in 2 hours if headache persists or recurs. What changed:  additional instructions  Indication:  Migraine Headache   topiramate 50 MG tablet Commonly known as:  TOPAMAX Take 3 tablets (150 mg total) by mouth 2 (two) times daily for 30 days. Replaces:  Topiramate ER 100 MG Cp24  Indication:  Partial Onset Seizure      Follow-up Information    [facility] Follow up.   Contact information: [place] [address] [phone]           Follow-up recommendations:  F/u with outpatient psychiatrist at [facility].    Signed: Larita Fife, MD 09/27/2018, 2:53 PM

## 2018-09-27 NOTE — Progress Notes (Signed)
D: Patient has been confused and somewhat needy. Up at 3am wanting to brush her teeth. Seemed to be disoriented to time. Denies SI, HI and AV hallucinations. She did say her husband did not want her to come home. She denied that had anything to do with her coming to the hospital.  A: Continue to monitor and offer support. R: Safety maintained.

## 2018-09-27 NOTE — Progress Notes (Signed)
Patient is asleep and did not budge when this writer attempted to wake her for morning medications. This writer will administer morning medication once patient awakes.

## 2018-09-28 ENCOUNTER — Inpatient Hospital Stay: Payer: Medicare Other

## 2018-09-28 LAB — VALPROIC ACID LEVEL: Valproic Acid Lvl: 127 ug/mL — ABNORMAL HIGH (ref 50.0–100.0)

## 2018-09-28 LAB — CBC
HCT: 40.6 % (ref 36.0–46.0)
Hemoglobin: 13.2 g/dL (ref 12.0–15.0)
MCH: 32.7 pg (ref 26.0–34.0)
MCHC: 32.5 g/dL (ref 30.0–36.0)
MCV: 100.5 fL — ABNORMAL HIGH (ref 80.0–100.0)
Platelets: 108 10*3/uL — ABNORMAL LOW (ref 150–400)
RBC: 4.04 MIL/uL (ref 3.87–5.11)
RDW: 14.8 % (ref 11.5–15.5)
WBC: 4.3 10*3/uL (ref 4.0–10.5)
nRBC: 0 % (ref 0.0–0.2)

## 2018-09-28 LAB — COMPREHENSIVE METABOLIC PANEL
ALT: 6 U/L (ref 0–44)
AST: 15 U/L (ref 15–41)
Albumin: 3.1 g/dL — ABNORMAL LOW (ref 3.5–5.0)
Alkaline Phosphatase: 48 U/L (ref 38–126)
Anion gap: 10 (ref 5–15)
BUN: 19 mg/dL (ref 6–20)
CO2: 15 mmol/L — ABNORMAL LOW (ref 22–32)
Calcium: 7.9 mg/dL — ABNORMAL LOW (ref 8.9–10.3)
Chloride: 113 mmol/L — ABNORMAL HIGH (ref 98–111)
Creatinine, Ser: 0.93 mg/dL (ref 0.44–1.00)
GFR calc Af Amer: >60 mL/min (ref >60)
GFR calc non Af Amer: >60 mL/min (ref >60)
Glucose, Bld: 78 mg/dL (ref 70–99)
Potassium: 4.2 mmol/L (ref 3.5–5.1)
Sodium: 138 mmol/L (ref 135–145)
Total Bilirubin: 0.4 mg/dL (ref 0.3–1.2)
Total Protein: 5.5 g/dL — ABNORMAL LOW (ref 6.5–8.1)

## 2018-09-29 ENCOUNTER — Telehealth: Payer: Self-pay | Admitting: Neurology

## 2018-09-29 DIAGNOSIS — R440 Auditory hallucinations: Secondary | ICD-10-CM

## 2018-09-29 DIAGNOSIS — Z8659 Personal history of other mental and behavioral disorders: Secondary | ICD-10-CM

## 2018-09-29 DIAGNOSIS — R441 Visual hallucinations: Secondary | ICD-10-CM

## 2018-09-29 LAB — AMMONIA: Ammonia: 68 umol/L — ABNORMAL HIGH (ref 9–35)

## 2018-09-29 LAB — CBC
HCT: 34.6 % — ABNORMAL LOW (ref 36.0–46.0)
Hemoglobin: 11.3 g/dL — ABNORMAL LOW (ref 12.0–15.0)
MCH: 32.8 pg (ref 26.0–34.0)
MCHC: 32.7 g/dL (ref 30.0–36.0)
MCV: 100.6 fL — ABNORMAL HIGH (ref 80.0–100.0)
Platelets: 87 10*3/uL — ABNORMAL LOW (ref 150–400)
RBC: 3.44 MIL/uL — ABNORMAL LOW (ref 3.87–5.11)
RDW: 14.6 % (ref 11.5–15.5)
WBC: 3.7 10*3/uL — ABNORMAL LOW (ref 4.0–10.5)
nRBC: 0 % (ref 0.0–0.2)

## 2018-09-29 LAB — VALPROIC ACID LEVEL: Valproic Acid Lvl: 84 ug/mL (ref 50.0–100.0)

## 2018-09-29 LAB — PROTIME-INR
INR: 1.1 (ref 0.8–1.2)
Prothrombin Time: 13.9 seconds (ref 11.4–15.2)

## 2018-09-29 LAB — HIV ANTIBODY (ROUTINE TESTING W REFLEX): HIV Screen 4th Generation wRfx: NONREACTIVE

## 2018-09-29 LAB — APTT: aPTT: 36 seconds (ref 24–36)

## 2018-09-29 MED ORDER — LACTULOSE 10 GM/15ML PO SOLN
30.0000 g | Freq: Two times a day (BID) | ORAL | Status: DC
  Administered 2018-09-29 – 2018-10-01 (×5): 30 g via ORAL
  Filled 2018-09-29 (×5): qty 60

## 2018-09-29 MED ORDER — DIVALPROEX SODIUM ER 500 MG PO TB24
1000.0000 mg | ORAL_TABLET | Freq: Every day | ORAL | Status: DC
  Administered 2018-09-29 – 2018-10-02 (×4): 1000 mg via ORAL
  Filled 2018-09-29 (×4): qty 2

## 2018-09-29 MED ORDER — QUETIAPINE FUMARATE 25 MG PO TABS: 50.0000 mg | ORAL_TABLET | Freq: Every evening | ORAL | Status: DC | PRN

## 2018-09-29 NOTE — Progress Notes (Signed)
[facility] [place] NAME: Kim [name]    MR#:  [id number]  DATE OF BIRTH:  Feb 25, 1966  SUBJECTIVE:  CHIEF COMPLAINT:  No chief complaint on file.  Transferred to medical service for confusion. Depakot level was high. More alert today, talking some but still confused about events. No focal weakness.  REVIEW OF SYSTEMS:   Due to confusion, pt is not able to give ROS  ROS  DRUG ALLERGIES:   Allergies  Allergen Reactions  . Ezogabine Other (See Comments)    Unknown to patient  . Lacosamide Other (See Comments)    Unknown  . Levetiracetam Other (See Comments)    unknown  . Other Other (See Comments)    unknown unknown  . Perampanel Other (See Comments)    Unknown to patient  . Prednisone Other (See Comments)    unknown  . Tizanidine Other (See Comments)    unknown    VITALS:  Blood pressure 126/60, pulse 68, temperature 97.6 F (36.4 C), temperature source Oral, resp. rate 16, SpO2 100 %.  PHYSICAL EXAMINATION:   GENERAL:  53 y.o.-year-old patientsitting in the bed with no acute distress.  EYES: Pupils equal, round, reactive to light and accommodation. No scleral icterus. Extraocular muscles intact.  HEENT: Head atraumatic, normocephalic. Oropharynx and nasopharynx clear. No oropharyngeal erythema, moist oral mucosa  NECK:  Supple, no jugular venous distention. No thyroid enlargement, no tenderness.  LUNGS: Normal breath sounds bilaterally, no wheezing, rales, rhonchi. No use of accessory muscles of respiration.  CARDIOVASCULAR: S1, S2 normal. No murmurs, rubs, or gallops.  ABDOMEN: Soft, nontender, nondistended. Bowel sounds present. No organomegaly or mass.  EXTREMITIES: No pedal edema, cyanosis, or clubbing. + 2 pedal & radial pulses b/l.   NEUROLOGIC: Following instructions but moves all 4 extremities, alert and confused. PSYCHIATRIC: The patient is confused. SKIN: No obvious rash, lesion, or ulcer.   Physical  Exam LABORATORY PANEL:   CBC Recent Labs  Lab 09/29/18 0346  WBC 3.7*  HGB 11.3*  HCT 34.6*  PLT 87*   ------------------------------------------------------------------------------------------------------------------  Chemistries  Recent Labs  Lab 09/28/18 0423  NA 138  K 4.2  CL 113*  CO2 15*  GLUCOSE 78  BUN 19  CREATININE 0.93  CALCIUM 7.9*  AST 15  ALT 6  ALKPHOS 48  BILITOT 0.4   ------------------------------------------------------------------------------------------------------------------  Cardiac Enzymes No results for input(s): TROPONINI in the last 168 hours. ------------------------------------------------------------------------------------------------------------------  RADIOLOGY:  Ct Head Wo Contrast  Result Date: 09/28/2018 CLINICAL DATA:  53 y/o F; ataxia, confusion, drowsiness. History of seizures, migraines, depression. EXAM: CT HEAD WITHOUT CONTRAST TECHNIQUE: Contiguous axial images were obtained from the base of the skull through the vertex without intravenous contrast. COMPARISON:  None. FINDINGS: Brain: No evidence of acute infarction, hemorrhage, hydrocephalus, extra-axial collection or mass lesion/mass effect. Vascular: No hyperdense vessel or unexpected calcification. Skull: Normal. Negative for fracture or focal lesion. Sinuses/Orbits: No acute finding. Other: None. IMPRESSION: No acute intracranial abnormality identified. Unremarkable CT of the head. Electronically Signed   By: Kristine Garbe M.D.   On: 09/28/2018 19:04    ASSESSMENT AND PLAN:   Active Problems:   Valproic acid toxicity  *Valproic acid toxicity. Altered mental status   Levels are at 183> 127.  Patient will be moved to medical unit with telemetry monitoring.  Started IV fluids.  Hold Depakote.  Liver function tests are stable.  Mild elevation in ammonia is due to chronic use of Depakote.   restart  Depakote at lower dose of 1000 milligrams daily as levels are  therapeutic( < 100), discussed on phone with oncall neurologist. Repeat LFTs in the morning are within normal limit.  Checked blood glucose level- normal.  Ammonia is elevated- start lactulose. Neurochecks Fall precautions Seizure precaution  no abnormalities on CT head.  *Depression.  Management per psychiatry team.  *  DVT prophylaxis with Lovenox    All the records are reviewed and case discussed with Care Management/Social Workerr. Management plans discussed with the patient, family and they are in agreement.  CODE STATUS: Full.  TOTAL TIME TAKING CARE OF THIS PATIENT: 35 minutes.     POSSIBLE D/C IN 2-3 DAYS, DEPENDING ON CLINICAL CONDITION.   [name] M.D on 09/29/2018   Between 7am to 6pm - Pager - [phone]  After 6pm go to [email or web address] - password [email or web address] [facility]  Office  [phone]  CC: Primary care physician; Patient, No Pcp Per  Note: This dictation was prepared with Dragon dictation along with smaller phrase technology. Any transcriptional errors that result from this process are unintentional.

## 2018-09-29 NOTE — Consult Note (Signed)
[facility] Face-to-Face Psychiatry Consult   Reason for Consult: History of bipolar disorder, history of PTSD, confusion, orientation Referring Physician:  Anselm Jungling Patient Identification: Kim [name] MRN:  [id number] Principal Diagnosis: <principal problem not specified> Diagnosis:  Active Problems:   Valproic acid toxicity   Total Time spent with patient: 15 minutes  Subjective:   Kim [name] is a 53 y.o. female patient admitted with history of bipolar disorder recent Depakote toxicity.Marland Kitchen  HPI: Patient is seen and examined.  Patient is a 53 year old female with a past psychiatric history significant for depression, PTSD, bipolar disorder who originally presented to the [facility] emergency department on/7/20.  She was apparently manic at that time and threatening staff, self and family.  She was disorganized and having visual and auditory hallucinations.  She was admitted to the hospital for evaluation and stabilization.  Collateral information from her husband revealed that she had been more confused and having hallucinations recently.  She had had occasional seizures and was postictal for a day or 2 afterwards.  In the hospital she was noted to be paranoid about family members, yelling at staff and agitation.  She threatened to kill visual hallucinations.  Her psychiatric medications at that time included fluoxetine, I left all, and trazodone.  On 09/24/2018 her valproic acid level was 132, and then on 4/12 was 183.  She was transferred to the medical service after that.  On 09/28/2018 her level dropped to 127, and today is at 84.  Her current medications currently include Depakote 2000 mg p.o. daily, Lovenox, lactulose, Trileptal 450 mg p.o. twice daily, Protonix 40 mg p.o. daily, Seroquel 50 mg p.o. nightly, Topamax 150 mg p.o. twice daily.  On examination today she is sleepy, lethargic, still confused.  Past Psychiatric History: Patient carries a differential diagnosis of  bipolar disorder versus major depression.  She has had multiple psychiatric admissions in the past.  Previous psychiatric medications include Rexulti, fluoxetine, Trileptal, trazodone.  Risk to Self:   Risk to Others:   Prior Inpatient Therapy:   Prior Outpatient Therapy:    Past Medical History:  Past Medical History:  Diagnosis Date  . Depression   . Migraines   . Neck pain   . Seizures ([facility])     Past Surgical History:  Procedure Laterality Date  . IMPLANTATION VAGAL NERVE STIMULATOR     2001, 2008, 2014   Family History:  Family History  Problem Relation Age of Onset  . Hyperlipidemia Mother   . Hypertension Mother   . Hyperlipidemia Father   . Hypertension Father   . Lung cancer Father   . Diabetes Father   . Heart disease Father    Family Psychiatric  History: Noncontributory Social History:  Social History   Substance and Sexual Activity  Alcohol Use Not Currently  . Alcohol/week: 0.0 standard drinks   Comment: No drinking in six weeks.  She was previously drinking daily. Recent completion of rehab.     Social History   Substance and Sexual Activity  Drug Use Yes  . Frequency: 3.0 times per week  . Types: Marijuana   Comment: Stop smoking marijuana six weeks ago.  Previously smoking daily.  Recent completion of rehab.    Social History   Socioeconomic History  . Marital status: Single    Spouse name: Not on file  . Number of children: 2  . Years of education: GED  . Highest education level: Not on file  Occupational History  . Occupation: Disabled.  Social  Needs  . Financial resource strain: Not on file  . Food insecurity:    Worry: Not on file    Inability: Not on file  . Transportation needs:    Medical: Not on file    Non-medical: Not on file  Tobacco Use  . Smoking status: Former Research scientist (life sciences)  . Smokeless tobacco: Never Used  . Tobacco comment: Quit 2001  Substance and Sexual Activity  . Alcohol use: Not Currently    Alcohol/week: 0.0  standard drinks    Comment: No drinking in six weeks.  She was previously drinking daily. Recent completion of rehab.  . Drug use: Yes    Frequency: 3.0 times per week    Types: Marijuana    Comment: Stop smoking marijuana six weeks ago.  Previously smoking daily.  Recent completion of rehab.  . Sexual activity: Not on file  Lifestyle  . Physical activity:    Days per week: Not on file    Minutes per session: Not on file  . Stress: Not on file  Relationships  . Social connections:    Talks on phone: Patient refused    Gets together: Patient refused    Attends religious service: Patient refused    Active member of club or organization: Patient refused    Attends meetings of clubs or organizations: Patient refused    Relationship status: Patient refused  Other Topics Concern  . Not on file  Social History Narrative   Lives at home with husband.   2-3 cups caffeine daily.   Right-handed.   Additional Social History:    Allergies:   Allergies  Allergen Reactions  . Ezogabine Other (See Comments)    Unknown to patient  . Lacosamide Other (See Comments)    Unknown  . Levetiracetam Other (See Comments)    unknown  . Other Other (See Comments)    unknown unknown  . Perampanel Other (See Comments)    Unknown to patient  . Prednisone Other (See Comments)    unknown  . Tizanidine Other (See Comments)    unknown    Labs:  Results for orders placed or performed during the hospital encounter of 09/27/18 (from the past 48 hour(s))  Glucose, capillary     Status: Abnormal   Collection Time: 09/27/18  5:38 PM  Result Value Ref Range   Glucose-Capillary 62 (L) 70 - 99 mg/dL   Comment 1 Notify RN    Comment 2 Document in Chart   HIV antibody (Routine Testing)     Status: None   Collection Time: 09/27/18  5:43 PM  Result Value Ref Range   HIV Screen 4th Generation wRfx Non Reactive Non Reactive    Comment: (NOTE) Performed At: [facility] [place], [place] [id number] Rush Farmer MD [id number]   Basic metabolic panel     Status: Abnormal   Collection Time: 09/27/18  5:43 PM  Result Value Ref Range   Sodium 138 135 - 145 mmol/L   Potassium 3.1 (L) 3.5 - 5.1 mmol/L   Chloride 106 98 - 111 mmol/L   CO2 21 (L) 22 - 32 mmol/L   Glucose, Bld 92 70 - 99 mg/dL   BUN 23 (H) 6 - 20 mg/dL   Creatinine, Ser 1.20 (H) 0.44 - 1.00 mg/dL   Calcium 8.7 (L) 8.9 - 10.3 mg/dL   GFR calc non Af Amer 52 (L) >60 mL/min   GFR calc Af Amer 60 (L) >60 mL/min   Anion gap 11  5 - 15    Comment: Performed at [facility], [address] [place], [place] [address]  CBC     Status: Abnormal   Collection Time: 09/27/18  5:43 PM  Result Value Ref Range   WBC 3.5 (L) 4.0 - 10.5 K/uL   RBC 4.46 3.87 - 5.11 MIL/uL   Hemoglobin 14.3 12.0 - 15.0 g/dL   HCT 43.1 36.0 - 46.0 %   MCV 96.6 80.0 - 100.0 fL   MCH 32.1 26.0 - 34.0 pg   MCHC 33.2 30.0 - 36.0 g/dL   RDW 14.6 11.5 - 15.5 %   Platelets 127 (L) 150 - 400 K/uL    Comment: Immature Platelet Fraction may be clinically indicated, consider ordering this additional test [id number]    nRBC 0.0 0.0 - 0.2 %    Comment: Performed at [facility], [address] [place], [place] [address]  Comprehensive metabolic panel     Status: Abnormal   Collection Time: 09/28/18  4:23 AM  Result Value Ref Range   Sodium 138 135 - 145 mmol/L   Potassium 4.2 3.5 - 5.1 mmol/L    Comment: HEMOLYSIS AT THIS LEVEL MAY AFFECT RESULT   Chloride 113 (H) 98 - 111 mmol/L   CO2 15 (L) 22 - 32 mmol/L   Glucose, Bld 78 70 - 99 mg/dL   BUN 19 6 - 20 mg/dL   Creatinine, Ser 0.93 0.44 - 1.00 mg/dL   Calcium 7.9 (L) 8.9 - 10.3 mg/dL   Total Protein 5.5 (L) 6.5 - 8.1 g/dL   Albumin 3.1 (L) 3.5 - 5.0 g/dL   AST 15 15 - 41 U/L   ALT 6 0 - 44 U/L   Alkaline Phosphatase 48 38 - 126 U/L   Total Bilirubin 0.4 0.3 - 1.2 mg/dL   GFR calc non Af Amer >60 >60 mL/min   GFR calc Af Amer >60 >60 mL/min   Anion  gap 10 5 - 15    Comment: Performed at [facility], [address] [place], [place] [address]  CBC     Status: Abnormal   Collection Time: 09/28/18  4:23 AM  Result Value Ref Range   WBC 4.3 4.0 - 10.5 K/uL   RBC 4.04 3.87 - 5.11 MIL/uL   Hemoglobin 13.2 12.0 - 15.0 g/dL   HCT 40.6 36.0 - 46.0 %   MCV 100.5 (H) 80.0 - 100.0 fL   MCH 32.7 26.0 - 34.0 pg   MCHC 32.5 30.0 - 36.0 g/dL   RDW 14.8 11.5 - 15.5 %   Platelets 108 (L) 150 - 400 K/uL    Comment: Immature Platelet Fraction may be clinically indicated, consider ordering this additional test [id number]    nRBC 0.0 0.0 - 0.2 %    Comment: Performed at [facility], [address] [place], [place] [address]  Valproic acid level     Status: Abnormal   Collection Time: 09/28/18  4:23 AM  Result Value Ref Range   Valproic Acid Lvl 127 (H) 50.0 - 100.0 ug/mL    Comment: Performed at [facility], [address] [place], [place] [address]  Valproic acid level     Status: None   Collection Time: 09/29/18  3:46 AM  Result Value Ref Range   Valproic Acid Lvl 84 50.0 - 100.0 ug/mL    Comment: Performed at [facility], [address], [place], [place] [address]  CBC     Status: Abnormal  Collection Time: 09/29/18  3:46 AM  Result Value Ref Range   WBC 3.7 (L) 4.0 - 10.5 K/uL   RBC 3.44 (L) 3.87 - 5.11 MIL/uL   Hemoglobin 11.3 (L) 12.0 - 15.0 g/dL   HCT 34.6 (L) 36.0 - 46.0 %   MCV 100.6 (H) 80.0 - 100.0 fL   MCH 32.8 26.0 - 34.0 pg   MCHC 32.7 30.0 - 36.0 g/dL   RDW 14.6 11.5 - 15.5 %   Platelets 87 (L) 150 - 400 K/uL    Comment: Immature Platelet Fraction may be clinically indicated, consider ordering this additional test [id number] CONSISTENT WITH PREVIOUS RESULT    nRBC 0.0 0.0 - 0.2 %    Comment: Performed at [facility], [address] [place], [place] [address]  Ammonia     Status: Abnormal   Collection Time: 09/29/18  3:46 AM  Result Value Ref Range   Ammonia 68  (H) 9 - 35 umol/L    Comment: Performed at [facility], [address] [place], [place] [address]    Current Facility-Administered Medications  Medication Dose Route Frequency Provider Last Rate Last Dose  . 0.9 %  sodium chloride infusion   Intravenous Continuous Hillary Bow, MD 100 mL/hr at 09/29/18 1100    . acetaminophen (TYLENOL) tablet 650 mg  650 mg Oral Q6H PRN Hillary Bow, MD       Or  . acetaminophen (TYLENOL) suppository 650 mg  650 mg Rectal Q6H PRN Sudini, Srikar, MD      . albuterol (PROVENTIL) (2.5 MG/3ML) 0.083% nebulizer solution 2.5 mg  2.5 mg Nebulization Q2H PRN Sudini, Alveta Heimlich, MD      . divalproex (DEPAKOTE ER) 24 hr tablet 1,000 mg  1,000 mg Oral Daily [name], MD   1,000 mg at 09/29/18 0946  . enoxaparin (LOVENOX) injection 40 mg  40 mg Subcutaneous Q24H Hillary Bow, MD   40 mg at 09/27/18 1858  . lactulose (CHRONULAC) 10 GM/15ML solution 30 g  30 g Oral BID [name], MD   30 g at 09/29/18 0947  . ondansetron (ZOFRAN) tablet 4 mg  4 mg Oral Q6H PRN Hillary Bow, MD       Or  . ondansetron (ZOFRAN) injection 4 mg  4 mg Intravenous Q6H PRN Sudini, Alveta Heimlich, MD      . OXcarbazepine (TRILEPTAL) tablet 450 mg  450 mg Oral BID Hillary Bow, MD   450 mg at 09/29/18 0945  . pantoprazole (PROTONIX) EC tablet 40 mg  40 mg Oral Daily Hillary Bow, MD   40 mg at 09/29/18 0945  . polyethylene glycol (MIRALAX / GLYCOLAX) packet 17 g  17 g Oral Daily PRN Sudini, Alveta Heimlich, MD      . QUEtiapine (SEROQUEL) tablet 50 mg  50 mg Oral QHS Hillary Bow, MD   50 mg at 09/28/18 2321  . sodium chloride flush (NS) 0.9 % injection 3 mL  3 mL Intravenous Q12H Sudini, Srikar, MD   3 mL at 09/29/18 1000  . topiramate (TOPAMAX) tablet 150 mg  150 mg Oral BID Hillary Bow, MD   150 mg at 09/29/18 0347    Musculoskeletal: Strength & Muscle Tone: decreased Gait & Station: Lying in a hospital bed Patient leans: N/A  Psychiatric Specialty  Exam: Physical Exam  Nursing note and vitals reviewed. Constitutional: She appears well-developed and well-nourished.  HENT:  Head: Normocephalic and atraumatic.  Respiratory: Effort normal.  Neurological: She is alert.    ROS  Blood  pressure (!) 115/52, pulse 66, temperature 97.6 F (36.4 C), temperature source Oral, resp. rate 18, SpO2 99 %.There is no height or weight on file to calculate BMI.  General Appearance: Disheveled  Eye Contact:  Minimal  Speech:  Slow  Volume:  Decreased  Mood:  Lethargic  Affect:  Blunt  Thought Process:  Disorganized and Descriptions of Associations: Circumstantial  Orientation:  Other:  Oriented to self and location.  Thought Content:  NA  Suicidal Thoughts:  No  Homicidal Thoughts:  No  Memory:  Immediate;   Poor Recent;   Poor Remote;   Poor  Judgement:  Impaired  Insight:  Fair  Psychomotor Activity:  Decreased and Psychomotor Retardation  Concentration:  Concentration: Poor and Attention Span: Poor  Recall:  Poor  Fund of Knowledge:  Poor  Language:  Poor  Akathisia:  Negative  Handed:  Right  AIMS (if indicated):     Assets:  Desire for Improvement Resilience  ADL's:  Impaired  Cognition:  Impaired,  Severe  Sleep:        Treatment Plan Summary: Daily contact with patient to assess and evaluate symptoms and progress in treatment, Medication management and Plan : Patient is seen and examined.  Patient is a 53 year old female with the above-stated past psychiatric history who is seen on the consult service.  She was transferred from the inpatient psychiatric service on 4/14.  Her Depakote level was significantly elevated at 183.  Her levels have come down since admission.  She is at 58 currently, but she continues to be lethargic and altered.  I am unsure what her baseline is, so it is difficult to say.  She does still appear to be various and altered.  She is clearly been noncompliant with her medications in the past.  Her Trileptal  level on valuation on 4/1 was low at 11, her topiramate level was 7, which again is low.  Her TSH is normal.  She is mildly anemic.  Her drug screen on 4/1 was essentially negative.  Her ammonia is elevated at 68.  This is elevated from an ammonia that was a change 2 days ago which was at 45.  She is mildly anemic, and her MCV is elevated at 100.6.  Her platelets are low at 87, which may be consistent with the Depakote as well as other anticonvulsants.  Her liver function enzymes are normal.  I think minimizing her medications at this time is probably the better part of valor.  Given her seizure disorder that change may be quite limited.  I am going to change his Seroquel to 50 mg p.o. nightly as needed to at least limit things, but clearly with her ammonia elevated certainly looks like she has [facility] encephalopathy/delirium.  I would rather not add any new medications if at all possible until her mentation changes.  If the Depakote is purely for bipolar disorder reducing that may be helpful.  I will continue to follow with you.  Her albumin has dropped to 3.1 which might suggest a decrease in synthetic function in the liver.  Her CT of the head was essentially normal.  I will order PT/PTT to assess her synthetic function given her leukopenia and anemia as well as thrombocytopenia.  Disposition: Continue with the consultation service.  Sharma Covert, MD 09/29/2018 12:08 PM

## 2018-09-29 NOTE — Telephone Encounter (Signed)
I reviewed discharge summary on September 27 2018, she was admitted for manic state with acute psychosis,

## 2018-09-29 NOTE — Progress Notes (Signed)
Kim [name] at [place] NAME: Kim [name]    MR#:  [id number]  DATE OF BIRTH:  26-May-1966  SUBJECTIVE:  CHIEF COMPLAINT:  No chief complaint on file.  Transferred to medical service for confusion. Depakot level was high. Remains confused. No focal weakness.  REVIEW OF SYSTEMS:   Due to confusion, pt is not able to give ROS  ROS  DRUG ALLERGIES:   Allergies  Allergen Reactions  . Ezogabine Other (See Comments)    Unknown to patient  . Lacosamide Other (See Comments)    Unknown  . Levetiracetam Other (See Comments)    unknown  . Other Other (See Comments)    unknown unknown  . Perampanel Other (See Comments)    Unknown to patient  . Prednisone Other (See Comments)    unknown  . Tizanidine Other (See Comments)    unknown    VITALS:  Blood pressure (!) 115/52, pulse 66, temperature 97.6 F (36.4 C), temperature source Oral, resp. rate 18, SpO2 99 %.  PHYSICAL EXAMINATION:   GENERAL:  53 y.o.-year-old patientsitting in the bed with no acute distress.  EYES: Pupils equal, round, reactive to light and accommodation. No scleral icterus. Extraocular muscles intact.  HEENT: Head atraumatic, normocephalic. Oropharynx and nasopharynx clear. No oropharyngeal erythema, moist oral mucosa  NECK:  Supple, no jugular venous distention. No thyroid enlargement, no tenderness.  LUNGS: Normal breath sounds bilaterally, no wheezing, rales, rhonchi. No use of accessory muscles of respiration.  CARDIOVASCULAR: S1, S2 normal. No murmurs, rubs, or gallops.  ABDOMEN: Soft, nontender, nondistended. Bowel sounds present. No organomegaly or mass.  EXTREMITIES: No pedal edema, cyanosis, or clubbing. + 2 pedal & radial pulses b/l.   NEUROLOGIC: Following instructions but moves all 4 extremities, alert and confused. PSYCHIATRIC: The patient is confused. SKIN: No obvious rash, lesion, or ulcer.   Physical Exam LABORATORY PANEL:   CBC Recent Labs  Lab  09/29/18 0346  WBC 3.7*  HGB 11.3*  HCT 34.6*  PLT 87*   ------------------------------------------------------------------------------------------------------------------  Chemistries  Recent Labs  Lab 09/28/18 0423  NA 138  K 4.2  CL 113*  CO2 15*  GLUCOSE 78  BUN 19  CREATININE 0.93  CALCIUM 7.9*  AST 15  ALT 6  ALKPHOS 48  BILITOT 0.4   ------------------------------------------------------------------------------------------------------------------  Cardiac Enzymes No results for input(s): TROPONINI in the last 168 hours. ------------------------------------------------------------------------------------------------------------------  RADIOLOGY:  Ct Head Wo Contrast  Result Date: 09/28/2018 CLINICAL DATA:  53 y/o F; ataxia, confusion, drowsiness. History of seizures, migraines, depression. EXAM: CT HEAD WITHOUT CONTRAST TECHNIQUE: Contiguous axial images were obtained from the base of the skull through the vertex without intravenous contrast. COMPARISON:  None. FINDINGS: Brain: No evidence of acute infarction, hemorrhage, hydrocephalus, extra-axial collection or mass lesion/mass effect. Vascular: No hyperdense vessel or unexpected calcification. Skull: Normal. Negative for fracture or focal lesion. Sinuses/Orbits: No acute finding. Other: None. IMPRESSION: No acute intracranial abnormality identified. Unremarkable CT of the head. Electronically Signed   By: Kristine Garbe M.D.   On: 09/28/2018 19:04    ASSESSMENT AND PLAN:   Active Problems:   Valproic acid toxicity  *Valproic acid toxicity.  Levels are at 183> 127.  Patient will be moved to medical unit with telemetry monitoring.  Started IV fluids.  Hold Depakote.  Liver function tests are stable.  Mild elevation in ammonia is due to chronic use of Depakote. Can restart Depakote at lower dose of tho1000 milligrams daily once levels are therapeutic( <  100), discussed on phone with oncall  neurologist. Repeat LFTs in the morning are within normal limit.  Checked blood glucose level- normal.  Neurochecks Fall precautions Seizure precaution  will get CT head.  *Depression.  Management per psychiatry team.  *  DVT prophylaxis with Lovenox    All the records are reviewed and case discussed with Care Management/Social Workerr. Management plans discussed with the patient, family and they are in agreement.  CODE STATUS: Full.  TOTAL TIME TAKING CARE OF THIS PATIENT: 35 minutes.     POSSIBLE D/C IN 2-3 DAYS, DEPENDING ON CLINICAL CONDITION.   [name] M.D on 09/29/2018   Between 7am to 6pm - Pager - [phone]  After 6pm go to [email or web address] - password [email or web address] [facility]  Office  [phone]  CC: Primary care physician; Patient, No Pcp Per  Note: This dictation was prepared with Dragon dictation along with smaller phrase technology. Any transcriptional errors that result from this process are unintentional.

## 2018-09-30 LAB — CBC
HCT: 33.2 % — ABNORMAL LOW (ref 36.0–46.0)
Hemoglobin: 10.9 g/dL — ABNORMAL LOW (ref 12.0–15.0)
MCH: 32.9 pg (ref 26.0–34.0)
MCHC: 32.8 g/dL (ref 30.0–36.0)
MCV: 100.3 fL — ABNORMAL HIGH (ref 80.0–100.0)
Platelets: 78 10*3/uL — ABNORMAL LOW (ref 150–400)
RBC: 3.31 MIL/uL — ABNORMAL LOW (ref 3.87–5.11)
RDW: 14.7 % (ref 11.5–15.5)
WBC: 3.8 10*3/uL — ABNORMAL LOW (ref 4.0–10.5)
nRBC: 0 % (ref 0.0–0.2)

## 2018-09-30 LAB — AMMONIA: Ammonia: 59 umol/L — ABNORMAL HIGH (ref 9–35)

## 2018-09-30 LAB — COMPREHENSIVE METABOLIC PANEL
ALT: 10 U/L (ref 0–44)
AST: 16 U/L (ref 15–41)
Albumin: 2.9 g/dL — ABNORMAL LOW (ref 3.5–5.0)
Alkaline Phosphatase: 46 U/L (ref 38–126)
Anion gap: 8 (ref 5–15)
BUN: 9 mg/dL (ref 6–20)
CO2: 18 mmol/L — ABNORMAL LOW (ref 22–32)
Calcium: 7.9 mg/dL — ABNORMAL LOW (ref 8.9–10.3)
Chloride: 112 mmol/L — ABNORMAL HIGH (ref 98–111)
Creatinine, Ser: 0.8 mg/dL (ref 0.44–1.00)
GFR calc Af Amer: >60 mL/min (ref >60)
GFR calc non Af Amer: >60 mL/min (ref >60)
Glucose, Bld: 73 mg/dL (ref 70–99)
Potassium: 3.5 mmol/L (ref 3.5–5.1)
Sodium: 138 mmol/L (ref 135–145)
Total Bilirubin: 0.5 mg/dL (ref 0.3–1.2)
Total Protein: 4.9 g/dL — ABNORMAL LOW (ref 6.5–8.1)

## 2018-09-30 LAB — VITAMIN B12: Vitamin B-12: 143 pg/mL — ABNORMAL LOW (ref 180–914)

## 2018-09-30 LAB — FOLATE: Folate: 4.6 ng/mL — ABNORMAL LOW (ref >5.9)

## 2018-09-30 NOTE — Progress Notes (Signed)
   09/30/18 1500  Clinical Encounter Type  Visited With Patient not available  Visit Type Initial  Referral From Chaplain  Consult/Referral To Chaplain  Chaplain was rounding by phone calls, however, patient was not available.

## 2018-09-30 NOTE — Consult Note (Signed)
  Inpatient psychiatric consultation follow-up. 09/30/2018  Patient: Kim [name] MRN: [id number]  Principal diagnosis: #1 valproic acid toxicity, #2 hepatic encephalopathy versus delirium, #3 history of bipolar disorder , #4 history of seizure disorder  Current medications: Depakote ER thousand milligrams p.o. daily Lovenox 40 mg subcu every 24 hours Zofran 4 mg every 6 hours as needed nausea. Trileptal 450 mg p.o. twice daily Protonix 40 mg p.o. daily MiraLAX 17 g in 8 ounces of water daily as needed for constipation Seroquel 50 mg p.o. nightly as needed insomnia. Topamax 150 mg p.o. twice daily   Vital signs: Temperature is 97.6, pulse is 67, respirations are 15, blood pressure is 123/56 and her oxygen saturations 100% on room air.  Sleep was not recorded.  Laboratories from 5/62: Metabolic panel essentially normal.  Liver function enzymes remain negative with an AST of 16 and an ALT of 10.  Her ammonia has decreased to 59.  It was 68 on previous.  Her white count is still low at 3.8, mildly anemic with a hemoglobin and hematocrit of 10.9 and 33.2.  Her MCV is still elevated at 100.3.  Platelets are 78,000.  Her platelets continue to drop this is lower than previous.  Her PT was normal at 13.9, INR was 1.1.  Her PTT is slightly out at 36.  Objective: Patient is seen and examined.  Patient is a 53 year old female with the above-stated past psychiatric history was seen in follow-up.  She is more awake and alert today than yesterday.  Yesterday she was halfway out of the bed and halfway in the bed, confused and disoriented.  She was very lethargic.  She appears to be more awake and alert today.  She is alert and oriented x3.  Still lethargic, but clearly she is having bowel movements which is probably the result of the MiraLAX and trying to bring her ammonia down.  She denied any suicidal or homicidal ideation, she denied any auditory or visual hallucinations.  She is quite slow, and still  confused.  Examination: She is alert and oriented x3.  Polite, pleasant but still lethargic.  Fair eye contact.  Psychomotor depression.  She denied any helplessness, hopelessness or worthlessness.  She denied any suicidal ideation.  No racing thoughts, no pressured speech.  No auditory or visual hallucinations.  No evidence of psychomotor agitation.  No euphoria.  Cognitively she still is altered.  Problems and plan: Protested toxicity, delirium, history of bipolar disorder Patient is a 53 year old female with the above-stated past medical and psychiatric history was seen in follow-up.  She is more awake and alert today, but still lethargic.  He is down, her liver function enzymes remain at their same level.  She is still anemic, thrombocytopenic, and leukopenic.  Likely her coagulation factors appear to be normal.  No change in current psychiatric recommendations at this point.  Hopefully she will continue to improve from a medical and psychiatric standpoint.

## 2018-09-30 NOTE — Progress Notes (Signed)
[facility] at [place] NAME: Kim [name]    MR#:  [id number]  DATE OF BIRTH:  November 20, 1965  SUBJECTIVE:  CHIEF COMPLAINT:  No chief complaint on file.  Transferred to medical service for confusion. Depakot level was high. More alert today, talking some but still confused about events. No focal weakness.  REVIEW OF SYSTEMS:   Due to confusion, pt is not able to give ROS  ROS  DRUG ALLERGIES:   Allergies  Allergen Reactions  . Ezogabine Other (See Comments)    Unknown to patient  . Lacosamide Other (See Comments)    Unknown  . Levetiracetam Other (See Comments)    unknown  . Other Other (See Comments)    unknown unknown  . Perampanel Other (See Comments)    Unknown to patient  . Prednisone Other (See Comments)    unknown  . Tizanidine Other (See Comments)    unknown    VITALS:  Blood pressure (!) 145/84, pulse 72, temperature (!) 97.4 F (36.3 C), temperature source Oral, resp. rate 18, height 5\' 6"  (1.676 m), weight 69.1 kg, SpO2 100 %.  PHYSICAL EXAMINATION:   GENERAL:  53 y.o.-year-old patientsitting in the bed with no acute distress.  EYES: Pupils equal, round, reactive to light and accommodation. No scleral icterus. Extraocular muscles intact.  HEENT: Head atraumatic, normocephalic. Oropharynx and nasopharynx clear. No oropharyngeal erythema, moist oral mucosa  NECK:  Supple, no jugular venous distention. No thyroid enlargement, no tenderness.  LUNGS: Normal breath sounds bilaterally, no wheezing, rales, rhonchi. No use of accessory muscles of respiration.  CARDIOVASCULAR: S1, S2 normal. No murmurs, rubs, or gallops.  ABDOMEN: Soft, nontender, nondistended. Bowel sounds present. No organomegaly or mass.  EXTREMITIES: No pedal edema, cyanosis, or clubbing. + 2 pedal & radial pulses b/l.   NEUROLOGIC: Following instructions but moves all 4 extremities, alert and confused. PSYCHIATRIC: The patient is confused. SKIN: No  obvious rash, lesion, or ulcer.   Physical Exam LABORATORY PANEL:   CBC Recent Labs  Lab 09/30/18 0419  WBC 3.8*  HGB 10.9*  HCT 33.2*  PLT 78*   ------------------------------------------------------------------------------------------------------------------  Chemistries  Recent Labs  Lab 09/30/18 0415  NA 138  K 3.5  CL 112*  CO2 18*  GLUCOSE 73  BUN 9  CREATININE 0.80  CALCIUM 7.9*  AST 16  ALT 10  ALKPHOS 46  BILITOT 0.5   ------------------------------------------------------------------------------------------------------------------  Cardiac Enzymes No results for input(s): TROPONINI in the last 168 hours. ------------------------------------------------------------------------------------------------------------------  RADIOLOGY:  Ct Head Wo Contrast  Result Date: 09/28/2018 CLINICAL DATA:  53 y/o F; ataxia, confusion, drowsiness. History of seizures, migraines, depression. EXAM: CT HEAD WITHOUT CONTRAST TECHNIQUE: Contiguous axial images were obtained from the base of the skull through the vertex without intravenous contrast. COMPARISON:  None. FINDINGS: Brain: No evidence of acute infarction, hemorrhage, hydrocephalus, extra-axial collection or mass lesion/mass effect. Vascular: No hyperdense vessel or unexpected calcification. Skull: Normal. Negative for fracture or focal lesion. Sinuses/Orbits: No acute finding. Other: None. IMPRESSION: No acute intracranial abnormality identified. Unremarkable CT of the head. Electronically Signed   By: Kristine Garbe M.D.   On: 09/28/2018 19:04    ASSESSMENT AND PLAN:   Active Problems:   Valproic acid toxicity  *Valproic acid toxicity. Altered mental status   Levels are at 183> 127.  Patient will be moved to medical unit with telemetry monitoring.  Started IV fluids.  Hold Depakote.  Liver function tests are stable.  Mild elevation in ammonia  is due to chronic use of Depakote.  Give some lactulose.   restart Depakote at lower dose of 1000 milligrams daily as levels are therapeutic( < 100), discussed on phone with oncall neurologist. Repeat LFTs in the morning are within normal limit.  Checked blood glucose level- normal.  Ammonia is elevated- start lactulose.  Likely due to chronic Depakote use.  Patient also have some thrombocytopenia, LFTs are within acceptable range. Neurochecks Fall precautions Seizure precaution  no abnormalities on CT head.  *Depression.  Management per psychiatry team. Appreciate help by psychiatric team.  *  DVT prophylaxis with Lovenox    All the records are reviewed and case discussed with Care Management/Social Workerr. Management plans discussed with the patient, family and they are in agreement.  CODE STATUS: Full.  TOTAL TIME TAKING CARE OF THIS PATIENT: 35 minutes.    POSSIBLE D/C IN 2-3 DAYS, DEPENDING ON CLINICAL CONDITION.   [name] M.D on 09/30/2018   Between 7am to 6pm - Pager - [phone]  After 6pm go to [email or web address] - password [email or web address] [facility]  Office  [phone]  CC: Primary care physician; Patient, No Pcp Per  Note: This dictation was prepared with Dragon dictation along with smaller phrase technology. Any transcriptional errors that result from this process are unintentional.

## 2018-10-01 LAB — CBC
HCT: 33.1 % — ABNORMAL LOW (ref 36.0–46.0)
Hemoglobin: 11.1 g/dL — ABNORMAL LOW (ref 12.0–15.0)
MCH: 33.3 pg (ref 26.0–34.0)
MCHC: 33.5 g/dL (ref 30.0–36.0)
MCV: 99.4 fL (ref 80.0–100.0)
Platelets: 78 10*3/uL — ABNORMAL LOW (ref 150–400)
RBC: 3.33 MIL/uL — ABNORMAL LOW (ref 3.87–5.11)
RDW: 14.4 % (ref 11.5–15.5)
WBC: 4.2 10*3/uL (ref 4.0–10.5)
nRBC: 0 % (ref 0.0–0.2)

## 2018-10-01 MED ORDER — PRO-STAT SUGAR FREE PO LIQD
30.0000 mL | Freq: Three times a day (TID) | ORAL | Status: DC
  Administered 2018-10-01 – 2018-10-04 (×9): 30 mL via ORAL

## 2018-10-01 MED ORDER — LACTULOSE 10 GM/15ML PO SOLN: 20.0000 g | Freq: Every day | ORAL | Status: DC

## 2018-10-01 MED ORDER — VITAMIN B-12 1000 MCG PO TABS
1000.0000 ug | ORAL_TABLET | Freq: Every day | ORAL | Status: DC
  Administered 2018-10-01 – 2018-10-05 (×5): 1000 ug via ORAL
  Filled 2018-10-01 (×5): qty 1

## 2018-10-01 MED ORDER — VITAMIN B-1 100 MG PO TABS
100.0000 mg | ORAL_TABLET | Freq: Every day | ORAL | Status: DC
  Administered 2018-10-01 – 2018-10-05 (×5): 100 mg via ORAL
  Filled 2018-10-01 (×5): qty 1

## 2018-10-01 MED ORDER — FOLIC ACID 1 MG PO TABS
1.0000 mg | ORAL_TABLET | Freq: Every day | ORAL | Status: DC
  Administered 2018-10-01 – 2018-10-05 (×5): 1 mg via ORAL
  Filled 2018-10-01 (×5): qty 1

## 2018-10-01 NOTE — Care Management Important Message (Signed)
Important Message  Patient Details  Name: Kim [name] MRN: [id number] Date of Birth: [birth year]   Medicare Important Message Given:  Yes    Dannette Barbara 10/01/2018, 12:32 PM

## 2018-10-01 NOTE — Consult Note (Signed)
  Psychiatry consult.  Patient was seen today by psychiatry consultant Dr. Mallie Darting who asked me to check in with the patient as I had seen her when she was downstairs.  This is a patient with a history of mood disorder and seizure disorder who had been admitted to the psychiatric ward last week with confusion.  Over the weekend was found to have developed valproic acid toxicity.  Has been on the medical ward the past several days.  I found the patient today in bed initially asleep but easily awakable..  Patient was able to tell me where she was and the correct year.  She was not able to give a very clear account of her recent medical history only repeating "I was having a hard time" and "I was not taking my medicine right".  These of the same thing she was saying when she was downstairs.  When I did tell her that her Depakote level had been high she said that she had been told that.  Patient had a somewhat constrained affect.  Was not speaking a great deal spontaneously but had been pretty tired.  Did not seem to be responding to internal stimuli.  No sign of psychosis.  Describes her mood is fine.  My experience with the patient had been limited last week having only seen her a couple days on the ward.  Without seeing her up and moving around ambulatory it somewhat hard to tell how different she is right now from how she was when she came in.  I would certainly say that she does not seem any worse.  May be starting to return to baseline.  Having her check in by telephone with her family would probably be another good way to judge this.  I will check by on the patient tomorrow as well.

## 2018-10-01 NOTE — Progress Notes (Signed)
[facility] at [place] NAME: Kim [name]    MR#:  [id number]  DATE OF BIRTH:  December 07, 1965  SUBJECTIVE:  CHIEF COMPLAINT:  No chief complaint on file.  Transferred to medical service for confusion. Depakot level was high. More alert today, talking some , less confused. No focal weakness.  No complaint of diarrhea today.  REVIEW OF SYSTEMS:   Review of Systems  Constitutional: Negative for fever.  HENT: Negative for congestion, ear pain and hearing loss.   Eyes: Negative for blurred vision and double vision.  Respiratory: Negative for cough, sputum production and shortness of breath.   Cardiovascular: Negative for chest pain, palpitations and orthopnea.  Gastrointestinal: Positive for diarrhea. Negative for abdominal pain, nausea and vomiting.  Genitourinary: Negative for dysuria, frequency and urgency.  Musculoskeletal: Negative for back pain and myalgias.  Skin: Negative for rash.  Neurological: Positive for tremors. Negative for dizziness, sensory change and focal weakness.  Psychiatric/Behavioral: Negative for depression.    DRUG ALLERGIES:   Allergies  Allergen Reactions  . Ezogabine Other (See Comments)    Unknown to patient  . Lacosamide Other (See Comments)    Unknown  . Levetiracetam Other (See Comments)    unknown  . Other Other (See Comments)    unknown unknown  . Perampanel Other (See Comments)    Unknown to patient  . Prednisone Other (See Comments)    unknown  . Tizanidine Other (See Comments)    unknown    VITALS:  Blood pressure (!) 98/51, pulse 71, temperature 98.1 F (36.7 C), temperature source Oral, resp. rate 16, height 5\' 6"  (1.676 m), weight 69.1 kg, SpO2 99 %.  PHYSICAL EXAMINATION:   GENERAL:  53 y.o.-year-old patientsitting in the bed with no acute distress.  EYES: Pupils equal, round, reactive to light and accommodation. No scleral icterus. Extraocular muscles intact.  HEENT: Head atraumatic,  normocephalic. Oropharynx and nasopharynx clear. No oropharyngeal erythema, moist oral mucosa  NECK:  Supple, no jugular venous distention. No thyroid enlargement, no tenderness.  LUNGS: Normal breath sounds bilaterally, no wheezing, rales, rhonchi. No use of accessory muscles of respiration.  CARDIOVASCULAR: S1, S2 normal. No murmurs, rubs, or gallops.  ABDOMEN: Soft, nontender, nondistended. Bowel sounds present. No organomegaly or mass.  EXTREMITIES: No pedal edema, cyanosis, or clubbing. + 2 pedal & radial pulses b/l.   NEUROLOGIC: Following instructions but moves all 4 extremities, alert and oriented X3.  Have coarse tremors. PSYCHIATRIC: The patient is confused.  SKIN: No obvious rash, lesion, or ulcer.   Physical Exam LABORATORY PANEL:   CBC Recent Labs  Lab 10/01/18 0425  WBC 4.2  HGB 11.1*  HCT 33.1*  PLT 78*   ------------------------------------------------------------------------------------------------------------------  Chemistries  Recent Labs  Lab 09/30/18 0415  NA 138  K 3.5  CL 112*  CO2 18*  GLUCOSE 73  BUN 9  CREATININE 0.80  CALCIUM 7.9*  AST 16  ALT 10  ALKPHOS 46  BILITOT 0.5   ------------------------------------------------------------------------------------------------------------------  Cardiac Enzymes No results for input(s): TROPONINI in the last 168 hours. ------------------------------------------------------------------------------------------------------------------  RADIOLOGY:  No results found.  ASSESSMENT AND PLAN:   Active Problems:   Valproic acid toxicity  *Valproic acid toxicity. Altered mental status   Levels are at 183> 127.  Patient will be moved to medical unit with telemetry monitoring.  Started IV fluids.  Hold Depakote.  Liver function tests are stable.  Mild elevation in ammonia is due to chronic use of Depakote.  Give  some lactulose.  restart Depakote at lower dose of 1000 milligrams daily as levels are  therapeutic( < 100), discussed on phone with oncall neurologist. Repeat LFTs in the morning are within normal limit.  Checked blood glucose level- normal.  Ammonia is elevated- start lactulose-decrease dose now.  Likely due to chronic Depakote use.  Patient also have some thrombocytopenia, LFTs are within acceptable range. Neurochecks Fall precautions Seizure precaution  no abnormalities on CT head. Patient is improving now.  I will decrease the lactulose dose.  *Depression.  Management per psychiatry team. Appreciate help by psychiatric team.  *  DVT prophylaxis with Lovenox    All the records are reviewed and case discussed with Care Management/Social Workerr. Management plans discussed with the patient, family and they are in agreement.  CODE STATUS: Full.  TOTAL TIME TAKING CARE OF THIS PATIENT: 35 minutes.  Spoke to patient's daughter on the phone.  She was able to walk in the hospital today.  POSSIBLE D/C IN 1-2 DAYS, DEPENDING ON CLINICAL CONDITION.   [name] M.D on 10/01/2018   Between 7am to 6pm - Pager - [phone]  After 6pm go to [email or web address] - password [email or web address] [facility]  Office  [phone]  CC: Primary care physician; Patient, No Pcp Per  Note: This dictation was prepared with Dragon dictation along with smaller phrase technology. Any transcriptional errors that result from this process are unintentional.

## 2018-10-01 NOTE — Consult Note (Signed)
Psychiatric consult follow-up progress note.  Principal diagnosis: #1 valproic acid toxicity, #2 hepatic encephalopathy versus delirium, #3 history of bipolar disorder , #4 history of seizure disorder  Subjective: Patient is a 53 year old female with a past psychiatric history significant for bipolar disorder and recent valproic acid toxicity.  Resulting in hepatic encephalopathy versus delirium.  Objective: Patient is seen and examined.  Patient is a 53 year old female with the above-stated past psychiatric history who is seen in follow-up.  She continues to slowly clear.  She is alert and oriented x3 today with a little bit of assistance.  She seems more awake and alert.  She continues to complain of diarrhea, most likely secondary to MiraLAX and lactulose.  She denied any auditory or visual hallucinations.  No other evidence of racing thoughts or pressured speech.  She denied any suicidality or homicidality.  She denied any depressive symptoms.  Her main complaint today is that she wants to get home and see her grandchildren and her dog.  Her laboratories from 4/15 showed an ammonia of 59.  2 days ago it was 68, and 4 days ago was 45.  Her CBC from 4/16 showed a mild elevation in her white count from 3.8-4.2.  She remains anemic with a hemoglobin of 11.1 and hematocrit of 33.1.  Her platelets are at 78,000.  Those are stable from yesterday's evaluation.  Her blood pressure is 154/84, respirations are 18, she is afebrile and her pulse is 83.  Current medications: Depakote ER thousand milligrams p.o. daily Lovenox 40 mg subcu every 24 hours Folic acid 1 mg p.o. daily Lactulose 30 g p.o. twice daily Zofran 4 mg every 6 hours as needed nausea Trileptal 450 mg p.o. twice daily Pantoprazole 40 mg p.o. daily MiraLAX 17 g in 8 ounces of water daily PRN for constipation Seroquel 50 mg p.o. nightly as needed insomnia Thiamine 100 mg p.o. daily Topiramate 150 mg p.o. twice daily Cyanocobalamin thousand  micrograms p.o. daily  Review of systems-as per above  Examination: Patient is alert and oriented x3.  Polite, pleasant but still mildly lethargic.  Fair eye contact.  Still continued psychomotor depression.  She denied any helplessness, hopelessness or worthlessness.  She denied any suicidal ideation.  No evidence of racing thoughts or pressured speech.  She denied any auditory or visual hallucinations.  No evidence of euphoria.  Cognitively she is intact and slowly improving but still impaired to a mild degree.  Problem list and plan: #1 Depakote toxicity, #2 hepatic encephalopathy versus delirium, #3 history of bipolar disorder. Patient is seen and examined.  Patient is a 53 year old female with the above-stated past medical and psychiatric history who is seen in follow-up.  Her cognition continues to slowly improve.  I am unsure what her baseline function was prior to her psychiatric hospitalization.  I may ask someone from inpatient psychiatry to come see her and does give me an idea of what her baseline function truly is.  Otherwise the only change I am going to make today is to give her the Seroquel as a standing dosage at 50 mg p.o. nightly.  I mainly want to see whether or not this over sedates her during the day, and if necessary we will reduce it.  Otherwise no change in her current treatment. 1.  Change Seroquel to 50 mg p.o. nightly standing 2.  Will ask inpatient psychiatry to come see the patient and evaluate to see what her baseline cognitive function was like prior to admission.  At  least according to the notes on admission she was significantly manic at that time. 3.  Consult service will continue to follow the patient with you.

## 2018-10-02 ENCOUNTER — Inpatient Hospital Stay: Payer: Medicare Other

## 2018-10-02 DIAGNOSIS — T426X1A Poisoning by other antiepileptic and sedative-hypnotic drugs, accidental (unintentional), initial encounter: Principal | ICD-10-CM

## 2018-10-02 LAB — COMPREHENSIVE METABOLIC PANEL
ALT: 13 U/L (ref 0–44)
AST: 19 U/L (ref 15–41)
Albumin: 2.9 g/dL — ABNORMAL LOW (ref 3.5–5.0)
Alkaline Phosphatase: 48 U/L (ref 38–126)
Anion gap: 6 (ref 5–15)
BUN: 6 mg/dL (ref 6–20)
CO2: 20 mmol/L — ABNORMAL LOW (ref 22–32)
Calcium: 8 mg/dL — ABNORMAL LOW (ref 8.9–10.3)
Chloride: 115 mmol/L — ABNORMAL HIGH (ref 98–111)
Creatinine, Ser: 0.8 mg/dL (ref 0.44–1.00)
GFR calc Af Amer: >60 mL/min (ref >60)
GFR calc non Af Amer: >60 mL/min (ref >60)
Glucose, Bld: 78 mg/dL (ref 70–99)
Potassium: 3.2 mmol/L — ABNORMAL LOW (ref 3.5–5.1)
Sodium: 141 mmol/L (ref 135–145)
Total Bilirubin: 0.7 mg/dL (ref 0.3–1.2)
Total Protein: 4.9 g/dL — ABNORMAL LOW (ref 6.5–8.1)

## 2018-10-02 LAB — CBC
HCT: 32 % — ABNORMAL LOW (ref 36.0–46.0)
Hemoglobin: 10.7 g/dL — ABNORMAL LOW (ref 12.0–15.0)
MCH: 32.2 pg (ref 26.0–34.0)
MCHC: 33.4 g/dL (ref 30.0–36.0)
MCV: 96.4 fL (ref 80.0–100.0)
Platelets: 76 10*3/uL — ABNORMAL LOW (ref 150–400)
RBC: 3.32 MIL/uL — ABNORMAL LOW (ref 3.87–5.11)
RDW: 14.6 % (ref 11.5–15.5)
WBC: 3.6 10*3/uL — ABNORMAL LOW (ref 4.0–10.5)
nRBC: 0 % (ref 0.0–0.2)

## 2018-10-02 LAB — AMMONIA: Ammonia: 88 umol/L — ABNORMAL HIGH (ref 9–35)

## 2018-10-02 LAB — VALPROIC ACID LEVEL: Valproic Acid Lvl: 88 ug/mL (ref 50.0–100.0)

## 2018-10-02 MED ORDER — LACTULOSE 10 GM/15ML PO SOLN
30.0000 g | Freq: Two times a day (BID) | ORAL | Status: DC
  Administered 2018-10-02 (×2): 30 g via ORAL
  Filled 2018-10-02 (×2): qty 60

## 2018-10-02 MED ORDER — RIFAXIMIN 550 MG PO TABS
550.0000 mg | ORAL_TABLET | Freq: Two times a day (BID) | ORAL | Status: DC
  Administered 2018-10-02 – 2018-10-05 (×7): 550 mg via ORAL
  Filled 2018-10-02 (×8): qty 1

## 2018-10-02 MED ORDER — CLOBAZAM 10 MG PO TABS
10.0000 mg | ORAL_TABLET | Freq: Every day | ORAL | Status: DC
  Administered 2018-10-02 – 2018-10-05 (×4): 10 mg via ORAL
  Filled 2018-10-02 (×4): qty 1

## 2018-10-02 MED ORDER — BLISTEX MEDICATED EX OINT
TOPICAL_OINTMENT | CUTANEOUS | Status: DC | PRN
  Administered 2018-10-02: 22:00:00 via TOPICAL
  Filled 2018-10-02: qty 6.3

## 2018-10-02 MED ORDER — POTASSIUM CHLORIDE CRYS ER 20 MEQ PO TBCR
40.0000 meq | EXTENDED_RELEASE_TABLET | Freq: Two times a day (BID) | ORAL | Status: AC
  Administered 2018-10-02 (×2): 40 meq via ORAL
  Filled 2018-10-02 (×2): qty 2

## 2018-10-02 NOTE — Progress Notes (Signed)
[facility] at [place] NAME: Taylynn Easton    MR#:  [id number]  DATE OF BIRTH:  [birth year]  SUBJECTIVE:  CHIEF COMPLAINT:  No chief complaint on file.  Transferred to medical service for confusion. Depakot level was high. More alert today, talking some , less confused. No focal weakness.  No complaint of diarrhea today. She is more alert and was able to walk around nursing station with minimal tremors.  REVIEW OF SYSTEMS:   Review of Systems  Constitutional: Negative for fever.  HENT: Negative for congestion, ear pain and hearing loss.   Eyes: Negative for blurred vision and double vision.  Respiratory: Negative for cough, sputum production and shortness of breath.   Cardiovascular: Negative for chest pain, palpitations and orthopnea.  Gastrointestinal: Positive for diarrhea. Negative for abdominal pain, nausea and vomiting.  Genitourinary: Negative for dysuria, frequency and urgency.  Musculoskeletal: Negative for back pain and myalgias.  Skin: Negative for rash.  Neurological: Positive for tremors. Negative for dizziness, sensory change and focal weakness.  Psychiatric/Behavioral: Negative for depression.    DRUG ALLERGIES:   Allergies  Allergen Reactions  . Ezogabine Other (See Comments)    Unknown to patient  . Lacosamide Other (See Comments)    Unknown  . Levetiracetam Other (See Comments)    unknown  . Other Other (See Comments)    unknown unknown  . Perampanel Other (See Comments)    Unknown to patient  . Prednisone Other (See Comments)    unknown  . Tizanidine Other (See Comments)    unknown    VITALS:  Blood pressure 105/69, pulse 67, temperature 97.9 F (36.6 C), temperature source Oral, resp. rate 18, height 5\' 6"  (1.676 m), weight 69.1 kg, SpO2 96 %.  PHYSICAL EXAMINATION:   GENERAL:  53 y.o.-year-old patientsitting in the bed with no acute distress.  EYES: Pupils equal, round, reactive to light and  accommodation. No scleral icterus. Extraocular muscles intact.  HEENT: Head atraumatic, normocephalic. Oropharynx and nasopharynx clear. No oropharyngeal erythema, moist oral mucosa  NECK:  Supple, no jugular venous distention. No thyroid enlargement, no tenderness.  LUNGS: Normal breath sounds bilaterally, no wheezing, rales, rhonchi. No use of accessory muscles of respiration.  CARDIOVASCULAR: S1, S2 normal. No murmurs, rubs, or gallops.  ABDOMEN: Soft, nontender, nondistended. Bowel sounds present. No organomegaly or mass.  EXTREMITIES: No pedal edema, cyanosis, or clubbing. + 2 pedal & radial pulses b/l.   NEUROLOGIC: Following instructions but moves all 4 extremities, alert and oriented X3.  Have minimal tremors. PSYCHIATRIC: The patient is  alert and oriented x3.  SKIN: No obvious rash, lesion, or ulcer.   Physical Exam LABORATORY PANEL:   CBC Recent Labs  Lab 10/02/18 0422  WBC 3.6*  HGB 10.7*  HCT 32.0*  PLT 76*   ------------------------------------------------------------------------------------------------------------------  Chemistries  Recent Labs  Lab 10/02/18 0422  NA 141  K 3.2*  CL 115*  CO2 20*  GLUCOSE 78  BUN 6  CREATININE 0.80  CALCIUM 8.0*  AST 19  ALT 13  ALKPHOS 48  BILITOT 0.7   ------------------------------------------------------------------------------------------------------------------  Cardiac Enzymes No results for input(s): TROPONINI in the last 168 hours. ------------------------------------------------------------------------------------------------------------------  RADIOLOGY:  US Abdomen Limited Ruq  Result Date: 10/02/2018 CLINICAL DATA:  53 year old female with cirrhosis EXAM: ULTRASOUND ABDOMEN LIMITED RIGHT UPPER QUADRANT COMPARISON:  None. FINDINGS: Gallbladder: No gallstones or wall thickening visualized. No sonographic Murphy sign noted by sonographer. Common bile duct: Diameter: 2 mm-3 mm Liver: No focal  lesion of the  liver. No significantly nodular contour of the liver. Unremarkable liver echotexture. Portal vein is patent on color Doppler imaging with normal direction of blood flow towards the liver. Incidental right pleural fluid. IMPRESSION: No overt signs of cirrhosis on the ultrasound. Unremarkable gallbladder. Trace right pleural effusion. Electronically Signed   By: Corrie Mckusick D.O.   On: 10/02/2018 09:27    ASSESSMENT AND PLAN:   Active Problems:   Valproic acid toxicity  *Valproic acid toxicity. Altered mental status   Levels are at 183> 127.   moved to medical unit with telemetry monitoring.  Started IV fluids.  Hold Depakote.  Liver function tests are stable.  Mild elevation in ammonia is due to chronic use of Depakote.  Given some lactulose.  restart Depakote at lower dose of 1000 milligrams daily as levels are therapeutic( < 100), discussed on phone with oncall neurologist. Repeat LFTs in the morning are within normal limit.  Checked blood glucose level- normal.  Ammonia is elevated- staredt lactulose   Patient also have some thrombocytopenia, LFTs are within acceptable range. Get RUQ sono as she was an alcoholic for many years quit 6 mths ago. Neurochecks Fall precautions Seizure precaution  no abnormalities on CT head. Patient is improving now. Ammonia went up again- will add rifaximine Called Neurology consult to help re-adjust meds.  *Depression.  Management per psychiatry team. Appreciate help by psychiatric team.  *  DVT prophylaxis with Lovenox  All the records are reviewed and case discussed with Care Management/Social Workerr. Management plans discussed with the patient, family and they are in agreement.  CODE STATUS: Full.  TOTAL TIME TAKING CARE OF THIS PATIENT: 35 minutes.  Spoke to patient's daughter on the phone.  She was able to walk in the hospital today. Spoke to neurologist.  POSSIBLE D/C IN 1-2 DAYS, DEPENDING ON CLINICAL CONDITION.   [name] M.D on 10/02/2018   Between 7am to 6pm - Pager - [phone]  After 6pm go to [email or web address] - password [email or web address]  Office  [phone]  CC: Primary care physician; Patient, No Pcp Per  Note: This dictation was prepared with Dragon dictation along with smaller phrase technology. Any transcriptional errors that result from this process are unintentional.

## 2018-10-02 NOTE — Consult Note (Addendum)
Requesting Physician: Dr. Anselm Jungling    Chief Complaint: Confusion  History obtained from: Patient and Chart     HPI:                                                                                                                                       Kim [name] is an 53 y.o. female with PMH of refractory epilepsy, bipolar disorder, migraine headaches, alcohol abuse   on Depakote, Trokendi and Trileptal and Onfi presents to ED with worsening confusion, ataxia and drowsiness. Noted have elevated VPA level of 132 and Ammonia level of 45 . Patient was  subsequently admitted and Depakote discontinued. Depakote was resumed at lower dose after returning to therapeutic levels, however today ammonia was elevated to 88. VPA level was also 88 this morning. She has been on VPA for several years.  Neurology consulted for medication recommendations.  She has been on these medication for more than year without any issues. Last seizure was March 26.  Previously, she has tried and failed Vimpat, nausea or vomiting, Keppra, depression, Fycompa, nause, Aptiom, depression. Lamictal-night mare, crawling sensation.  .                Past Medical History:  Diagnosis Date  . Depression   . Migraines   . Neck pain   . Seizures ([place])     Past Surgical History:  Procedure Laterality Date  . IMPLANTATION VAGAL NERVE STIMULATOR     2001, 2008, 2014    Family History  Problem Relation Age of Onset  . Hyperlipidemia Mother   . Hypertension Mother   . Hyperlipidemia Father   . Hypertension Father   . Lung cancer Father   . Diabetes Father   . Heart disease Father    Social History:  reports that she has quit smoking. She has never used smokeless tobacco. She reports previous alcohol use. She reports current drug use. Frequency: 3.00 times per week. Drug: Marijuana.  Allergies:  Allergies  Allergen Reactions  . Ezogabine Other (See Comments)    Unknown to patient  . Lacosamide Other (See Comments)    Unknown  . Levetiracetam Other (See Comments)    unknown  . Other Other (See Comments)    unknown unknown  . Perampanel Other (See Comments)    Unknown to patient  . Prednisone Other (See Comments)    unknown  . Tizanidine Other (See Comments)    unknown    Medications:  I reviewed home medications   ROS:                                                                                                                                     14 systems reviewed and negative except above    Examination:                                                                                                      General: Appears well-developed and well-nourished.  Psych: Affect appropriate to situation Eyes: No scleral injection HENT: No OP obstrucion Head: Normocephalic.  Cardiovascular: Normal rate and regular rhythm.  Respiratory: Effort normal and breath sounds normal to anterior ascultation GI: Soft.  No distension. There is no tenderness.  Skin: WDI    Neurological Examination Mental Status: Drowsy, oriented to place, person and month. Slightly confusd Speech fluent without evidence of aphasia. Able to follow 3 step commands without difficulty. Cranial Nerves: II: Visual fields grossly normal,  III,IV, VI: ptosis not present, extra-ocular motions intact bilaterally, pupils equal, round, reactive to light and accommodation V,VII: smile symmetric, facial light touch sensation normal bilaterally VIII: hearing normal bilaterally IX,X: uvula rises symmetrically XI: bilateral shoulder shrug XII: midline tongue extension Motor: Right : Upper extremity   5/5    Left:     Upper extremity   5/5  Lower extremity   5/5     Lower extremity   5/5 Tone and bulk:normal tone throughout; no atrophy noted, Mild asterixis in bilateral upper extremities  Sensory: Pinprick and light  touch intact throughout, bilaterally Deep Tendon Reflexes: 2+ and symmetric throughout Plantars: Right: downgoing   Left: downgoing Cerebellar: normal finger-to-nose, normal rapid alternating movements and normal heel-to-shin test Gait: normal gait and station     Lab Results: Basic Metabolic Panel: Recent Labs  Lab 09/27/18 1743 09/28/18 0423 09/30/18 0415 10/02/18 0422  NA 138 138 138 141  K 3.1* 4.2 3.5 3.2*  CL 106 113* 112* 115*  CO2 21* 15* 18* 20*  GLUCOSE 92 78 73 78  BUN 23* 19 9 6   CREATININE 1.20* 0.93 0.80 0.80  CALCIUM 8.7* 7.9* 7.9* 8.0*    CBC: Recent Labs  Lab 09/26/18 1118  09/28/18 0423 09/29/18 0346 09/30/18 0419 10/01/18 0425 10/02/18 0422  WBC 6.1   < > 4.3 3.7* 3.8* 4.2 3.6*  NEUTROABS 4.2  --   --   --   --   --   --   HGB 14.2   < >  13.2 11.3* 10.9* 11.1* 10.7*  HCT 41.9   < > 40.6 34.6* 33.2* 33.1* 32.0*  MCV 96.3   < > 100.5* 100.6* 100.3* 99.4 96.4  PLT 120*   < > 108* 87* 78* 78* 76*   < > = values in this interval not displayed.    Coagulation Studies: No results for input(s): LABPROT, INR in the last 72 hours.  Imaging: US Abdomen Limited Ruq  Result Date: 10/02/2018 CLINICAL DATA:  53 year old female with cirrhosis EXAM: ULTRASOUND ABDOMEN LIMITED RIGHT UPPER QUADRANT COMPARISON:  None. FINDINGS: Gallbladder: No gallstones or wall thickening visualized. No sonographic Murphy sign noted by sonographer. Common bile duct: Diameter: 2 mm-3 mm Liver: No focal lesion of the liver. No significantly nodular contour of the liver. Unremarkable liver echotexture. Portal vein is patent on color Doppler imaging with normal direction of blood flow towards the liver. Incidental right pleural fluid. IMPRESSION: No overt signs of cirrhosis on the ultrasound. Unremarkable gallbladder. Trace right pleural effusion. Electronically Signed   By: Corrie Mckusick D.O.   On: 10/02/2018 09:27    ASSESSMENT AND PLAN   53 y.o. female with PMH of refractory  epilepsy with VNS placment, bipolar disorder, migraine headaches, alcohol abuse with VPA toxicity and hyperammonemia. Ammonia levels elevated today, despite normal VPA levels.   Hyperammonemia Valproic acid toxicity  Recommendations Discontinue Depakote Continue Topamax and Trileptal, Onfi Recheck Ammonia levels tomorrow  Will continue to follow . Spoke with DR [name] who felt we can increase Onfi if needed     [facility] Pager Number [phone]

## 2018-10-03 LAB — BASIC METABOLIC PANEL
Anion gap: 7 (ref 5–15)
BUN: 8 mg/dL (ref 6–20)
CO2: 20 mmol/L — ABNORMAL LOW (ref 22–32)
Calcium: 8.6 mg/dL — ABNORMAL LOW (ref 8.9–10.3)
Chloride: 117 mmol/L — ABNORMAL HIGH (ref 98–111)
Creatinine, Ser: 0.78 mg/dL (ref 0.44–1.00)
GFR calc Af Amer: >60 mL/min (ref >60)
GFR calc non Af Amer: >60 mL/min (ref >60)
Glucose, Bld: 84 mg/dL (ref 70–99)
Potassium: 3.9 mmol/L (ref 3.5–5.1)
Sodium: 144 mmol/L (ref 135–145)

## 2018-10-03 LAB — AMMONIA: Ammonia: 62 umol/L — ABNORMAL HIGH (ref 9–35)

## 2018-10-03 LAB — CBC
HCT: 34.5 % — ABNORMAL LOW (ref 36.0–46.0)
Hemoglobin: 11.5 g/dL — ABNORMAL LOW (ref 12.0–15.0)
MCH: 32.8 pg (ref 26.0–34.0)
MCHC: 33.3 g/dL (ref 30.0–36.0)
MCV: 98.3 fL (ref 80.0–100.0)
Platelets: 78 10*3/uL — ABNORMAL LOW (ref 150–400)
RBC: 3.51 MIL/uL — ABNORMAL LOW (ref 3.87–5.11)
RDW: 15.2 % (ref 11.5–15.5)
WBC: 4.1 10*3/uL (ref 4.0–10.5)
nRBC: 0 % (ref 0.0–0.2)

## 2018-10-03 LAB — HEPATITIS PANEL, ACUTE
HCV Ab: <0.1 s/co ratio (ref 0.0–0.9)
Hep A IgM: NEGATIVE
Hep B C IgM: NEGATIVE
Hepatitis B Surface Ag: NEGATIVE

## 2018-10-03 NOTE — Progress Notes (Signed)
[facility] at [place] NAME: Kim [name]    MR#:  [id number]  DATE OF BIRTH:  1965/07/06  SUBJECTIVE:  CHIEF COMPLAINT:  No chief complaint on file.  Awake. Feels weak  Lives alone  REVIEW OF SYSTEMS:   Review of Systems  Constitutional: Positive for malaise/fatigue. Negative for fever.  HENT: Negative for congestion, ear pain and hearing loss.   Eyes: Negative for blurred vision and double vision.  Respiratory: Negative for cough, sputum production and shortness of breath.   Cardiovascular: Negative for chest pain, palpitations and orthopnea.  Gastrointestinal: Positive for diarrhea. Negative for abdominal pain, nausea and vomiting.  Genitourinary: Negative for dysuria, frequency and urgency.  Musculoskeletal: Negative for back pain and myalgias.  Skin: Negative for rash.  Neurological: Negative for dizziness, sensory change and focal weakness.  Psychiatric/Behavioral: Negative for depression.    DRUG ALLERGIES:   Allergies  Allergen Reactions  . Ezogabine Other (See Comments)    Unknown to patient  . Lacosamide Other (See Comments)    Unknown  . Levetiracetam Other (See Comments)    unknown  . Other Other (See Comments)    unknown unknown  . Perampanel Other (See Comments)    Unknown to patient  . Prednisone Other (See Comments)    unknown  . Tizanidine Other (See Comments)    unknown    VITALS:  Blood pressure (!) 142/88, pulse 72, temperature 98.4 F (36.9 C), temperature source Oral, resp. rate 18, height 5\' 6"  (1.676 m), weight 69.1 kg, SpO2 99 %.  PHYSICAL EXAMINATION:   GENERAL:  53 y.o.-year-old patientsitting in the bed with no acute distress.  EYES: Pupils equal, round, reactive to light and accommodation. No scleral icterus. Extraocular muscles intact.  HEENT: Head atraumatic, normocephalic. Oropharynx and nasopharynx clear. No oropharyngeal erythema, moist oral mucosa  NECK:  Supple, no jugular venous  distention. No thyroid enlargement, no tenderness.  LUNGS: Normal breath sounds bilaterally, no wheezing, rales, rhonchi. No use of accessory muscles of respiration.  CARDIOVASCULAR: S1, S2 normal. No murmurs, rubs, or gallops.  ABDOMEN: Soft, nontender, nondistended. Bowel sounds present. No organomegaly or mass.  EXTREMITIES: No pedal edema, cyanosis, or clubbing. + 2 pedal & radial pulses b/l.   NEUROLOGIC: Following instructions but moves all 4 extremities, alert and oriented X3.  Have minimal tremors. PSYCHIATRIC: The patient is  alert and oriented x3.  SKIN: No obvious rash, lesion, or ulcer.   Physical Exam LABORATORY PANEL:   CBC Recent Labs  Lab 10/03/18 0429  WBC 4.1  HGB 11.5*  HCT 34.5*  PLT 78*   ------------------------------------------------------------------------------------------------------------------  Chemistries  Recent Labs  Lab 10/02/18 0422 10/03/18 0429  NA 141 144  K 3.2* 3.9  CL 115* 117*  CO2 20* 20*  GLUCOSE 78 84  BUN 6 8  CREATININE 0.80 0.78  CALCIUM 8.0* 8.6*  AST 19  --   ALT 13  --   ALKPHOS 48  --   BILITOT 0.7  --    ------------------------------------------------------------------------------------------------------------------  Cardiac Enzymes No results for input(s): TROPONINI in the last 168 hours. ------------------------------------------------------------------------------------------------------------------  RADIOLOGY:  US Abdomen Limited Ruq  Result Date: 10/02/2018 CLINICAL DATA:  53 year old female with cirrhosis EXAM: ULTRASOUND ABDOMEN LIMITED RIGHT UPPER QUADRANT COMPARISON:  None. FINDINGS: Gallbladder: No gallstones or wall thickening visualized. No sonographic Murphy sign noted by sonographer. Common bile duct: Diameter: 2 mm-3 mm Liver: No focal lesion of the liver. No significantly nodular contour of the liver. Unremarkable liver  echotexture. Portal vein is patent on color Doppler imaging with normal  direction of blood flow towards the liver. Incidental right pleural fluid. IMPRESSION: No overt signs of cirrhosis on the ultrasound. Unremarkable gallbladder. Trace right pleural effusion. Electronically Signed   By: Corrie Mckusick D.O.   On: 10/02/2018 09:27    ASSESSMENT AND PLAN:   Active Problems:   Valproic acid toxicity  *Valproic acid toxicity with acute toxic encephalopathy - resolved   Levels are down. Stopped depakote  * Ammonia is elevated- due to depakote. Treding down Stop lactulose No cirrhosis  * Epilepsy Depakote stopped. Continue Topamax and Trileptal, Onfi  *Depression.  Management per psychiatry team. Appreciate help by psychiatric team.  *  DVT prophylaxis with Lovenox  All the records are reviewed and case discussed with Care Management/Social Workerr. Management plans discussed with the patient, family and they are in agreement.  CODE STATUS: Full.  TOTAL TIME TAKING CARE OF THIS PATIENT: 35 minutes.   POSSIBLE D/C IN 1-2 DAYS, DEPENDING ON CLINICAL CONDITION.   Neita Carp M.D on 10/03/2018   Between 7am to 6pm - Pager - [phone]  After 6pm go to [email or web address] - password [email or web address] [facility]  Office  [phone]  CC: Primary care physician; Patient, No Pcp Per  Note: This dictation was prepared with Dragon dictation along with smaller phrase technology. Any transcriptional errors that result from this process are unintentional.

## 2018-10-03 NOTE — Progress Notes (Signed)
Ammonia trending down. Continue Topamax, Onfi.   Follow up in 1-2 weeks with Dr [name].

## 2018-10-03 NOTE — Evaluation (Signed)
Physical Therapy Evaluation Patient Details Name: Randalyn Ahmed MRN: [id number] DOB: 1965/08/27 Today's Date: 10/03/2018   History of Present Illness   53 y.o. female with PMH of refractory epilepsy, bipolar disorder, migraine headaches, alcohol abuse   on Depakote, Trokendi and Trileptal and Onfi presents to ED with worsening confusion, ataxia and drowsiness. Noted have elevated VPA level of 132 and Ammonia level of 45 .     Clinical Impression  Patient eager to mobilize at start of session, moved to sit EOB with supervision. Pt questionable historian in regards to PLOF, conflicting information provided about bathroom set-up/family. Stated her grandson lives with her every weekend, and her husband checks on her but does not live with her. Reported being independent with ADLs/IADLs prior to hospital admission. No family at bedside to confirm cognitive status at baseline, pt oriented to self and location. Pt did demonstrate deficits in problem solving/short term memory during session.  Patient ambulated ~116ft without AD supervision/CGA. Pt with decreased arm swing bilaterally, short, shuffling steps, mild deviations in gait path noted but no LOB noted. Decreased gait velocity noted as well. Higher level balance assessed, pt unable to perform single leg stance without physical assistance.  Overall the patient demonstrated deficits (see "PT Problem List") that impede the patient's functional abilities, safety, and mobility and would benefit from skilled PT intervention. Recommendation is HHPT with supervision/assistance 24hrs to address safety concerns.      Follow Up Recommendations Home health PT;Supervision/Assistance - 24 hour    Equipment Recommendations  None recommended by PT    Recommendations for Other Services       Precautions / Restrictions Precautions Precautions: Fall Precaution Comments: cognition Restrictions Weight Bearing Restrictions: No      Mobility  Bed  Mobility Overal bed mobility: Needs Assistance Bed Mobility: Supine to Sit     Supine to sit: Supervision        Transfers Overall transfer level: Needs assistance Equipment used: None Transfers: Sit to/from Stand Sit to Stand: Supervision         General transfer comment: uses hands to perform and steady herself initially  Ambulation/Gait Ambulation/Gait assistance: Supervision;Min guard Gait Distance (Feet): 180 Feet Assistive device: None   Gait velocity: decreased Gait velocity interpretation: 1.31 - 2.62 ft/sec, indicative of limited community ambulator General Gait Details: Pt with decreased arm swing bilaterally, short, shuffling steps, mild deviations in gait path noted.  Stairs            Wheelchair Mobility    Modified Rankin (Stroke Patients Only)       Balance Overall balance assessment: Needs assistance Sitting-balance support: Feet supported Sitting balance-Leahy Scale: Good       Standing balance-Leahy Scale: Fair   Single Leg Stance - Right Leg: 2 Single Leg Stance - Left Leg: 2     Rhomberg - Eyes Opened: 15 Rhomberg - Eyes Closed: 10                 Pertinent Vitals/Pain Pain Assessment: No/denies pain    Home Living Family/patient expects to be discharged to:: Private residence Living Arrangements: Alone Available Help at Discharge: Family;Available PRN/intermittently Type of Home: House Home Access: Stairs to enter   [employer] of Steps: 5, unable to provide whether the stairs have handrails Home Layout: One level Home Equipment: None Additional Comments: Patient is questionable historian due to mental status    Prior Function Level of Independence: Independent         Comments: stated she does  not use an AD for ambulation     Hand Dominance        Extremity/Trunk Assessment   Upper Extremity Assessment Upper Extremity Assessment: Generalized weakness    Lower Extremity  Assessment Lower Extremity Assessment: Generalized weakness    Cervical / Trunk Assessment Cervical / Trunk Assessment: Normal  Communication   Communication: No difficulties  Cognition Arousal/Alertness: Awake/alert Behavior During Therapy: WFL for tasks assessed/performed Overall Cognitive Status: No family/caregiver present to determine baseline cognitive functioning                                 General Comments: able to state her name, unable to identify year born, knew she was in a hospital, thought it was May      General Comments      Exercises     Assessment/Plan    PT Assessment Patient needs continued PT services  PT Problem List Decreased strength;Decreased safety awareness;Decreased activity tolerance;Decreased balance;Decreased cognition       PT Treatment Interventions DME instruction;Functional mobility training;Patient/family education;Balance training;Gait training;Therapeutic activities;Neuromuscular re-education;Stair training;Therapeutic exercise    PT Goals (Current goals can be found in the Care Plan section)  Acute Rehab PT Goals Patient Stated Goal: to get up and go for a walk PT Goal Formulation: With patient Time For Goal Achievement: 10/17/18 Potential to Achieve Goals: Good    Frequency Min 2X/week   Barriers to discharge        Co-evaluation               AM-PAC PT "6 Clicks" Mobility  Outcome Measure Help needed turning from your back to your side while in a flat bed without using bedrails?: None Help needed moving from lying on your back to sitting on the side of a flat bed without using bedrails?: A Little Help needed moving to and from a bed to a chair (including a wheelchair)?: A Little Help needed standing up from a chair using your arms (e.g., wheelchair or bedside chair)?: A Little Help needed to walk in hospital room?: A Little Help needed climbing 3-5 steps with a railing? : A Little 6 Click Score:  19    End of Session Equipment Utilized During Treatment: Gait belt Activity Tolerance: Patient tolerated treatment well Patient left: with call bell/phone within reach;in chair;with chair alarm set Nurse Communication: Mobility status PT Visit Diagnosis: Other abnormalities of gait and mobility (R26.89);Muscle weakness (generalized) (M62.81)    Time: 1423-1440 PT Time Calculation (min) (ACUTE ONLY): 17 min   Charges:   PT Evaluation $PT Eval Moderate Complexity: 1 Mod          Lieutenant Diego PT, DPT 3:51 PM,10/03/18 [phone]

## 2018-10-04 DIAGNOSIS — T426X1D Poisoning by other antiepileptic and sedative-hypnotic drugs, accidental (unintentional), subsequent encounter: Secondary | ICD-10-CM

## 2018-10-04 LAB — CREATININE, SERUM
Creatinine, Ser: 0.72 mg/dL (ref 0.44–1.00)
GFR calc Af Amer: >60 mL/min (ref >60)
GFR calc non Af Amer: >60 mL/min (ref >60)

## 2018-10-04 MED ORDER — SUMATRIPTAN SUCCINATE 50 MG PO TABS
100.0000 mg | ORAL_TABLET | ORAL | Status: DC | PRN
  Filled 2018-10-04 (×2): qty 2

## 2018-10-04 MED ORDER — FOLIC ACID 1 MG PO TABS: 1.0000 mg | ORAL_TABLET | Freq: Every day | ORAL | 0 refills | Status: AC

## 2018-10-04 MED ORDER — CLOBAZAM 10 MG PO TABS: 10.0000 mg | ORAL_TABLET | Freq: Every day | ORAL | 0 refills | Status: DC

## 2018-10-04 MED ORDER — CYANOCOBALAMIN 1000 MCG PO TABS: 1000.0000 ug | ORAL_TABLET | Freq: Every day | ORAL | 0 refills | Status: DC

## 2018-10-04 NOTE — Progress Notes (Signed)
[facility] at [place] NAME: Kim [name]    MR#:  [id number]  DATE OF BIRTH:  03-05-1966  SUBJECTIVE:  CHIEF COMPLAINT:  No chief complaint on file.  Confused intermittently  REVIEW OF SYSTEMS:   Review of Systems  Constitutional: Positive for malaise/fatigue. Negative for fever.  HENT: Negative for congestion, ear pain and hearing loss.   Eyes: Negative for blurred vision and double vision.  Respiratory: Negative for cough, sputum production and shortness of breath.   Cardiovascular: Negative for chest pain, palpitations and orthopnea.  Gastrointestinal: Positive for diarrhea. Negative for abdominal pain, nausea and vomiting.  Genitourinary: Negative for dysuria, frequency and urgency.  Musculoskeletal: Negative for back pain and myalgias.  Skin: Negative for rash.  Neurological: Negative for dizziness, sensory change and focal weakness.  Psychiatric/Behavioral: Negative for depression.    DRUG ALLERGIES:   Allergies  Allergen Reactions  . Ezogabine Other (See Comments)    Unknown to patient  . Lacosamide Other (See Comments)    Unknown  . Levetiracetam Other (See Comments)    unknown  . Other Other (See Comments)    unknown unknown  . Perampanel Other (See Comments)    Unknown to patient  . Prednisone Other (See Comments)    unknown  . Tizanidine Other (See Comments)    unknown    VITALS:  Blood pressure 126/85, pulse 83, temperature 98.3 F (36.8 C), temperature source Oral, resp. rate 16, height 5\' 6"  (1.676 m), weight 69.1 kg, SpO2 98 %.  PHYSICAL EXAMINATION:   GENERAL:  53 y.o.-year-old patientsitting in the bed with no acute distress.  EYES: Pupils equal, round, reactive to light and accommodation. No scleral icterus. Extraocular muscles intact.  HEENT: Head atraumatic, normocephalic. Oropharynx and nasopharynx clear. No oropharyngeal erythema, moist oral mucosa  NECK:  Supple, no jugular venous distention. No  thyroid enlargement, no tenderness.  LUNGS: Normal breath sounds bilaterally, no wheezing, rales, rhonchi. No use of accessory muscles of respiration.  CARDIOVASCULAR: S1, S2 normal. No murmurs, rubs, or gallops.  ABDOMEN: Soft, nontender, nondistended. Bowel sounds present. No organomegaly or mass.  EXTREMITIES: No pedal edema, cyanosis, or clubbing. + 2 pedal & radial pulses b/l.   NEUROLOGIC: Following instructions but moves all 4 extremities, alert and oriented X3.  Have minimal tremors. PSYCHIATRIC: The patient is  alert and oriented x3.  SKIN: No obvious rash, lesion, or ulcer.   Physical Exam LABORATORY PANEL:   CBC Recent Labs  Lab 10/03/18 0429  WBC 4.1  HGB 11.5*  HCT 34.5*  PLT 78*   ------------------------------------------------------------------------------------------------------------------  Chemistries  Recent Labs  Lab 10/02/18 0422 10/03/18 0429 10/04/18 0514  NA 141 144  --   K 3.2* 3.9  --   CL 115* 117*  --   CO2 20* 20*  --   GLUCOSE 78 84  --   BUN 6 8  --   CREATININE 0.80 0.78 0.72  CALCIUM 8.0* 8.6*  --   AST 19  --   --   ALT 13  --   --   ALKPHOS 48  --   --   BILITOT 0.7  --   --    ------------------------------------------------------------------------------------------------------------------  Cardiac Enzymes No results for input(s): TROPONINI in the last 168 hours. ------------------------------------------------------------------------------------------------------------------  RADIOLOGY:  No results found.  ASSESSMENT AND PLAN:   Active Problems:   Valproic acid toxicity  *Valproic acid toxicity with acute toxic encephalopathy - resolved   Levels are down. Stopped  depakote  * Ammonia is elevated- due to depakote. Treding down Stop lactulose No cirrhosis  * Epilepsy Depakote stopped. Continue Topamax and Trileptal, Onfi  *Depression.  Management per psychiatry team. Appreciate help by psychiatric team.  *   DVT prophylaxis with Lovenox  Continues to be confused at times and weak. Lives at home alone. Husband unable to be there at all times. Will consult SW for placement.  Discussed with psychiatry and no need for BHU  All the records are reviewed and case discussed with Care Management/Social Workerr. Management plans discussed with the patient, family and they are in agreement.  CODE STATUS: Full.  TOTAL TIME TAKING CARE OF THIS PATIENT: 35 minutes.   POSSIBLE D/C IN 1-2 DAYS, DEPENDING ON CLINICAL CONDITION.   Neita Carp M.D on 10/04/2018   Between 7am to 6pm - Pager - [phone]  After 6pm go to [email or web address] - password [email or web address] [facility]  Office  [phone]  CC: Primary care physician; Patient, No Pcp Per  Note: This dictation was prepared with Dragon dictation along with smaller phrase technology. Any transcriptional errors that result from this process are unintentional.

## 2018-10-04 NOTE — Discharge Instructions (Addendum)
Resume diet and activity as before ° ° °

## 2018-10-04 NOTE — Consult Note (Signed)
[facility] Face-to-Face Psychiatry Consult   Reason for Consult: History of bipolar disorder, history of PTSD, confusion, orientation Referring Physician:  Anselm Jungling Patient Identification: Kim [name] MRN:  [id number] Principal Diagnosis: Valproic acid toxicity Diagnosis:  Active Problems:   Valproic acid toxicity   Total Time spent with patient: 15 minutes  Subjective:    Identifying information Kim [name] is a 53 y.o. female patient initially admitted to the behavioral health unit for management of her bipolar disorder, PTSD and behavioral disturbances, she was transferred to the medical service due to suspected Depakote toxicity.  Her valproic acid on September 27, 2018 was 183 (noted that this was a 12-hour level instead of a 24-hour trough level) she has since been tapered off of Depakote, and is currently on Topamax Trileptal and Onfi control and low-dose Seroquel at night.   HPI:  Patient is seen and examined morning. She was able to review her recent medical history and discuss her psychiatric symptoms.  She reported that she had been poorly compliant with her home medications roughly 2 to 3 weeks prior to coming to the hospital, and reported that she had started taking them inappropriately because she wanted to lose weight. Since her transfer to the medical service she reports that her mood has improved "a lot", she denies any feelings of suicidality or homicidality.  She denies any neurovegetative symptoms related to depression other than feeling that she has been sleeping more often lately which may be due to medications.  She did not display or endorse symptoms related to a current manic episode, she denies any AVH at this time.  No delusional content was detected during our discussion.  She stated her goals for today were to "try to get home".  Past Psychiatric History: Patient carries a differential diagnosis of bipolar disorder versus major depression.  She has had multiple psychiatric  admissions in the past.  Previous psychiatric medications include Rexulti, fluoxetine, Trileptal, trazodone.  Past Medical History:  Past Medical History:  Diagnosis Date  . Depression   . Migraines   . Neck pain   . Seizures ([place])     Past Surgical History:  Procedure Laterality Date  . IMPLANTATION VAGAL NERVE STIMULATOR     2001, 2008, 2014   Family History:  Family History  Problem Relation Age of Onset  . Hyperlipidemia Mother   . Hypertension Mother   . Hyperlipidemia Father   . Hypertension Father   . Lung cancer Father   . Diabetes Father   . Heart disease Father    Family Psychiatric  History: Noncontributory Social History:  Social History   Substance and Sexual Activity  Alcohol Use Not Currently  . Alcohol/week: 0.0 standard drinks   Comment: No drinking in six weeks.  She was previously drinking daily. Recent completion of rehab.     Social History   Substance and Sexual Activity  Drug Use Yes  . Frequency: 3.0 times per week  . Types: Marijuana   Comment: Stop smoking marijuana six weeks ago.  Previously smoking daily.  Recent completion of rehab.    Social History   Socioeconomic History  . Marital status: Single    Spouse name: Not on file  . Number of children: 2  . Years of education: GED  . Highest education level: Not on file  Occupational History  . Occupation: Disabled.  Social Needs  . Financial resource strain: Not on file  . Food insecurity:    Worry: Not on file  Inability: Not on file  . Transportation needs:    Medical: Not on file    Non-medical: Not on file  Tobacco Use  . Smoking status: Former Research scientist (life sciences)  . Smokeless tobacco: Never Used  . Tobacco comment: Quit 2001  Substance and Sexual Activity  . Alcohol use: Not Currently    Alcohol/week: 0.0 standard drinks    Comment: No drinking in six weeks.  She was previously drinking daily. Recent completion of rehab.  . Drug use: Yes    Frequency: 3.0 times per week     Types: Marijuana    Comment: Stop smoking marijuana six weeks ago.  Previously smoking daily.  Recent completion of rehab.  . Sexual activity: Not on file  Lifestyle  . Physical activity:    Days per week: Not on file    Minutes per session: Not on file  . Stress: Not on file  Relationships  . Social connections:    Talks on phone: Patient refused    Gets together: Patient refused    Attends religious service: Patient refused    Active member of club or organization: Patient refused    Attends meetings of clubs or organizations: Patient refused    Relationship status: Patient refused  Other Topics Concern  . Not on file  Social History Narrative   Lives at home with husband.   2-3 cups caffeine daily.   Right-handed.   Additional Social History:    Allergies:   Allergies  Allergen Reactions  . Ezogabine Other (See Comments)    Unknown to patient  . Lacosamide Other (See Comments)    Unknown  . Levetiracetam Other (See Comments)    unknown  . Other Other (See Comments)    unknown unknown  . Perampanel Other (See Comments)    Unknown to patient  . Prednisone Other (See Comments)    unknown  . Tizanidine Other (See Comments)    unknown    Labs:  Results for orders placed or performed during the hospital encounter of 09/27/18 (from the past 48 hour(s))  Ammonia     Status: Abnormal   Collection Time: 10/03/18  4:29 AM  Result Value Ref Range   Ammonia 62 (H) 9 - 35 umol/L    Comment: Performed at [facility], [address] [place], [place] [address]  CBC     Status: Abnormal   Collection Time: 10/03/18  4:29 AM  Result Value Ref Range   WBC 4.1 4.0 - 10.5 K/uL   RBC 3.51 (L) 3.87 - 5.11 MIL/uL   Hemoglobin 11.5 (L) 12.0 - 15.0 g/dL   HCT 34.5 (L) 36.0 - 46.0 %   MCV 98.3 80.0 - 100.0 fL   MCH 32.8 26.0 - 34.0 pg   MCHC 33.3 30.0 - 36.0 g/dL   RDW 15.2 11.5 - 15.5 %   Platelets 78 (L) 150 - 400 K/uL    Comment: Immature Platelet Fraction may  be clinically indicated, consider ordering this additional test [id number]    nRBC 0.0 0.0 - 0.2 %    Comment: Performed at [facility], [address], [place], [place] [address]  Basic metabolic panel     Status: Abnormal   Collection Time: 10/03/18  4:29 AM  Result Value Ref Range   Sodium 144 135 - 145 mmol/L   Potassium 3.9 3.5 - 5.1 mmol/L   Chloride 117 (H) 98 - 111 mmol/L   CO2 20 (L) 22 - 32 mmol/L   Glucose,  Bld 84 70 - 99 mg/dL   BUN 8 6 - 20 mg/dL   Creatinine, Ser 0.78 0.44 - 1.00 mg/dL   Calcium 8.6 (L) 8.9 - 10.3 mg/dL   GFR calc non Af Amer >60 >60 mL/min   GFR calc Af Amer >60 >60 mL/min   Anion gap 7 5 - 15    Comment: Performed at [facility], [address] [place], [place] [address]  Creatinine, serum     Status: None   Collection Time: 10/04/18  5:14 AM  Result Value Ref Range   Creatinine, Ser 0.72 0.44 - 1.00 mg/dL   GFR calc non Af Amer >60 >60 mL/min   GFR calc Af Amer >60 >60 mL/min    Comment: Performed at [facility], [address], [place], [place] [address]    Current Facility-Administered Medications  Medication Dose Route Frequency Provider Last Rate Last Dose  . acetaminophen (TYLENOL) tablet 650 mg  650 mg Oral Q6H PRN Hillary Bow, MD   650 mg at 10/03/18 2115   Or  . acetaminophen (TYLENOL) suppository 650 mg  650 mg Rectal Q6H PRN Sudini, Alveta Heimlich, MD      . albuterol (PROVENTIL) (2.5 MG/3ML) 0.083% nebulizer solution 2.5 mg  2.5 mg Nebulization Q2H PRN Sudini, Srikar, MD      . cloBAZam (ONFI) tablet 10 mg  10 mg Oral Daily Aroor, Lanice Schwab, MD   10 mg at 10/04/18 0952  . enoxaparin (LOVENOX) injection 40 mg  40 mg Subcutaneous Q24H Hillary Bow, MD   40 mg at 10/03/18 1703  . feeding supplement (PRO-STAT SUGAR FREE 64) liquid 30 mL  30 mL Oral TID WC [name], MD   30 mL at 10/04/18 0956  . folic acid (FOLVITE) tablet 1 mg  1 mg Oral Daily [name], MD   1 mg at 10/04/18  0952  . lip balm (BLISTEX) ointment   Topical PRN [name], MD      . ondansetron (ZOFRAN) tablet 4 mg  4 mg Oral Q6H PRN Hillary Bow, MD       Or  . ondansetron (ZOFRAN) injection 4 mg  4 mg Intravenous Q6H PRN Sudini, Alveta Heimlich, MD      . OXcarbazepine (TRILEPTAL) tablet 450 mg  450 mg Oral BID Hillary Bow, MD   450 mg at 10/04/18 0952  . pantoprazole (PROTONIX) EC tablet 40 mg  40 mg Oral Daily Hillary Bow, MD   40 mg at 10/04/18 0952  . polyethylene glycol (MIRALAX / GLYCOLAX) packet 17 g  17 g Oral Daily PRN Sudini, Alveta Heimlich, MD      . QUEtiapine (SEROQUEL) tablet 50 mg  50 mg Oral QHS PRN Sharma Covert, MD      . rifaximin Doreene Nest) tablet 550 mg  550 mg Oral BID [name], MD   550 mg at 10/04/18 0952  . sodium chloride flush (NS) 0.9 % injection 3 mL  3 mL Intravenous Q12H Hillary Bow, MD   3 mL at 10/04/18 0953  . thiamine (VITAMIN B-1) tablet 100 mg  100 mg Oral Daily [name], MD   100 mg at 10/04/18 0952  . topiramate (TOPAMAX) tablet 150 mg  150 mg Oral BID Hillary Bow, MD   150 mg at 10/04/18 0952  . vitamin B-12 (CYANOCOBALAMIN) tablet 1,000 mcg  1,000 mcg Oral Daily [name], MD   1,000 mcg at 10/04/18 6073    Musculoskeletal: Strength & Muscle Tone: within normal limits Gait &  Station: Lying in a hospital bed Patient leans: N/A  Psychiatric Specialty Exam: Physical Exam  Nursing note and vitals reviewed. Constitutional: She is oriented to person, place, and time. She appears well-developed and well-nourished.  HENT:  Head: Normocephalic and atraumatic.  Respiratory: Effort normal.  Neurological: She is alert and oriented to person, place, and time.    Review of Systems  Psychiatric/Behavioral: Negative for depression, hallucinations and suicidal ideas.    Blood pressure 126/85, pulse 83, temperature 98.3 F (36.8 C), temperature source Oral, resp. rate 16, height 5\' 6"  (1.676 m), weight 69.1 kg,  SpO2 98 %.Body mass index is 24.59 kg/m.  General Appearance: Fairly Groomed  Eye Contact:  Good  Speech:  Clear and Coherent and Normal Rate  Volume:  Normal  Mood:  "Pretty good"  Affect:  Constricted  Thought Process: Linear and logical  Orientation:  Full (Time, Place, and Person)  Thought Content:  Devoid of AVH or delusional material  Suicidal Thoughts:  No  Homicidal Thoughts:  No  Memory:  Immediate;   Fair Recent;   Fair Remote;   Fair  Judgement:  Intact  Insight:  Fair  Psychomotor Activity:  Slight psychomotor retardation  Concentration:  Concentration: Fair and Attention Span: Fair  Recall:  [employer] of Knowledge:  Fair  Language:  Fair  Akathisia:  Negative  Handed:  Right  AIMS (if indicated):     Assets:  Desire for Improvement Resilience  ADL's:  Intact  Cognition:  WNL  Sleep:        Treatment Plan Summary:  -After reviewing her chart she appears to have returned back to, if not improved upon, the point where she was at when she was capable of discharge from the inpatient psychiatric unit.  She is currently denying criteria that would suggest that she is an imminent threat to herself or others. She [facility] with [facility] and Dr. Linward Headland for her neurological needs. Would feel comfortable with her discharging home (once she is medically cleared) on her current medications and following up with outpatient psychiatric services.  Disposition: No evidence of imminent risk to self or others at present.   Patient does not meet criteria for psychiatric inpatient admission.  Chong Sicilian, DO 10/04/2018 11:06 AM

## 2018-10-05 NOTE — Clinical Social Work Note (Signed)
CSW informed by the physician that patient is to discharge today. CSW spoke with patient today regarding home health versus skilled rehab placement. Patient adamantly refused skilled rehab placement but was open to having home health. Patient initially was thought to reside in [facility] however, she resides in [place]. After uploading the agencies on medicare.gov that service her area, the home health agency that was able to be found that could staff seeing the patient was [facility]. CSW spoke with Tommi Rumps at [place] and he is giving the referral to their [facility] office. Patient was alert and oriented X4 this morning. She had no preference of home health agency.  Shela Leff MSW,LCSW [phone]

## 2018-10-05 NOTE — Care Management Important Message (Signed)
Important Message  Patient Details  Name: Kim [name] MRN: [id number] Date of Birth: 1965-12-06   Medicare Important Message Given:  Yes    Dannette Barbara 10/05/2018, 11:12 AM

## 2018-10-05 NOTE — TOC Transition Note (Signed)
Transition of Care [facility]) - CM/SW Discharge Note   Patient Details  Name: Kim [name] MRN: [id number] Date of Birth: 1965/11/02  Transition of Care [facility]) CM/SW Contact:  Shela Leff, LCSW Phone Number: 10/05/2018, 3:36 PM   Clinical Narrative:   CSW informed by the physician that patient is to discharge today. CSW spoke with patient today regarding home health versus skilled rehab placement. Patient adamantly refused skilled rehab placement but was open to having home health. Patient initially was thought to reside in [facility] however, she resides in [place]. After uploading the agencies on medicare.gov that service her area, the home health agency that was able to be found that could staff seeing the patient was [facility]. CSW spoke with Tommi Rumps at [place] and he is giving the referral to their [facility] office. Patient was alert and oriented X4 this morning. She had no preference of home health agency.  Shela Leff MSW,LCSW [phone]    Final next level of care: Home w Home Health Services Barriers to Discharge: No Barriers Identified   Patient Goals and CMS Choice   CMS Medicare.gov Compare Post Acute Care list provided to:: Patient Choice offered to / list presented to : Patient  Discharge Placement                       Discharge Plan and Services                    HH Arranged: RN, PT, Social Work [employer] Agency: [facility]   Social Determinants of Health (SDOH) Interventions     Readmission Risk Interventions No flowsheet data found.

## 2018-10-05 NOTE — Progress Notes (Signed)
Kim [name] to be D/C'd Home per MD order.  Discussed prescriptions and follow up appointments with the patient. Prescriptions given to patient, medication list explained in detail. Pt verbalized understanding.  Allergies as of 10/05/2018      Reactions   Ezogabine Other (See Comments)   Unknown to patient   Lacosamide Other (See Comments)   Unknown   Levetiracetam Other (See Comments)   unknown   Other Other (See Comments)   unknown unknown   Perampanel Other (See Comments)   Unknown to patient   Prednisone Other (See Comments)   unknown   Tizanidine Other (See Comments)   unknown      Medication List    STOP taking these medications   esomeprazole 40 MG capsule Commonly known as:  NEXIUM   lisinopril 10 MG tablet Commonly known as:  ZESTRIL     TAKE these medications   acetaminophen 325 MG tablet Commonly known as:  TYLENOL Take 2 tablets (650 mg total) by mouth every 6 (six) hours as needed for mild pain.   Ajovy 225 MG/1.5ML Soaj Generic drug:  Fremanezumab-vfrm Inject 225 mg into the skin every 30 (thirty) days.   cloBAZam 10 MG tablet Commonly known as:  ONFI Take 1 tablet (10 mg total) by mouth daily.   cyanocobalamin 1000 MCG tablet Take 1 tablet (1,000 mcg total) by mouth daily.   FLUoxetine 20 MG capsule Commonly known as:  PROZAC Take 20 mg by mouth daily.   folic acid 1 MG tablet Commonly known as:  FOLVITE Take 1 tablet (1 mg total) by mouth daily.   OXcarbazepine 150 MG tablet Commonly known as:  TRILEPTAL Take 3 tablets (450 mg total) by mouth 2 (two) times daily for 30 days.   pantoprazole 40 MG tablet Commonly known as:  PROTONIX Take 1 tablet (40 mg total) by mouth daily.   QUEtiapine 50 MG tablet Commonly known as:  SEROQUEL Take 1 tablet (50 mg total) by mouth at bedtime for 30 days.   Rexulti 1 MG Tabs Generic drug:  Brexpiprazole Take 1 mg by mouth at bedtime.   rOPINIRole 2 MG tablet Commonly known as:  REQUIP Take 2 mg  by mouth at bedtime.   SUMAtriptan 100 MG tablet Commonly known as:  IMITREX Take 1 tablet (100 mg total) by mouth once as needed for migraine. May repeat in 2 hours if headache persists or recurs.   topiramate 50 MG tablet Commonly known as:  TOPAMAX Take 3 tablets (150 mg total) by mouth 2 (two) times daily for 30 days.       Vitals:   10/04/18 1930 10/05/18 0342  BP: 121/71 (!) 141/85  Pulse: 77 81  Resp: 19 18  Temp: 98.1 F (36.7 C) 98.1 F (36.7 C)  SpO2: 98% 97%    Skin clean, dry and intact without evidence of skin break down, no evidence of skin tears noted. IV catheter discontinued intact. Site without signs and symptoms of complications. Dressing and pressure applied. Pt denies pain at this time. No complaints noted.  An After Visit Summary was printed and given to the patient. Patient escorted via [place], and D/C home via private auto.  Fuller Mandril, RN

## 2018-10-07 ENCOUNTER — Telehealth: Payer: Self-pay | Admitting: Neurology

## 2018-10-07 DIAGNOSIS — Z87891 Personal history of nicotine dependence: Secondary | ICD-10-CM | POA: Diagnosis not present

## 2018-10-07 DIAGNOSIS — G40909 Epilepsy, unspecified, not intractable, without status epilepticus: Secondary | ICD-10-CM | POA: Diagnosis not present

## 2018-10-07 DIAGNOSIS — I10 Essential (primary) hypertension: Secondary | ICD-10-CM | POA: Diagnosis not present

## 2018-10-07 DIAGNOSIS — G43909 Migraine, unspecified, not intractable, without status migrainosus: Secondary | ICD-10-CM | POA: Diagnosis not present

## 2018-10-07 DIAGNOSIS — T426X2D Poisoning by other antiepileptic and sedative-hypnotic drugs, intentional self-harm, subsequent encounter: Secondary | ICD-10-CM | POA: Diagnosis not present

## 2018-10-07 DIAGNOSIS — Z9181 History of falling: Secondary | ICD-10-CM | POA: Diagnosis not present

## 2018-10-07 DIAGNOSIS — F319 Bipolar disorder, unspecified: Secondary | ICD-10-CM | POA: Diagnosis not present

## 2018-10-07 DIAGNOSIS — R413 Other amnesia: Secondary | ICD-10-CM | POA: Diagnosis not present

## 2018-10-07 DIAGNOSIS — R260 Ataxic gait: Secondary | ICD-10-CM | POA: Diagnosis not present

## 2018-10-07 NOTE — Telephone Encounter (Signed)
EEG study was signed.

## 2018-10-07 NOTE — Telephone Encounter (Signed)
I spoke to the patient and she is feeling better.  She is resting at home.  She is agreeable to keep her appt on 10/13/2018 with Dr. [name].  She does not have a computer and does not feel comfortable using her phone for a virtual visit.  The appt has been converted to a telephone visit.  The patient was instructed to call back prior to this date with any questions.  Our number was provided to her.

## 2018-10-07 NOTE — Telephone Encounter (Signed)
I failed to reach patient by numbers listed, she was admitted to hospital, with elevated ammonia and Depakote, confusion,    I was able to talk with admitting physician Dr. Hillary Bow,  suggested stoppiong Depakote  Patient was discharged on April 21.  Please keep her on follow up schedule within a week.

## 2018-10-08 DIAGNOSIS — R413 Other amnesia: Secondary | ICD-10-CM | POA: Diagnosis not present

## 2018-10-08 DIAGNOSIS — G40909 Epilepsy, unspecified, not intractable, without status epilepticus: Secondary | ICD-10-CM | POA: Diagnosis not present

## 2018-10-08 DIAGNOSIS — T426X2D Poisoning by other antiepileptic and sedative-hypnotic drugs, intentional self-harm, subsequent encounter: Secondary | ICD-10-CM | POA: Diagnosis not present

## 2018-10-08 DIAGNOSIS — F319 Bipolar disorder, unspecified: Secondary | ICD-10-CM | POA: Diagnosis not present

## 2018-10-08 DIAGNOSIS — I10 Essential (primary) hypertension: Secondary | ICD-10-CM | POA: Diagnosis not present

## 2018-10-08 DIAGNOSIS — R260 Ataxic gait: Secondary | ICD-10-CM | POA: Diagnosis not present

## 2018-10-13 ENCOUNTER — Encounter: Payer: Self-pay | Admitting: Neurology

## 2018-10-13 ENCOUNTER — Ambulatory Visit (INDEPENDENT_AMBULATORY_CARE_PROVIDER_SITE_OTHER): Payer: Medicare Other | Admitting: Neurology

## 2018-10-13 ENCOUNTER — Other Ambulatory Visit: Payer: Self-pay

## 2018-10-13 DIAGNOSIS — T426X1D Poisoning by other antiepileptic and sedative-hypnotic drugs, accidental (unintentional), subsequent encounter: Secondary | ICD-10-CM

## 2018-10-13 DIAGNOSIS — Z79899 Other long term (current) drug therapy: Secondary | ICD-10-CM

## 2018-10-13 DIAGNOSIS — G40209 Localization-related (focal) (partial) symptomatic epilepsy and epileptic syndromes with complex partial seizures, not intractable, without status epilepticus: Secondary | ICD-10-CM | POA: Diagnosis not present

## 2018-10-13 DIAGNOSIS — F3160 Bipolar disorder, current episode mixed, unspecified: Secondary | ICD-10-CM | POA: Diagnosis not present

## 2018-10-13 DIAGNOSIS — G43009 Migraine without aura, not intractable, without status migrainosus: Secondary | ICD-10-CM | POA: Diagnosis not present
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
## 2018-10-14 DIAGNOSIS — F319 Bipolar disorder, unspecified: Secondary | ICD-10-CM | POA: Diagnosis not present

## 2018-10-14 DIAGNOSIS — R260 Ataxic gait: Secondary | ICD-10-CM | POA: Diagnosis not present

## 2018-10-14 DIAGNOSIS — T426X2D Poisoning by other antiepileptic and sedative-hypnotic drugs, intentional self-harm, subsequent encounter: Secondary | ICD-10-CM | POA: Diagnosis not present

## 2018-10-14 DIAGNOSIS — I10 Essential (primary) hypertension: Secondary | ICD-10-CM | POA: Diagnosis not present

## 2018-10-14 DIAGNOSIS — R413 Other amnesia: Secondary | ICD-10-CM | POA: Diagnosis not present

## 2018-10-14 DIAGNOSIS — G40909 Epilepsy, unspecified, not intractable, without status epilepticus: Secondary | ICD-10-CM | POA: Diagnosis not present

## 2018-10-16 DIAGNOSIS — R413 Other amnesia: Secondary | ICD-10-CM | POA: Diagnosis not present

## 2018-10-16 DIAGNOSIS — R260 Ataxic gait: Secondary | ICD-10-CM | POA: Diagnosis not present

## 2018-10-16 DIAGNOSIS — I10 Essential (primary) hypertension: Secondary | ICD-10-CM | POA: Diagnosis not present

## 2018-10-16 DIAGNOSIS — G40909 Epilepsy, unspecified, not intractable, without status epilepticus: Secondary | ICD-10-CM | POA: Diagnosis not present

## 2018-10-16 DIAGNOSIS — T426X2D Poisoning by other antiepileptic and sedative-hypnotic drugs, intentional self-harm, subsequent encounter: Secondary | ICD-10-CM | POA: Diagnosis not present

## 2018-10-16 DIAGNOSIS — F319 Bipolar disorder, unspecified: Secondary | ICD-10-CM | POA: Diagnosis not present

## 2018-10-17 NOTE — Discharge Summary (Signed)
[facility] [place] NAME: Kim [name]    MR#:  [id number]  DATE OF BIRTH:  [birth year]  DATE OF ADMISSION:  09/27/2018 ADMITTING PHYSICIAN: Hillary Bow, MD  DATE OF DISCHARGE: 10/05/2018  1:57 PM  PRIMARY CARE PHYSICIAN: Patient, No Pcp Per   ADMISSION DIAGNOSIS:  depakote toxicity  DISCHARGE DIAGNOSIS:  Active Problems:   Valproic acid toxicity   SECONDARY DIAGNOSIS:   Past Medical History:  Diagnosis Date  . Depression   . Migraines   . Neck pain   . Seizures ([place])      ADMITTING HISTORY  HISTORY OF PRESENT ILLNESS:  Kim [name]  is a 53 y.o. female with a known history of seizures, migraines, depression admitted to behavioral health unit continues to get worse with her symptoms of ataxia, confusion, drowsiness.  Poor oral intake.  Patient on admission was found to have Depakote levels greater than 132.  Today this was repeated and found to be at 183.  Patient will be admitted to telemetry monitored unit for valproic acid toxicity.  Patient does not have any acute overdose.  This is a chronic medication of 1500 mg daily.  No recent change in dose.  She is also on other sedative medications. Unable to get history from patient. Patient was able to ambulate in the hallway earlier but was holding onto the walls.  At this time she is wanting to sleep but not able to provide history  HOSPITAL COURSE:   *Valproic acid toxicity with acute toxic encephalopathy - resolved  Levels are down. Stopped depakote  * Ammonia is elevated- due to depakote. Treding down Stopped lactulose No cirrhosis  * Epilepsy Depakote stopped. Continue Topamax and Trileptal, Onfi Case was discussed by neurologist Dr. Lorraine Lax with her outpatient neurologist  Dr. [name]  *Depression. Management per psychiatry team. Appreciate help by psychiatric team.  *  DVT prophylaxis with Lovenox in the hospital  Patient is much improved and close to her  baseline.  Discussed with husband on day of discharge.  Home health set up at discharge.  CONSULTS OBTAINED:    Neurology, psychiatry  DRUG ALLERGIES:   Allergies  Allergen Reactions  . Ezogabine Other (See Comments)    Unknown to patient  . Lacosamide Other (See Comments)    Unknown  . Levetiracetam Other (See Comments)    unknown  . Other Other (See Comments)    unknown unknown  . Perampanel Other (See Comments)    Unknown to patient  . Prednisone Other (See Comments)    unknown  . Tizanidine Other (See Comments)    unknown    DISCHARGE MEDICATIONS:   Allergies as of 10/05/2018      Reactions   Ezogabine Other (See Comments)   Unknown to patient   Lacosamide Other (See Comments)   Unknown   Levetiracetam Other (See Comments)   unknown   Other Other (See Comments)   unknown unknown   Perampanel Other (See Comments)   Unknown to patient   Prednisone Other (See Comments)   unknown   Tizanidine Other (See Comments)   unknown      Medication List    STOP taking these medications   esomeprazole 40 MG capsule Commonly known as:  NEXIUM   lisinopril 10 MG tablet Commonly known as:  ZESTRIL     TAKE these medications   acetaminophen 325 MG tablet Commonly known as:  TYLENOL Take 2 tablets (650 mg total) by mouth every 6 (  six) hours as needed for mild pain.   Ajovy 225 MG/1.5ML Soaj Generic drug:  Fremanezumab-vfrm Inject 225 mg into the skin every 30 (thirty) days.   cloBAZam 10 MG tablet Commonly known as:  ONFI Take 1 tablet (10 mg total) by mouth daily.   cyanocobalamin 1000 MCG tablet Take 1 tablet (1,000 mcg total) by mouth daily.   FLUoxetine 20 MG capsule Commonly known as:  PROZAC Take 20 mg by mouth daily.   folic acid 1 MG tablet Commonly known as:  FOLVITE Take 1 tablet (1 mg total) by mouth daily.   OXcarbazepine 150 MG tablet Commonly known as:  TRILEPTAL Take 3 tablets (450 mg total) by mouth 2 (two) times daily for 30 days.    pantoprazole 40 MG tablet Commonly known as:  PROTONIX Take 1 tablet (40 mg total) by mouth daily.   QUEtiapine 50 MG tablet Commonly known as:  SEROQUEL Take 1 tablet (50 mg total) by mouth at bedtime for 30 days.   Rexulti 1 MG Tabs Generic drug:  Brexpiprazole Take 1 mg by mouth at bedtime.   rOPINIRole 2 MG tablet Commonly known as:  REQUIP Take 2 mg by mouth at bedtime.   SUMAtriptan 100 MG tablet Commonly known as:  IMITREX Take 1 tablet (100 mg total) by mouth once as needed for migraine. May repeat in 2 hours if headache persists or recurs.   topiramate 50 MG tablet Commonly known as:  TOPAMAX Take 3 tablets (150 mg total) by mouth 2 (two) times daily for 30 days.       Today   VITAL SIGNS:  Blood pressure (!) 141/85, pulse 81, temperature 98.1 F (36.7 C), temperature source Oral, resp. rate 18, height 5\' 6"  (1.676 m), weight 69.1 kg, SpO2 97 %.  I/O:  No intake or output data in the 24 hours ending 10/17/18 1015  PHYSICAL EXAMINATION:  Physical Exam  GENERAL:  53 y.o.-year-old patient lying in the bed with no acute distress.  LUNGS: Normal breath sounds bilaterally, no wheezing, rales,rhonchi or crepitation. No use of accessory muscles of respiration.  CARDIOVASCULAR: S1, S2 normal. No murmurs, rubs, or gallops.  ABDOMEN: Soft, non-tender, non-distended. Bowel sounds present. No organomegaly or mass.  NEUROLOGIC: Moves all 4 extremities. PSYCHIATRIC: The patient is alert and oriented x 3.  SKIN: No obvious rash, lesion, or ulcer.   DATA REVIEW:   CBC No results for input(s): WBC, HGB, HCT, PLT in the last 168 hours.  Chemistries  No results for input(s): NA, K, CL, CO2, GLUCOSE, BUN, CREATININE, CALCIUM, MG, AST, ALT, ALKPHOS, BILITOT in the last 168 hours.  Invalid input(s): GFRCGP  Cardiac Enzymes No results for input(s): TROPONINI in the last 168 hours.  Microbiology Results  No results found for this or any previous visit.  RADIOLOGY:   No results found.  Follow up with PCP in 1 week.  Management plans discussed with the patient, family and they are in agreement.  CODE STATUS:  Code Status History    Date Active Date Inactive Code Status Order ID Comments User Context   09/27/2018 1655 10/05/2018 1702 Full Code [id number]  Hillary Bow, MD Inpatient   09/23/2018 0621 09/27/2018 1654 Full Code [id number]  Lavella Hammock, MD Inpatient   10/09/2016 0243 10/09/2016 2019 Full Code [id number]  Dalia Heading, PA-C ED   12/10/2014 1522 12/13/2014 1918 Full Code [id number]  Rankin, Mercy Moore, NP Inpatient      TOTAL TIME TAKING CARE OF THIS PATIENT ON DAY  OF DISCHARGE: more than 30 minutes.   Leia Alf Harriette Tovey M.D on 10/17/2018 at 10:15 AM  Between 7am to 6pm - Pager - [phone]  After 6pm go to [email or web address] - password [email or web address]  Office  [phone]  CC: Primary care physician; Patient, No Pcp Per  Note: This dictation was prepared with Dragon dictation along with smaller phrase technology. Any transcriptional errors that result from this process are unintentional.

## 2018-10-21 ENCOUNTER — Other Ambulatory Visit: Payer: Self-pay | Admitting: Neurology

## 2018-10-21 DIAGNOSIS — T426X2D Poisoning by other antiepileptic and sedative-hypnotic drugs, intentional self-harm, subsequent encounter: Secondary | ICD-10-CM | POA: Diagnosis not present

## 2018-10-21 DIAGNOSIS — R413 Other amnesia: Secondary | ICD-10-CM | POA: Diagnosis not present

## 2018-10-21 DIAGNOSIS — F319 Bipolar disorder, unspecified: Secondary | ICD-10-CM | POA: Diagnosis not present

## 2018-10-21 DIAGNOSIS — G40909 Epilepsy, unspecified, not intractable, without status epilepticus: Secondary | ICD-10-CM | POA: Diagnosis not present

## 2018-10-21 DIAGNOSIS — I10 Essential (primary) hypertension: Secondary | ICD-10-CM | POA: Diagnosis not present

## 2018-10-21 DIAGNOSIS — R260 Ataxic gait: Secondary | ICD-10-CM | POA: Diagnosis not present

## 2018-10-22 DIAGNOSIS — T426X2D Poisoning by other antiepileptic and sedative-hypnotic drugs, intentional self-harm, subsequent encounter: Secondary | ICD-10-CM | POA: Diagnosis not present

## 2018-10-22 DIAGNOSIS — R413 Other amnesia: Secondary | ICD-10-CM | POA: Diagnosis not present

## 2018-10-22 DIAGNOSIS — F319 Bipolar disorder, unspecified: Secondary | ICD-10-CM | POA: Diagnosis not present

## 2018-10-22 DIAGNOSIS — R260 Ataxic gait: Secondary | ICD-10-CM | POA: Diagnosis not present

## 2018-10-22 DIAGNOSIS — I10 Essential (primary) hypertension: Secondary | ICD-10-CM | POA: Diagnosis not present

## 2018-10-22 DIAGNOSIS — G40909 Epilepsy, unspecified, not intractable, without status epilepticus: Secondary | ICD-10-CM | POA: Diagnosis not present

## 2018-10-23 DIAGNOSIS — F319 Bipolar disorder, unspecified: Secondary | ICD-10-CM | POA: Diagnosis not present

## 2018-10-23 DIAGNOSIS — G2581 Restless legs syndrome: Secondary | ICD-10-CM | POA: Diagnosis not present

## 2018-10-23 DIAGNOSIS — G40909 Epilepsy, unspecified, not intractable, without status epilepticus: Secondary | ICD-10-CM | POA: Diagnosis not present

## 2018-10-23 DIAGNOSIS — I10 Essential (primary) hypertension: Secondary | ICD-10-CM | POA: Diagnosis not present

## 2018-11-06 DIAGNOSIS — T426X2D Poisoning by other antiepileptic and sedative-hypnotic drugs, intentional self-harm, subsequent encounter: Secondary | ICD-10-CM | POA: Diagnosis not present

## 2018-11-06 DIAGNOSIS — G40909 Epilepsy, unspecified, not intractable, without status epilepticus: Secondary | ICD-10-CM | POA: Diagnosis not present

## 2018-11-06 DIAGNOSIS — R260 Ataxic gait: Secondary | ICD-10-CM | POA: Diagnosis not present

## 2018-11-06 DIAGNOSIS — I10 Essential (primary) hypertension: Secondary | ICD-10-CM | POA: Diagnosis not present

## 2018-11-06 DIAGNOSIS — F319 Bipolar disorder, unspecified: Secondary | ICD-10-CM | POA: Diagnosis not present

## 2018-11-06 DIAGNOSIS — Z87891 Personal history of nicotine dependence: Secondary | ICD-10-CM | POA: Diagnosis not present

## 2018-11-06 DIAGNOSIS — G43909 Migraine, unspecified, not intractable, without status migrainosus: Secondary | ICD-10-CM | POA: Diagnosis not present

## 2018-11-06 DIAGNOSIS — Z9181 History of falling: Secondary | ICD-10-CM | POA: Diagnosis not present

## 2018-11-06 DIAGNOSIS — R413 Other amnesia: Secondary | ICD-10-CM | POA: Diagnosis not present

## 2018-11-11 DIAGNOSIS — R260 Ataxic gait: Secondary | ICD-10-CM | POA: Diagnosis not present

## 2018-11-11 DIAGNOSIS — R413 Other amnesia: Secondary | ICD-10-CM | POA: Diagnosis not present

## 2018-11-11 DIAGNOSIS — F319 Bipolar disorder, unspecified: Secondary | ICD-10-CM | POA: Diagnosis not present

## 2018-11-11 DIAGNOSIS — T426X2D Poisoning by other antiepileptic and sedative-hypnotic drugs, intentional self-harm, subsequent encounter: Secondary | ICD-10-CM | POA: Diagnosis not present

## 2018-11-11 DIAGNOSIS — G40909 Epilepsy, unspecified, not intractable, without status epilepticus: Secondary | ICD-10-CM | POA: Diagnosis not present

## 2018-11-11 DIAGNOSIS — I10 Essential (primary) hypertension: Secondary | ICD-10-CM | POA: Diagnosis not present

## 2018-11-13 DIAGNOSIS — G2581 Restless legs syndrome: Secondary | ICD-10-CM | POA: Diagnosis not present

## 2018-11-13 DIAGNOSIS — T464X5A Adverse effect of angiotensin-converting-enzyme inhibitors, initial encounter: Secondary | ICD-10-CM | POA: Diagnosis not present

## 2018-11-13 DIAGNOSIS — I10 Essential (primary) hypertension: Secondary | ICD-10-CM | POA: Diagnosis not present

## 2018-11-13 DIAGNOSIS — K219 Gastro-esophageal reflux disease without esophagitis: Secondary | ICD-10-CM | POA: Diagnosis not present

## 2018-12-02 DIAGNOSIS — T426X2D Poisoning by other antiepileptic and sedative-hypnotic drugs, intentional self-harm, subsequent encounter: Secondary | ICD-10-CM | POA: Diagnosis not present

## 2018-12-02 DIAGNOSIS — R413 Other amnesia: Secondary | ICD-10-CM | POA: Diagnosis not present

## 2018-12-02 DIAGNOSIS — R260 Ataxic gait: Secondary | ICD-10-CM | POA: Diagnosis not present

## 2018-12-02 DIAGNOSIS — G40909 Epilepsy, unspecified, not intractable, without status epilepticus: Secondary | ICD-10-CM | POA: Diagnosis not present

## 2018-12-02 DIAGNOSIS — F319 Bipolar disorder, unspecified: Secondary | ICD-10-CM | POA: Diagnosis not present

## 2018-12-02 DIAGNOSIS — I10 Essential (primary) hypertension: Secondary | ICD-10-CM | POA: Diagnosis not present

## 2018-12-11 DIAGNOSIS — T464X5A Adverse effect of angiotensin-converting-enzyme inhibitors, initial encounter: Secondary | ICD-10-CM | POA: Diagnosis not present

## 2018-12-11 DIAGNOSIS — R05 Cough: Secondary | ICD-10-CM | POA: Diagnosis not present

## 2018-12-11 DIAGNOSIS — I10 Essential (primary) hypertension: Secondary | ICD-10-CM | POA: Diagnosis not present

## 2019-01-11 ENCOUNTER — Telehealth: Payer: Self-pay | Admitting: *Deleted

## 2019-01-11 NOTE — Telephone Encounter (Signed)
Kim [name], VNS rep, is aware and will be present for the appointment.

## 2019-01-11 NOTE — Telephone Encounter (Signed)
It would better for Jinny Blossom to be here for August 7th follow up visit

## 2019-01-11 NOTE — Telephone Encounter (Signed)
Left message requesting her to call our office to schedule a follow up with Dr. [name].  She needs to check her VNS.

## 2019-01-11 NOTE — Telephone Encounter (Signed)
Received call from VNS rep, Rolly Pancake, who states patient's generator my be nearing end of service with less than six months of battery life left.  The patient's husband has to drive her to the appts.  He needs a late afternoon time.  She has been scheduled on 01/22/2019.

## 2019-01-21 ENCOUNTER — Encounter: Payer: Self-pay | Admitting: Neurology

## 2019-01-21 ENCOUNTER — Ambulatory Visit (INDEPENDENT_AMBULATORY_CARE_PROVIDER_SITE_OTHER): Payer: Medicare Other | Admitting: Neurology

## 2019-01-21 ENCOUNTER — Other Ambulatory Visit: Payer: Self-pay

## 2019-01-21 VITALS — BP 129/79 | HR 77 | Temp 98.6°F | Wt 165.0 lb

## 2019-01-21 DIAGNOSIS — G40219 Localization-related (focal) (partial) symptomatic epilepsy and epileptic syndromes with complex partial seizures, intractable, without status epilepticus: Secondary | ICD-10-CM

## 2019-01-21 DIAGNOSIS — G43009 Migraine without aura, not intractable, without status migrainosus: Secondary | ICD-10-CM

## 2019-01-21 DIAGNOSIS — G40209 Localization-related (focal) (partial) symptomatic epilepsy and epileptic syndromes with complex partial seizures, not intractable, without status epilepticus: Secondary | ICD-10-CM | POA: Diagnosis not present
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
## 2019-01-29 NOTE — Telephone Encounter (Signed)
Pt is asking for a call to discuss her 09-29 appointment.  Her consult with Dr Gloriann Loan is not until 09-28.  Please call pt

## 2019-02-01 NOTE — Telephone Encounter (Signed)
I called Dr. Purvis Sheffield office ([facility] - [phone]).  I spoke to his surgery scheduler Joanne Chars) who will get the patient's appt moved to an earlier date.  She will call her with the new time once it has been moved.  I called the patient back to provide her with this update.  She will keep her appt here on 03/16/2019.

## 2019-02-12 DIAGNOSIS — Z4542 Encounter for adjustment and management of neuropacemaker (brain) (peripheral nerve) (spinal cord): Secondary | ICD-10-CM | POA: Diagnosis not present

## 2019-03-02 DIAGNOSIS — Z01818 Encounter for other preprocedural examination: Secondary | ICD-10-CM | POA: Diagnosis not present

## 2019-03-09 DIAGNOSIS — R569 Unspecified convulsions: Secondary | ICD-10-CM | POA: Diagnosis not present

## 2019-03-09 DIAGNOSIS — G43909 Migraine, unspecified, not intractable, without status migrainosus: Secondary | ICD-10-CM | POA: Diagnosis not present

## 2019-03-09 DIAGNOSIS — Z888 Allergy status to other drugs, medicaments and biological substances status: Secondary | ICD-10-CM | POA: Diagnosis not present

## 2019-03-09 DIAGNOSIS — Z79899 Other long term (current) drug therapy: Secondary | ICD-10-CM | POA: Diagnosis not present

## 2019-03-09 DIAGNOSIS — Z87891 Personal history of nicotine dependence: Secondary | ICD-10-CM | POA: Diagnosis not present

## 2019-03-09 DIAGNOSIS — G40909 Epilepsy, unspecified, not intractable, without status epilepticus: Secondary | ICD-10-CM | POA: Diagnosis not present

## 2019-03-09 DIAGNOSIS — Z4542 Encounter for adjustment and management of neuropacemaker (brain) (peripheral nerve) (spinal cord): Secondary | ICD-10-CM | POA: Diagnosis not present

## 2019-03-09 DIAGNOSIS — T85193A Other mechanical complication of implanted electronic neurostimulator, generator, initial encounter: Secondary | ICD-10-CM | POA: Diagnosis not present

## 2019-03-09 DIAGNOSIS — I10 Essential (primary) hypertension: Secondary | ICD-10-CM | POA: Diagnosis not present

## 2019-03-16 ENCOUNTER — Ambulatory Visit (INDEPENDENT_AMBULATORY_CARE_PROVIDER_SITE_OTHER): Payer: Medicare Other | Admitting: Neurology

## 2019-03-16 ENCOUNTER — Other Ambulatory Visit: Payer: Self-pay

## 2019-03-16 ENCOUNTER — Encounter: Payer: Self-pay | Admitting: Neurology

## 2019-03-16 VITALS — BP 141/87 | HR 82 | Temp 97.5°F | Ht 66.0 in | Wt 172.5 lb

## 2019-03-16 DIAGNOSIS — G40219 Localization-related (focal) (partial) symptomatic epilepsy and epileptic syndromes with complex partial seizures, intractable, without status epilepticus: Secondary | ICD-10-CM

## 2019-03-16 DIAGNOSIS — G40209 Localization-related (focal) (partial) symptomatic epilepsy and epileptic syndromes with complex partial seizures, not intractable, without status epilepticus: Secondary | ICD-10-CM

## 2019-03-16 DIAGNOSIS — G43009 Migraine without aura, not intractable, without status migrainosus: Secondary | ICD-10-CM | POA: Diagnosis not present

## 2019-03-16 MED ORDER — CLOBAZAM 10 MG PO TABS: 10.0000 mg | ORAL_TABLET | Freq: Every day | ORAL | 5 refills | Status: DC

## 2019-03-16 MED ORDER — OXCARBAZEPINE 300 MG PO TABS: 450.0000 mg | ORAL_TABLET | Freq: Two times a day (BID) | ORAL | 11 refills | Status: DC

## 2019-03-16 MED ORDER — ROPINIROLE HCL ER 2 MG PO TB24: 2.0000 mg | ORAL_TABLET | Freq: Every day | ORAL | 11 refills | Status: DC

## 2019-03-16 MED ORDER — TOPIRAMATE 50 MG PO TABS: 150.0000 mg | ORAL_TABLET | Freq: Two times a day (BID) | ORAL | 11 refills | Status: DC
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
## 2019-03-17 DIAGNOSIS — G2581 Restless legs syndrome: Secondary | ICD-10-CM | POA: Diagnosis not present

## 2019-03-17 DIAGNOSIS — K219 Gastro-esophageal reflux disease without esophagitis: Secondary | ICD-10-CM | POA: Diagnosis not present

## 2019-03-17 DIAGNOSIS — D519 Vitamin B12 deficiency anemia, unspecified: Secondary | ICD-10-CM | POA: Diagnosis not present

## 2019-03-17 DIAGNOSIS — Z23 Encounter for immunization: Secondary | ICD-10-CM | POA: Diagnosis not present

## 2019-03-17 DIAGNOSIS — I10 Essential (primary) hypertension: Secondary | ICD-10-CM | POA: Diagnosis not present

## 2019-03-22 ENCOUNTER — Ambulatory Visit: Payer: Medicare Other | Admitting: Podiatry

## 2019-04-07 DIAGNOSIS — M25561 Pain in right knee: Secondary | ICD-10-CM | POA: Diagnosis not present

## 2019-04-07 DIAGNOSIS — R030 Elevated blood-pressure reading, without diagnosis of hypertension: Secondary | ICD-10-CM | POA: Diagnosis not present

## 2019-04-08 DIAGNOSIS — S83241A Other tear of medial meniscus, current injury, right knee, initial encounter: Secondary | ICD-10-CM | POA: Diagnosis not present

## 2019-04-12 DIAGNOSIS — M1711 Unilateral primary osteoarthritis, right knee: Secondary | ICD-10-CM | POA: Diagnosis not present

## 2019-04-12 DIAGNOSIS — M25561 Pain in right knee: Secondary | ICD-10-CM | POA: Diagnosis not present

## 2019-04-16 ENCOUNTER — Other Ambulatory Visit: Payer: Self-pay | Admitting: Sports Medicine

## 2019-04-16 ENCOUNTER — Other Ambulatory Visit: Payer: Self-pay

## 2019-04-16 ENCOUNTER — Ambulatory Visit: Payer: Medicare Other | Admitting: Sports Medicine

## 2019-04-16 DIAGNOSIS — M79672 Pain in left foot: Secondary | ICD-10-CM

## 2019-07-05 DIAGNOSIS — K219 Gastro-esophageal reflux disease without esophagitis: Secondary | ICD-10-CM | POA: Diagnosis not present

## 2019-07-28 DIAGNOSIS — R4020 Unspecified coma: Secondary | ICD-10-CM | POA: Diagnosis not present

## 2019-07-28 DIAGNOSIS — S0511XA Contusion of eyeball and orbital tissues, right eye, initial encounter: Secondary | ICD-10-CM | POA: Diagnosis not present

## 2019-07-28 DIAGNOSIS — R569 Unspecified convulsions: Secondary | ICD-10-CM | POA: Diagnosis not present

## 2019-07-28 DIAGNOSIS — S52592A Other fractures of lower end of left radius, initial encounter for closed fracture: Secondary | ICD-10-CM | POA: Diagnosis not present

## 2019-07-28 DIAGNOSIS — R220 Localized swelling, mass and lump, head: Secondary | ICD-10-CM | POA: Diagnosis not present

## 2019-07-28 DIAGNOSIS — S0993XA Unspecified injury of face, initial encounter: Secondary | ICD-10-CM | POA: Diagnosis not present

## 2019-07-28 DIAGNOSIS — M25531 Pain in right wrist: Secondary | ICD-10-CM | POA: Diagnosis not present

## 2019-07-28 DIAGNOSIS — S0990XA Unspecified injury of head, initial encounter: Secondary | ICD-10-CM | POA: Diagnosis not present

## 2019-07-28 DIAGNOSIS — S52502A Unspecified fracture of the lower end of left radius, initial encounter for closed fracture: Secondary | ICD-10-CM | POA: Diagnosis not present

## 2019-07-28 DIAGNOSIS — R404 Transient alteration of awareness: Secondary | ICD-10-CM | POA: Diagnosis not present

## 2019-07-28 DIAGNOSIS — S199XXA Unspecified injury of neck, initial encounter: Secondary | ICD-10-CM | POA: Diagnosis not present

## 2019-07-28 DIAGNOSIS — I10 Essential (primary) hypertension: Secondary | ICD-10-CM | POA: Diagnosis not present

## 2019-07-29 DIAGNOSIS — S52502A Unspecified fracture of the lower end of left radius, initial encounter for closed fracture: Secondary | ICD-10-CM | POA: Diagnosis not present

## 2019-08-05 DIAGNOSIS — S52502A Unspecified fracture of the lower end of left radius, initial encounter for closed fracture: Secondary | ICD-10-CM | POA: Diagnosis not present

## 2019-08-18 DIAGNOSIS — S52502A Unspecified fracture of the lower end of left radius, initial encounter for closed fracture: Secondary | ICD-10-CM | POA: Diagnosis not present

## 2019-08-18 DIAGNOSIS — Z1159 Encounter for screening for other viral diseases: Secondary | ICD-10-CM | POA: Diagnosis not present

## 2019-08-23 DIAGNOSIS — Z79899 Other long term (current) drug therapy: Secondary | ICD-10-CM | POA: Diagnosis not present

## 2019-08-23 DIAGNOSIS — F1011 Alcohol abuse, in remission: Secondary | ICD-10-CM | POA: Diagnosis not present

## 2019-08-23 DIAGNOSIS — S52552P Other extraarticular fracture of lower end of left radius, subsequent encounter for closed fracture with malunion: Secondary | ICD-10-CM | POA: Diagnosis not present

## 2019-08-23 DIAGNOSIS — I10 Essential (primary) hypertension: Secondary | ICD-10-CM | POA: Diagnosis not present

## 2019-08-23 DIAGNOSIS — G2581 Restless legs syndrome: Secondary | ICD-10-CM | POA: Diagnosis not present

## 2019-08-23 DIAGNOSIS — G8918 Other acute postprocedural pain: Secondary | ICD-10-CM | POA: Diagnosis not present

## 2019-08-23 DIAGNOSIS — E7800 Pure hypercholesterolemia, unspecified: Secondary | ICD-10-CM | POA: Diagnosis not present

## 2019-08-23 DIAGNOSIS — K219 Gastro-esophageal reflux disease without esophagitis: Secondary | ICD-10-CM | POA: Diagnosis not present

## 2019-08-23 DIAGNOSIS — Z87891 Personal history of nicotine dependence: Secondary | ICD-10-CM | POA: Diagnosis not present

## 2019-08-23 DIAGNOSIS — S52502P Unspecified fracture of the lower end of left radius, subsequent encounter for closed fracture with malunion: Secondary | ICD-10-CM | POA: Diagnosis not present

## 2019-08-23 DIAGNOSIS — G40909 Epilepsy, unspecified, not intractable, without status epilepticus: Secondary | ICD-10-CM | POA: Diagnosis not present

## 2019-09-01 DIAGNOSIS — F332 Major depressive disorder, recurrent severe without psychotic features: Secondary | ICD-10-CM | POA: Diagnosis not present

## 2019-09-07 DIAGNOSIS — S52502P Unspecified fracture of the lower end of left radius, subsequent encounter for closed fracture with malunion: Secondary | ICD-10-CM | POA: Diagnosis not present

## 2019-09-14 ENCOUNTER — Ambulatory Visit: Payer: Medicare Other | Admitting: Neurology

## 2019-09-14 DIAGNOSIS — K219 Gastro-esophageal reflux disease without esophagitis: Secondary | ICD-10-CM | POA: Diagnosis not present

## 2019-09-14 DIAGNOSIS — D519 Vitamin B12 deficiency anemia, unspecified: Secondary | ICD-10-CM | POA: Diagnosis not present

## 2019-09-14 DIAGNOSIS — E785 Hyperlipidemia, unspecified: Secondary | ICD-10-CM | POA: Diagnosis not present

## 2019-09-14 DIAGNOSIS — I10 Essential (primary) hypertension: Secondary | ICD-10-CM | POA: Diagnosis not present

## 2019-10-01 DIAGNOSIS — M25561 Pain in right knee: Secondary | ICD-10-CM | POA: Diagnosis not present

## 2019-10-05 DIAGNOSIS — M722 Plantar fascial fibromatosis: Secondary | ICD-10-CM | POA: Diagnosis not present

## 2019-10-05 DIAGNOSIS — S52502A Unspecified fracture of the lower end of left radius, initial encounter for closed fracture: Secondary | ICD-10-CM | POA: Diagnosis not present

## 2019-10-05 DIAGNOSIS — M172 Bilateral post-traumatic osteoarthritis of knee: Secondary | ICD-10-CM | POA: Diagnosis not present

## 2019-10-12 ENCOUNTER — Ambulatory Visit (INDEPENDENT_AMBULATORY_CARE_PROVIDER_SITE_OTHER): Payer: Medicare Other | Admitting: Neurology

## 2019-10-12 ENCOUNTER — Other Ambulatory Visit: Payer: Self-pay

## 2019-10-12 ENCOUNTER — Encounter: Payer: Self-pay | Admitting: Neurology

## 2019-10-12 VITALS — BP 142/82 | HR 65 | Temp 97.7°F | Ht 66.0 in | Wt 187.5 lb

## 2019-10-12 DIAGNOSIS — G43009 Migraine without aura, not intractable, without status migrainosus: Secondary | ICD-10-CM | POA: Diagnosis not present

## 2019-10-12 DIAGNOSIS — G40219 Localization-related (focal) (partial) symptomatic epilepsy and epileptic syndromes with complex partial seizures, intractable, without status epilepticus: Secondary | ICD-10-CM | POA: Diagnosis not present

## 2019-10-12 DIAGNOSIS — G40209 Localization-related (focal) (partial) symptomatic epilepsy and epileptic syndromes with complex partial seizures, not intractable, without status epilepticus: Secondary | ICD-10-CM

## 2019-10-12 DIAGNOSIS — Z79899 Other long term (current) drug therapy: Secondary | ICD-10-CM | POA: Diagnosis not present

## 2019-10-12 MED ORDER — CLOBAZAM 10 MG PO TABS: 10.0000 mg | ORAL_TABLET | Freq: Two times a day (BID) | ORAL | 5 refills | Status: DC
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
## 2019-11-05 DIAGNOSIS — S52502A Unspecified fracture of the lower end of left radius, initial encounter for closed fracture: Secondary | ICD-10-CM | POA: Diagnosis not present

## 2020-01-05 DIAGNOSIS — S52125A Nondisplaced fracture of head of left radius, initial encounter for closed fracture: Secondary | ICD-10-CM | POA: Diagnosis not present

## 2020-01-05 DIAGNOSIS — M25422 Effusion, left elbow: Secondary | ICD-10-CM | POA: Diagnosis not present

## 2020-01-05 DIAGNOSIS — Z9889 Other specified postprocedural states: Secondary | ICD-10-CM | POA: Diagnosis not present

## 2020-01-05 DIAGNOSIS — R55 Syncope and collapse: Secondary | ICD-10-CM | POA: Diagnosis not present

## 2020-01-05 DIAGNOSIS — S0033XA Contusion of nose, initial encounter: Secondary | ICD-10-CM | POA: Diagnosis not present

## 2020-01-05 DIAGNOSIS — G40909 Epilepsy, unspecified, not intractable, without status epilepticus: Secondary | ICD-10-CM | POA: Diagnosis not present

## 2020-01-05 DIAGNOSIS — M79602 Pain in left arm: Secondary | ICD-10-CM | POA: Diagnosis not present

## 2020-01-05 DIAGNOSIS — R569 Unspecified convulsions: Secondary | ICD-10-CM | POA: Diagnosis not present

## 2020-01-11 DIAGNOSIS — S52135A Nondisplaced fracture of neck of left radius, initial encounter for closed fracture: Secondary | ICD-10-CM | POA: Diagnosis not present

## 2020-01-11 DIAGNOSIS — S52502A Unspecified fracture of the lower end of left radius, initial encounter for closed fracture: Secondary | ICD-10-CM | POA: Diagnosis not present

## 2020-01-13 ENCOUNTER — Ambulatory Visit: Payer: Medicare Other | Admitting: Neurology

## 2020-01-19 DIAGNOSIS — F332 Major depressive disorder, recurrent severe without psychotic features: Secondary | ICD-10-CM | POA: Diagnosis not present

## 2020-02-08 DIAGNOSIS — S52502A Unspecified fracture of the lower end of left radius, initial encounter for closed fracture: Secondary | ICD-10-CM | POA: Diagnosis not present

## 2020-02-08 DIAGNOSIS — S52135A Nondisplaced fracture of neck of left radius, initial encounter for closed fracture: Secondary | ICD-10-CM | POA: Diagnosis not present

## 2020-03-24 DIAGNOSIS — M722 Plantar fascial fibromatosis: Secondary | ICD-10-CM | POA: Diagnosis not present

## 2020-03-24 DIAGNOSIS — S52502A Unspecified fracture of the lower end of left radius, initial encounter for closed fracture: Secondary | ICD-10-CM | POA: Diagnosis not present

## 2020-03-24 DIAGNOSIS — S52135A Nondisplaced fracture of neck of left radius, initial encounter for closed fracture: Secondary | ICD-10-CM | POA: Diagnosis not present

## 2020-04-19 DIAGNOSIS — F332 Major depressive disorder, recurrent severe without psychotic features: Secondary | ICD-10-CM | POA: Diagnosis not present

## 2020-05-08 ENCOUNTER — Telehealth: Payer: Self-pay | Admitting: Neurology

## 2020-05-08 NOTE — Telephone Encounter (Signed)
I returned the call to the patient. She started having pain in her teeth from her VNS last week to the point of having to tape her magnet over the generator. She has a pending appt this Wednesday. She wanted Sarah and Dr. [name] to be aware of this information. She would like for it to be adjusted while she is at the office.

## 2020-05-08 NOTE — Telephone Encounter (Signed)
I can check her VNS at her visit. Please come get me

## 2020-05-08 NOTE — Telephone Encounter (Signed)
Pt called, VNS making my teeth hurt. Would like for the nurse to call me.
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
## 2020-05-10 ENCOUNTER — Encounter: Payer: Self-pay | Admitting: Neurology

## 2020-05-10 ENCOUNTER — Telehealth: Payer: Self-pay

## 2020-05-10 ENCOUNTER — Ambulatory Visit (INDEPENDENT_AMBULATORY_CARE_PROVIDER_SITE_OTHER): Payer: Medicare Other | Admitting: Neurology

## 2020-05-10 VITALS — BP 155/81 | HR 72 | Ht 66.0 in | Wt 160.6 lb

## 2020-05-10 DIAGNOSIS — G43009 Migraine without aura, not intractable, without status migrainosus: Secondary | ICD-10-CM

## 2020-05-10 DIAGNOSIS — G40209 Localization-related (focal) (partial) symptomatic epilepsy and epileptic syndromes with complex partial seizures, not intractable, without status epilepticus: Secondary | ICD-10-CM | POA: Diagnosis not present

## 2020-05-10 MED ORDER — KETOROLAC TROMETHAMINE 30 MG/ML IJ SOLN
30.0000 mg | Freq: Once | INTRAMUSCULAR | Status: AC
  Administered 2020-05-10: 30 mg via INTRAMUSCULAR

## 2020-05-10 MED ORDER — KETOROLAC TROMETHAMINE 30 MG/ML IJ SOLN: 30.0000 mg | Freq: Once | INTRAMUSCULAR | Status: DC

## 2020-05-10 MED ORDER — OXCARBAZEPINE 300 MG PO TABS: 450.0000 mg | ORAL_TABLET | Freq: Two times a day (BID) | ORAL | 11 refills | Status: DC

## 2020-05-10 MED ORDER — AJOVY 225 MG/1.5ML ~~LOC~~ SOAJ: 225.0000 mg | SUBCUTANEOUS | 11 refills | Status: DC

## 2020-05-10 MED ORDER — CLOBAZAM 10 MG PO TABS: 10.0000 mg | ORAL_TABLET | Freq: Two times a day (BID) | ORAL | 5 refills | Status: DC

## 2020-05-10 MED ORDER — TOPIRAMATE 50 MG PO TABS: 150.0000 mg | ORAL_TABLET | Freq: Two times a day (BID) | ORAL | 11 refills | Status: DC

## 2020-05-10 MED ORDER — RIZATRIPTAN BENZOATE 10 MG PO TBDP: 10.0000 mg | ORAL_TABLET | ORAL | 11 refills | Status: DC | PRN

## 2020-05-10 NOTE — Telephone Encounter (Signed)
Will send to [facility] refills for Onfi, # 60 tablets 5 refills.

## 2020-05-10 NOTE — Patient Instructions (Signed)
Try Maxalt for acute headache instead of Imitrex  Continue current medications Will give Toradol injection today for headache  See you back in 6 months

## 2020-05-10 NOTE — Telephone Encounter (Signed)
-----   Message from Suzzanne Cloud, NP sent at 05/10/2020  2:35 PM EST ----- Can you check on Onfi, I pulled the drug registry last filled 11/17/19, she says medications through [facility], possible not filling through registry?

## 2020-05-10 NOTE — Telephone Encounter (Signed)
Checked [place] [facility] Registry. Clobazam was last filled on 11/17/19  for 30 tabs at [facility] in [place]. That prescription had 5 refills and written on 10/12/19. This was the 2nd refill for that prescription.   Also noticed that clobazam was called in by her PCP Dr. Nelda Bucks on 08/26/19 for 30 tabs. This prescription was sent to [facility] without any refills.   [facility] and spoke with pharmacist. She stated that the patient does not get this medication from them, appears to get it from a mail order pharmacy.   [facility] and spoke with pharmacy tech [place]. She stated that the last RX they received was on 08/26/19 from Dr. Delena Bali. They attempted to get refills from the PCP but the request was denied. They will be able to fill the RX once they have a RX on file for her. She verified that they are able to receive transferred controlled substance refills from other pharmacies.

## 2020-06-02 DIAGNOSIS — I10 Essential (primary) hypertension: Secondary | ICD-10-CM | POA: Diagnosis not present

## 2020-06-02 DIAGNOSIS — Z23 Encounter for immunization: Secondary | ICD-10-CM | POA: Diagnosis not present

## 2020-06-02 DIAGNOSIS — G2581 Restless legs syndrome: Secondary | ICD-10-CM | POA: Diagnosis not present

## 2020-06-02 DIAGNOSIS — K219 Gastro-esophageal reflux disease without esophagitis: Secondary | ICD-10-CM | POA: Diagnosis not present

## 2020-06-02 DIAGNOSIS — H9202 Otalgia, left ear: Secondary | ICD-10-CM | POA: Diagnosis not present

## 2020-06-02 DIAGNOSIS — G8929 Other chronic pain: Secondary | ICD-10-CM | POA: Diagnosis not present

## 2020-06-02 DIAGNOSIS — E785 Hyperlipidemia, unspecified: Secondary | ICD-10-CM | POA: Diagnosis not present

## 2020-06-02 DIAGNOSIS — D519 Vitamin B12 deficiency anemia, unspecified: Secondary | ICD-10-CM | POA: Diagnosis not present

## 2020-06-02 DIAGNOSIS — M542 Cervicalgia: Secondary | ICD-10-CM | POA: Diagnosis not present

## 2020-06-27 ENCOUNTER — Telehealth: Payer: Self-pay | Admitting: Neurology

## 2020-06-27 MED ORDER — NURTEC 75 MG PO TBDP: ORAL_TABLET | ORAL | 11 refills | Status: DC

## 2020-06-27 NOTE — Telephone Encounter (Signed)
Pt last seen 05/10/20, maxalt not working well for pt. Has tried imitrex in past.   On ajovy.  What else can we do.

## 2020-06-27 NOTE — Addendum Note (Signed)
Addended by: Suzzanne Cloud on: 06/27/2020 04:46 PM   Modules accepted: Orders

## 2020-06-27 NOTE — Telephone Encounter (Signed)
Pt called, rizatriptan (MAXALT-MLT) 10 MG disintegrating tablet is not working. Can prescribe something else for my migraines. Would like a call from the nurse.

## 2020-06-27 NOTE — Addendum Note (Signed)
Addended by: Brandon Melnick on: 06/27/2020 04:16 PM   Modules accepted: Orders

## 2020-06-27 NOTE — Telephone Encounter (Signed)
I see she has tried Imitrex and Maxalt, any other triptans? I would like to try Nurtec or Roselyn Meier if you think we can get coverage, otherwise, we could try Relpax

## 2020-06-27 NOTE — Telephone Encounter (Signed)
Spoke to pt Kim [name] ODT did not help at all.  She is ok to try the CGRP Nurtec.  New insurance [place].  ID [id number] GRP [id number] RX BIN [id number] TDSKA [id number], RX GRP COS. I relayed that will require PA.

## 2020-06-28 NOTE — Telephone Encounter (Signed)
Pt. states pharmacy informed her that the medication was not pre approved by insurance & the pharmacy cannot fill this because we did not have this done first. Please advise.

## 2020-06-28 NOTE — Telephone Encounter (Signed)
Nurtec PA on CMM, key: [id number], [id number], failed: rizatriptan, imitrex, relpax. Your information has been sent to [facility].

## 2020-06-28 NOTE — Telephone Encounter (Addendum)
Nurtec approved through 06/16/2021. Called patient and informed her of approval. Patient verbalized understanding, appreciation.

## 2020-06-29 DIAGNOSIS — E785 Hyperlipidemia, unspecified: Secondary | ICD-10-CM | POA: Diagnosis not present

## 2020-06-29 DIAGNOSIS — Z0000 Encounter for general adult medical examination without abnormal findings: Secondary | ICD-10-CM | POA: Diagnosis not present

## 2020-06-29 DIAGNOSIS — Z9181 History of falling: Secondary | ICD-10-CM | POA: Diagnosis not present

## 2020-07-18 ENCOUNTER — Telehealth: Payer: Self-pay | Admitting: Neurology

## 2020-07-18 NOTE — Telephone Encounter (Signed)
Pt called,Have been for approved by [employer]. Need PA for Nurtec and Ajovy sent to [facility]. Have not received my card yet. [place] will contact you. Would like a call from the nurse when PA has been sent for my medications. Thank you

## 2020-07-18 NOTE — Telephone Encounter (Signed)
Pt. states she also needs folic acid sent to [facility] as well.

## 2020-07-19 DIAGNOSIS — R69 Illness, unspecified: Secondary | ICD-10-CM | POA: Diagnosis not present

## 2020-07-19 MED ORDER — NURTEC 75 MG PO TBDP: ORAL_TABLET | ORAL | 11 refills | Status: DC

## 2020-07-19 NOTE — Addendum Note (Signed)
Addended by: Brandon Melnick on: 07/19/2020 09:47 AM   Modules accepted: Orders

## 2020-07-19 NOTE — Telephone Encounter (Signed)
I called pt and let her know that folic acid is not one that we prescribed.  She also needs to get [place] the pharmacy information for [place] to do PA on medications prescribed for migraines.  She will let us know.

## 2020-07-20 MED ORDER — AJOVY 225 MG/1.5ML ~~LOC~~ SOAJ: 225.0000 mg | SUBCUTANEOUS | 3 refills | Status: DC

## 2020-07-20 MED ORDER — UBRELVY 100 MG PO TABS: ORAL_TABLET | ORAL | 3 refills | Status: DC

## 2020-07-20 NOTE — Telephone Encounter (Signed)
Pt has called back to give the following all with the start date of [birth year] YO#[id number] SEL#[id number] PCN#[id number]#[id number] Plan#[id number]

## 2020-07-20 NOTE — Telephone Encounter (Signed)
I called [employer] (held for 20 minutes prior to speaking to pharmacist, Lenna Sciara). They had the patient's outdate insurance information. They will get her new information into their system and try to re-process the Ajovy and Nurtec prescriptions. She will call me back, if the medications need a PA.

## 2020-07-20 NOTE — Addendum Note (Signed)
Addended by: Noberto Retort C on: 07/20/2020 11:55 AM   Modules accepted: Orders

## 2020-07-20 NOTE — Telephone Encounter (Addendum)
PA for Ajovy 225mg  completed on covermymeds (key: [id number]). Approved through 06/16/21.  Nurtec is non-formulary. Roselyn Meier is preferred.   Per vo by Butler Denmark, NP, provide new rx for Ubrelvy 100mg , #8/month. Take 1 tab at onset of migraine.  May repeat in 2 hrs, if needed.  Max dose: 2 tabs/day or 8 tabs/month (90-day rx is less expensive for patient through [facility]).  Pharmacy informed to void Nurtec.  PA for Ubrelvy 100mg  completed on covermymeds (key: [id number]). PA approved through 06/16/21.  Patient has coverage with [facility]. ZH#[id number]. [phone].  Patient and pharmacy have been notified of the above information.

## 2020-08-14 ENCOUNTER — Telehealth: Payer: Self-pay | Admitting: Neurology

## 2020-08-14 NOTE — Telephone Encounter (Addendum)
I spoke to the patient and she confirmed that her current medication list has been review against her actual pill pack at home. The only discrepancy is that she does not have Onfi 10mg , one tablet twice daily. She is unsure how long she has been off of this medication. She did not realize it until today. She said it may need a PA. I will work on this issue for her.  She is reporting intermittent, visual and auditory hallucinations for the last 2-3 weeks. She gives the example of seeing her phone smoking or bugs crawling on her bed covers. She is also hearing noises in the distant and it makes her think someone is in her home. At the time, she feels this is truly happening. States her husband is having to correct her and let her know it is not real.  Says she does not feel this is necessarily a psychiatric issue. It is different than her past experiences. She is asking if Dr. [name] would like to order a brain scan.  ____________________________________________  I attempted to start a PA for Onfi on covermymeds. I received a message that the patient currently has access to this medication and that a PA is not required. I tried to call [facility] but they were closed [phone]). I will call back to see why this medication is not in her pill pack.

## 2020-08-14 NOTE — Telephone Encounter (Signed)
Pt has called back stating she received a call from the RN of Dr Rhea Belton NP.  Pt states she would like a response from Dr Rhea Belton RN and not the RN of a NP.

## 2020-08-14 NOTE — Telephone Encounter (Signed)
I did fax to husband list of medications.  Received confirmation received.  Pt to speak to psychiatry as well.

## 2020-08-14 NOTE — Telephone Encounter (Signed)
Noted  

## 2020-08-14 NOTE — Telephone Encounter (Signed)
Spoke to patient again.  They did receive the list of medications.  She stated that 2 years ago she stopped taking all of her medications due to weight gain.  So that was when she had the breakdown.  She has not stopped her medications at this time so that is the concern for the 2 to 3-week hallucinations that she has been having.  She will follow up with psychiatry.  She verbalized understanding of the plan.

## 2020-08-14 NOTE — Telephone Encounter (Signed)
Spoke to patient.  She would like a list of her medications faxed to her husband Cristie Hem who is on her DPR at [phone].  Patient gets her medications from [facility].  she is concerned because she is having symptoms seeing and hearing things that are not there.  This is something that she had previously maybe 2 years ago.  She has a call also into her psychiatrist.   These symptoms have been going on for like 2 to 3 weeks now.

## 2020-08-14 NOTE — Telephone Encounter (Signed)
Pt called, would like a list of ;my medications to make sure I am taking all my medications. I have started seeing things that are not there. Would like a call from the nurse.

## 2020-08-15 ENCOUNTER — Other Ambulatory Visit: Payer: Self-pay | Admitting: *Deleted

## 2020-08-15 ENCOUNTER — Telehealth: Payer: Self-pay | Admitting: Neurology

## 2020-08-15 MED ORDER — CLOBAZAM 10 MG PO TABS: 10.0000 mg | ORAL_TABLET | Freq: Two times a day (BID) | ORAL | 5 refills | Status: DC

## 2020-08-15 NOTE — Telephone Encounter (Signed)
I spoke to Kim [name] and she verbalized understanding of the information below. She is aware to expect a call from [facility] to set up an overnight delivery of Onfi. She will get restarted on it as soon as she receives it. She has also contacted her psychiatrist and is waiting for a call back. She will keep [place] up-to-date on how she is doing and call back if her appt needs to be moved to an earlier date with Dr. [name].

## 2020-08-15 NOTE — Telephone Encounter (Signed)
Her reported seizure-like spells are staring spells, lapse of memory, she did have long history of substance abuse including alcohol use, binge drink history in the past, depression with psychosis,  I do think her current complaints are less likely related to her partial seizure, please let her restart Onfi 10 mg twice a day, if she continues to have visual and auditory hallucinations, she should continue to work with her psychiatrist/primary care physician  Most recent CT head was in April 2020,  If she does not have any focal signs, less likely repeat CT scan will show any structural abnormality  Follow-up visit is pending in May 2022, if she still has significant problem after she restarted Onfi 10 mg twice a day, she may call back our clinic, move up her appointment, we may consider repeat laboratory evaluation at that time

## 2020-08-15 NOTE — Telephone Encounter (Signed)
I spoke to Kim [name] and she verbalized understanding of the information below. She is aware to expect a call from [facility] to set up an overnight delivery of Onfi. She will get restarted on it as soon as she receives it. She has also contacted her psychiatrist and is waiting for a call back. She will keep [place] up-to-date on how she is doing and call back if her appt needs to be moved to an earlier date with Dr. [name].

## 2020-08-15 NOTE — Addendum Note (Signed)
Addended by: Marcial Pacas on: 08/15/2020 09:02 AM   Modules accepted: Orders

## 2020-08-15 NOTE — Telephone Encounter (Addendum)
Update on Onfi 10mg . I called the pharmacy and spoke to [facility]. She found the [facility] prescription sent in on 05/10/20 with refills to be deactivated. She is unsure why this happened and she is unable to correct the problem. They need a new prescription. E-scribe request sent to Dr. [name] for authorization (1st request printed - rx was destroyed). The pharmacy will overnight the medication so the patient can get started back on it right away. They apologized for the mistake.

## 2020-08-15 NOTE — Telephone Encounter (Addendum)
Marcial Pacas, MD 4 minutes ago (10:21 AM)      Her reported seizure-like spells are staring spells, lapse of memory, she did have long history of substance abuse including alcohol use, binge drink history in the past, depression with psychosis,  I do think her current complaints are less likely related to her partial seizure, please let her restart Onfi 10 mg twice a day, if she continues to have visual and auditory hallucinations, she should continue to work with her psychiatrist/primary care physician  Most recent CT head was in April 2020,  If she does not have any focal signs, less likely repeat CT scan will show any structural abnormality  Follow-up visit is pending in May 2022, if she still has significant problem after she restarted Onfi 10 mg twice a day, she may call back our clinic, move up her appointment, we may consider repeat laboratory evaluation at that time

## 2020-08-21 DIAGNOSIS — R69 Illness, unspecified: Secondary | ICD-10-CM | POA: Diagnosis not present

## 2020-08-22 ENCOUNTER — Telehealth: Payer: Self-pay | Admitting: *Deleted

## 2020-08-22 ENCOUNTER — Other Ambulatory Visit: Payer: Self-pay | Admitting: *Deleted

## 2020-08-22 NOTE — Telephone Encounter (Signed)
Received letter stating that patient's plan will only allow #90 per 30 days for her topiramate 50mg . Her prescription instructs her to take 3 tablets BID.   Quantity limit override case started for topiramate 50mg , #180 for 30 days.  She has coverage with [employer] [phone]). [phone].  Unable to complete case on covermymeds. Initiated urgent case over the phone.   Case XO#[id number] approved through 06/16/2021.

## 2020-09-11 DIAGNOSIS — R69 Illness, unspecified: Secondary | ICD-10-CM | POA: Diagnosis not present

## 2020-09-15 ENCOUNTER — Telehealth: Payer: Self-pay | Admitting: Neurology

## 2020-09-15 DIAGNOSIS — Z7722 Contact with and (suspected) exposure to environmental tobacco smoke (acute) (chronic): Secondary | ICD-10-CM | POA: Diagnosis not present

## 2020-09-15 DIAGNOSIS — Z79899 Other long term (current) drug therapy: Secondary | ICD-10-CM | POA: Diagnosis not present

## 2020-09-15 DIAGNOSIS — G928 Other toxic encephalopathy: Secondary | ICD-10-CM | POA: Diagnosis not present

## 2020-09-15 DIAGNOSIS — E876 Hypokalemia: Secondary | ICD-10-CM | POA: Diagnosis not present

## 2020-09-15 DIAGNOSIS — J449 Chronic obstructive pulmonary disease, unspecified: Secondary | ICD-10-CM | POA: Diagnosis not present

## 2020-09-15 DIAGNOSIS — T50911A Poisoning by multiple unspecified drugs, medicaments and biological substances, accidental (unintentional), initial encounter: Secondary | ICD-10-CM | POA: Diagnosis not present

## 2020-09-15 DIAGNOSIS — Z87891 Personal history of nicotine dependence: Secondary | ICD-10-CM | POA: Diagnosis not present

## 2020-09-15 DIAGNOSIS — Z8669 Personal history of other diseases of the nervous system and sense organs: Secondary | ICD-10-CM | POA: Diagnosis not present

## 2020-09-15 DIAGNOSIS — R42 Dizziness and giddiness: Secondary | ICD-10-CM | POA: Diagnosis not present

## 2020-09-15 DIAGNOSIS — T424X5A Adverse effect of benzodiazepines, initial encounter: Secondary | ICD-10-CM | POA: Diagnosis not present

## 2020-09-15 DIAGNOSIS — R69 Illness, unspecified: Secondary | ICD-10-CM | POA: Diagnosis not present

## 2020-09-15 DIAGNOSIS — M6281 Muscle weakness (generalized): Secondary | ICD-10-CM | POA: Diagnosis not present

## 2020-09-15 NOTE — Telephone Encounter (Signed)
Pt called needing to speak the RN regarding several falls she has had and to also give her information that she received from her Psychiatrist. Please advise.

## 2020-09-15 NOTE — Telephone Encounter (Addendum)
I spoke to the patient who is reporting unsteady gait, dizziness, falls, diplopia, hand tremors since recent medication changes were made by her psychiatrist. I can also hear that her speech is slurred (well known patient to our office who has normal speech). She is unsure of all the details but remembers one increase to her oxcarbazepine. She was already on 450mg  BID. I called her husband who thinks is was increased to 600mg  BID. The more concerning issue it they think she has mistakenly taken both prescriptions (1050mg  BID). I advised her husband to gather all her medications (bottles and pill packs) and proceed to the ED. He verbalized understanding.   Dr. [name] has been notified of this issue and agrees that she should go to the hospital.

## 2020-09-17 DIAGNOSIS — T50911A Poisoning by multiple unspecified drugs, medicaments and biological substances, accidental (unintentional), initial encounter: Secondary | ICD-10-CM | POA: Diagnosis not present

## 2020-09-17 DIAGNOSIS — E876 Hypokalemia: Secondary | ICD-10-CM | POA: Diagnosis not present

## 2020-09-17 DIAGNOSIS — R69 Illness, unspecified: Secondary | ICD-10-CM | POA: Diagnosis not present

## 2020-09-17 DIAGNOSIS — Z87891 Personal history of nicotine dependence: Secondary | ICD-10-CM | POA: Diagnosis not present

## 2020-09-17 DIAGNOSIS — J449 Chronic obstructive pulmonary disease, unspecified: Secondary | ICD-10-CM | POA: Diagnosis not present

## 2020-09-17 DIAGNOSIS — Z8669 Personal history of other diseases of the nervous system and sense organs: Secondary | ICD-10-CM | POA: Diagnosis not present

## 2020-09-17 DIAGNOSIS — G928 Other toxic encephalopathy: Secondary | ICD-10-CM | POA: Diagnosis not present

## 2020-09-17 DIAGNOSIS — Z7722 Contact with and (suspected) exposure to environmental tobacco smoke (acute) (chronic): Secondary | ICD-10-CM | POA: Diagnosis not present

## 2020-09-17 DIAGNOSIS — M6281 Muscle weakness (generalized): Secondary | ICD-10-CM | POA: Diagnosis not present

## 2020-09-22 DIAGNOSIS — Z79899 Other long term (current) drug therapy: Secondary | ICD-10-CM | POA: Diagnosis not present

## 2020-09-22 DIAGNOSIS — T50905A Adverse effect of unspecified drugs, medicaments and biological substances, initial encounter: Secondary | ICD-10-CM | POA: Diagnosis not present

## 2020-09-22 DIAGNOSIS — J3489 Other specified disorders of nose and nasal sinuses: Secondary | ICD-10-CM | POA: Diagnosis not present

## 2020-09-22 DIAGNOSIS — M6281 Muscle weakness (generalized): Secondary | ICD-10-CM | POA: Diagnosis not present

## 2020-09-27 DIAGNOSIS — R69 Illness, unspecified: Secondary | ICD-10-CM | POA: Diagnosis not present

## 2020-09-27 NOTE — Telephone Encounter (Signed)
It is really unclear what exactly she is taking. Apparently, she has a bottle of oxcarbazepine 600mg  from her psychiatrist provider, Cecilie Lowers, NP (at [facility]). Per Sandy's documentation, there is no oxcarbazepine 300mg  in her pill pack from [facility]. On 09/15/20, it was reported she was taking a combination of both prescriptions (total of 1050mg  BID). She was barely able to complete a sentence when she called our office. She was sent to the ED. She went to [facility] (records have been requested, especially need the labs). This may have certainly been the case but the dosage is a little questionable now since Alvis Lemmings is saying they did not package the 300mg  tablets.  I have spoken to Dr. [name]. The only safe and complete way to get her back on track is to bring her in for an appt to review her medications. Dr. [name] does not have any openings. She was placed on Sarah's schedule. Her daughter will attend the appt with her. They will bring all medications (pill packs and bottles).

## 2020-09-27 NOTE — Telephone Encounter (Signed)
Called pt.  She was seen in [facility] for 2-3 days in April. Requested [place] hospital records.  She wanted to know what she should be taking of oxcarbamazepine I relayed 450mg  po bid. She had her [facility] pill pack for the month 09-21-20 thru 10-21-20.  She had questions about hydroxine and fluoxetine. Deferred those to her psych or provider who prescribes.  I called and spoke to [place] at [place],  I asked her about meds we prescribe that are in pill box this  Month.  She stated CLOBAZAM 10mg  po bid, AJOVY, TOPIRAMATE 150mg  po bid, UBRELVY PRN.  NO OXCARBAMAZEPINE in box.  Want to clarify the dose.`AWAITING [place] RECORDS.

## 2020-09-27 NOTE — Telephone Encounter (Signed)
Pt called back and stated she has not been on her medications since she went to the hospital and she is needing to discuss this with RN. Please advise.

## 2020-09-28 ENCOUNTER — Ambulatory Visit: Payer: Medicare HMO | Admitting: Neurology

## 2020-09-28 ENCOUNTER — Encounter: Payer: Self-pay | Admitting: Neurology

## 2020-09-28 VITALS — BP 118/77 | HR 65 | Wt 157.8 lb

## 2020-09-28 DIAGNOSIS — Z79899 Other long term (current) drug therapy: Secondary | ICD-10-CM

## 2020-09-28 DIAGNOSIS — G40209 Localization-related (focal) (partial) symptomatic epilepsy and epileptic syndromes with complex partial seizures, not intractable, without status epilepticus: Secondary | ICD-10-CM | POA: Diagnosis not present

## 2020-09-28 NOTE — Patient Instructions (Addendum)
Restart Trileptal 300 mg twice daily, stay at this dose until we get labs back  Check labs today, will call with update next week if we will increase to full dosing Continue Topamax 150 mg twice daily Keep Onfi 10 mg twice daily
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
## 2020-10-04 ENCOUNTER — Other Ambulatory Visit: Payer: Self-pay | Admitting: *Deleted

## 2020-10-04 LAB — COMPREHENSIVE METABOLIC PANEL
ALT: 28 IU/L (ref 0–32)
AST: 28 IU/L (ref 0–40)
Albumin/Globulin Ratio: 1.8 (ref 1.2–2.2)
Albumin: 4.8 g/dL (ref 3.8–4.9)
Alkaline Phosphatase: 187 IU/L — ABNORMAL HIGH (ref 44–121)
BUN/Creatinine Ratio: 13 (ref 9–23)
BUN: 17 mg/dL (ref 6–24)
Bilirubin Total: 0.2 mg/dL (ref 0.0–1.2)
CO2: 23 mmol/L (ref 20–29)
Calcium: 9.5 mg/dL (ref 8.7–10.2)
Chloride: 104 mmol/L (ref 96–106)
Creatinine, Ser: 1.32 mg/dL — ABNORMAL HIGH (ref 0.57–1.00)
Globulin, Total: 2.6 g/dL (ref 1.5–4.5)
Glucose: 83 mg/dL (ref 65–99)
Potassium: 3.8 mmol/L (ref 3.5–5.2)
Sodium: 144 mmol/L (ref 134–144)
Total Protein: 7.4 g/dL (ref 6.0–8.5)
eGFR: 48 mL/min/{1.73_m2} — ABNORMAL LOW (ref >59)

## 2020-10-04 LAB — 10-HYDROXYCARBAZEPINE: Oxcarbazepine SerPl-Mcnc: <1 ug/mL — ABNORMAL LOW (ref 10–35)

## 2020-10-04 MED ORDER — UBRELVY 100 MG PO TABS: ORAL_TABLET | ORAL | 11 refills | Status: DC

## 2020-10-04 NOTE — Telephone Encounter (Signed)
Changed Kim [name] amount that insurance will cover , Limit of 16 tabs in 30 days, unless do an exception.

## 2020-10-09 ENCOUNTER — Telehealth: Payer: Self-pay | Admitting: *Deleted

## 2020-10-09 NOTE — Telephone Encounter (Signed)
I called pt and relayed that per last labs done, oxcarbamazepine was basicall zero, (but at the time she was not taking due to not sure of dose to take),  Her Creatnine and alk phos levels elevated.  Recommend to follow up with pcp in 2 months for evalu and repeat labs.  She appreciated call back. She is doing well now.  She has not drank or marijuana for the last 3 years.  I will fax note to pcp for them to be aware of.  Fax confirmation received Dr. Schultz [phone], [phone] fx 

## 2020-10-09 NOTE — Telephone Encounter (Signed)
-----   Message from Melvenia Beam, MD sent at 10/05/2020  3:52 PM EDT ----- AED level is bascially 0, but the fact that she wasn't taking it explains that. Creatinine level/kidney function and alkphos have worsened and appear different than baseline. She should follow up with primary care within the next few months for recheck. Please fax labs to primary care with a note that her creatine is significantly elevated compared to our last baseline and alkphos is elevated too thanks

## 2020-10-16 ENCOUNTER — Telehealth: Payer: Self-pay | Admitting: *Deleted

## 2020-10-16 MED ORDER — UBRELVY 100 MG PO TABS: ORAL_TABLET | ORAL | 2 refills | Status: DC

## 2020-10-16 NOTE — Telephone Encounter (Signed)
I spoke to [place] at [facility] [phone]) to clarify the patient's migraine rescue medication. She has previously been on sumatriptan and rizatriptan. She is currently getting Ubrelvy 100mg . They have a 90-day rx on file for #24 with refills. They do no put PRN medications in her pill pack. They will send them separately in vials. This way she will not get confused and take them daily.   I also spoke to Petrey who feels the Roselyn Meier is working well for her migraines.

## 2020-10-19 ENCOUNTER — Telehealth: Payer: Self-pay | Admitting: Neurology

## 2020-10-19 MED ORDER — OXCARBAZEPINE 300 MG PO TABS: 300.0000 mg | ORAL_TABLET | Freq: Two times a day (BID) | ORAL | 11 refills | Status: DC

## 2020-10-19 NOTE — Telephone Encounter (Signed)
Spoke to [place], at [facility].  Dose from 09-28-20 was trileptal 300mg  po bid.  (redid prescription for a year).  (this was also relayed to him by pt that this was her dose.

## 2020-10-19 NOTE — Telephone Encounter (Signed)
[facility]) called requesting an updated prescription for Oxcarbazepine (TRILEPTAL) 300 MG tablet. Would like a call from the nurse

## 2020-10-25 DIAGNOSIS — R69 Illness, unspecified: Secondary | ICD-10-CM | POA: Diagnosis not present

## 2020-11-07 ENCOUNTER — Ambulatory Visit: Payer: Medicare Other | Admitting: Neurology

## 2020-11-14 DIAGNOSIS — S80212A Abrasion, left knee, initial encounter: Secondary | ICD-10-CM | POA: Diagnosis not present

## 2020-11-14 DIAGNOSIS — S1093XA Contusion of unspecified part of neck, initial encounter: Secondary | ICD-10-CM | POA: Diagnosis not present

## 2020-11-14 DIAGNOSIS — R109 Unspecified abdominal pain: Secondary | ICD-10-CM | POA: Diagnosis not present

## 2020-11-14 DIAGNOSIS — S3991XA Unspecified injury of abdomen, initial encounter: Secondary | ICD-10-CM | POA: Diagnosis not present

## 2020-11-14 DIAGNOSIS — M542 Cervicalgia: Secondary | ICD-10-CM | POA: Diagnosis not present

## 2020-11-14 DIAGNOSIS — S3993XA Unspecified injury of pelvis, initial encounter: Secondary | ICD-10-CM | POA: Diagnosis not present

## 2020-11-14 DIAGNOSIS — R0781 Pleurodynia: Secondary | ICD-10-CM | POA: Diagnosis not present

## 2020-11-14 DIAGNOSIS — M25511 Pain in right shoulder: Secondary | ICD-10-CM | POA: Diagnosis not present

## 2020-11-14 DIAGNOSIS — Z043 Encounter for examination and observation following other accident: Secondary | ICD-10-CM | POA: Diagnosis not present

## 2020-11-14 DIAGNOSIS — S40011A Contusion of right shoulder, initial encounter: Secondary | ICD-10-CM | POA: Diagnosis not present

## 2020-11-14 DIAGNOSIS — S59912A Unspecified injury of left forearm, initial encounter: Secondary | ICD-10-CM | POA: Diagnosis not present

## 2020-11-14 DIAGNOSIS — R569 Unspecified convulsions: Secondary | ICD-10-CM | POA: Diagnosis not present

## 2020-11-14 DIAGNOSIS — M19011 Primary osteoarthritis, right shoulder: Secondary | ICD-10-CM | POA: Diagnosis not present

## 2020-11-14 DIAGNOSIS — S59911A Unspecified injury of right forearm, initial encounter: Secondary | ICD-10-CM | POA: Diagnosis not present

## 2020-11-14 DIAGNOSIS — S80211A Abrasion, right knee, initial encounter: Secondary | ICD-10-CM | POA: Diagnosis not present

## 2020-11-14 DIAGNOSIS — S2241XA Multiple fractures of ribs, right side, initial encounter for closed fracture: Secondary | ICD-10-CM | POA: Diagnosis not present

## 2020-11-14 DIAGNOSIS — S2231XA Fracture of one rib, right side, initial encounter for closed fracture: Secondary | ICD-10-CM | POA: Diagnosis not present

## 2020-11-14 DIAGNOSIS — W109XXA Fall (on) (from) unspecified stairs and steps, initial encounter: Secondary | ICD-10-CM | POA: Diagnosis not present

## 2020-11-14 DIAGNOSIS — S50819A Abrasion of unspecified forearm, initial encounter: Secondary | ICD-10-CM | POA: Diagnosis not present

## 2020-11-14 DIAGNOSIS — S20211A Contusion of right front wall of thorax, initial encounter: Secondary | ICD-10-CM | POA: Diagnosis not present

## 2020-11-14 DIAGNOSIS — S4991XA Unspecified injury of right shoulder and upper arm, initial encounter: Secondary | ICD-10-CM | POA: Diagnosis not present

## 2020-12-20 DIAGNOSIS — R69 Illness, unspecified: Secondary | ICD-10-CM | POA: Diagnosis not present

## 2021-01-02 ENCOUNTER — Ambulatory Visit: Payer: Medicare HMO | Admitting: Neurology

## 2021-01-19 DIAGNOSIS — R69 Illness, unspecified: Secondary | ICD-10-CM | POA: Diagnosis not present

## 2021-01-22 ENCOUNTER — Telehealth: Payer: Self-pay | Admitting: Neurology

## 2021-01-22 NOTE — Telephone Encounter (Signed)
Returned patient's call.  She is upset that she has gained 30 pounds and wants to go off of her meds or change to medications that will help her lose weight, or can you give her a diet pill to help her lose weight.  Meds prescribed by

## 2021-01-22 NOTE — Telephone Encounter (Signed)
Pt called wanting to know if she can switch out some of her med's where the side affects are making her gain weight. She states she has gained 30 pounds and if she continues to gain weight she is going to stop taking them. Pt requesting a call back.

## 2021-01-22 NOTE — Telephone Encounter (Signed)
Medications currently prescribed by this office is: Topamax, Ubrelvy, Requip, Trileptal, Ajovy and Onfi.    She is aware Dr. [name] is out of the office until tomorrow and will wait for Dr. Rhea Belton recommendations.

## 2021-01-23 NOTE — Telephone Encounter (Signed)
Chart reviewed, none of Rx from our office cause significant weight gain as side effect, Topamax is actually used as weight loss agents.  Requip xr '2mg'$  qhs for restless leg? If she sleeps well, may consider stop Requip.  She should continues all her seizure medications.  Encourage her discuss with her PCP for weight control

## 2021-01-24 NOTE — Telephone Encounter (Signed)
Spoke to patient on the phone. Discussed Dr. Rhea Belton review of her medications that were prescribed by this office.  Also reinforced the importance of staying on her seizure medications.     Patient denied further questions, verbalized understanding and expressed appreciation for the phone call.

## 2021-01-26 DIAGNOSIS — R635 Abnormal weight gain: Secondary | ICD-10-CM | POA: Diagnosis not present

## 2021-01-30 DIAGNOSIS — Z0100 Encounter for examination of eyes and vision without abnormal findings: Secondary | ICD-10-CM | POA: Diagnosis not present

## 2021-01-30 DIAGNOSIS — H3561 Retinal hemorrhage, right eye: Secondary | ICD-10-CM | POA: Diagnosis not present

## 2021-02-14 ENCOUNTER — Other Ambulatory Visit: Payer: Self-pay | Admitting: Neurology

## 2021-02-14 ENCOUNTER — Other Ambulatory Visit: Payer: Self-pay | Admitting: Emergency Medicine

## 2021-02-14 MED ORDER — CLOBAZAM 10 MG PO TABS: 10.0000 mg | ORAL_TABLET | Freq: Two times a day (BID) | ORAL | 5 refills | Status: DC

## 2021-02-14 MED ORDER — CLOBAZAM 10 MG PO TABS
10.0000 mg | ORAL_TABLET | Freq: Two times a day (BID) | ORAL | 5 refills | Status: DC
Start: 2021-02-14 — End: 2021-02-14

## 2021-02-14 NOTE — Telephone Encounter (Signed)
[facility]) request refill cloBAZam (ONFI) 10 MG tablet at [facility]

## 2021-03-02 DIAGNOSIS — G43909 Migraine, unspecified, not intractable, without status migrainosus: Secondary | ICD-10-CM | POA: Diagnosis not present

## 2021-03-02 DIAGNOSIS — R69 Illness, unspecified: Secondary | ICD-10-CM | POA: Diagnosis not present

## 2021-03-13 ENCOUNTER — Telehealth: Payer: Self-pay | Admitting: Neurology

## 2021-03-13 NOTE — Telephone Encounter (Signed)
I called the patient. She has been rescheduled for tomorrow and will check in at 1:30pm. Megan (our VNS rep) will also attend the appt. The patient needs to have her generator interrogated to check her battery life. She is close to needing her VNS replaced.

## 2021-03-13 NOTE — Telephone Encounter (Signed)
Pt called states she did not cancel her appt that was scheduled for 03/14/2021 with Dr. [name]. Pt states her husband has took the day off tomorrow to bring her. Does Dr. [name] have a possible opening tomorrow to work this pt in?

## 2021-03-14 ENCOUNTER — Encounter: Payer: Self-pay | Admitting: Neurology

## 2021-03-14 ENCOUNTER — Ambulatory Visit: Payer: Medicare HMO | Admitting: Neurology

## 2021-03-14 VITALS — BP 115/71 | HR 54 | Ht 66.0 in | Wt 157.0 lb

## 2021-03-14 DIAGNOSIS — G40209 Localization-related (focal) (partial) symptomatic epilepsy and epileptic syndromes with complex partial seizures, not intractable, without status epilepticus: Secondary | ICD-10-CM | POA: Diagnosis not present

## 2021-03-14 DIAGNOSIS — G43009 Migraine without aura, not intractable, without status migrainosus: Secondary | ICD-10-CM | POA: Diagnosis not present

## 2021-03-14 DIAGNOSIS — Z79899 Other long term (current) drug therapy: Secondary | ICD-10-CM

## 2021-03-14 DIAGNOSIS — R55 Syncope and collapse: Secondary | ICD-10-CM

## 2021-03-14 DIAGNOSIS — G40219 Localization-related (focal) (partial) symptomatic epilepsy and epileptic syndromes with complex partial seizures, intractable, without status epilepticus: Secondary | ICD-10-CM | POA: Diagnosis not present
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
## 2021-03-16 LAB — CBC WITH DIFFERENTIAL/PLATELET
Basophils Absolute: 0.1 10*3/uL (ref 0.0–0.2)
Basos: 1 %
EOS (ABSOLUTE): 0.3 10*3/uL (ref 0.0–0.4)
Eos: 5 %
Hematocrit: 36.9 % (ref 34.0–46.6)
Hemoglobin: 12.4 g/dL (ref 11.1–15.9)
Immature Grans (Abs): 0 10*3/uL (ref 0.0–0.1)
Immature Granulocytes: 0 %
Lymphocytes Absolute: 3.1 10*3/uL (ref 0.7–3.1)
Lymphs: 47 %
MCH: 32.4 pg (ref 26.6–33.0)
MCHC: 33.6 g/dL (ref 31.5–35.7)
MCV: 96 fL (ref 79–97)
Monocytes Absolute: 0.6 10*3/uL (ref 0.1–0.9)
Monocytes: 10 %
Neutrophils Absolute: 2.4 10*3/uL (ref 1.4–7.0)
Neutrophils: 37 %
Platelets: 178 10*3/uL (ref 150–450)
RBC: 3.83 x10E6/uL (ref 3.77–5.28)
RDW: 13.6 % (ref 11.7–15.4)
WBC: 6.5 10*3/uL (ref 3.4–10.8)

## 2021-03-16 LAB — TSH: TSH: 4.78 u[IU]/mL — ABNORMAL HIGH (ref 0.450–4.500)

## 2021-03-16 LAB — COMPREHENSIVE METABOLIC PANEL
ALT: 15 IU/L (ref 0–32)
AST: 21 IU/L (ref 0–40)
Albumin/Globulin Ratio: 2.3 — ABNORMAL HIGH (ref 1.2–2.2)
Albumin: 4.8 g/dL (ref 3.8–4.9)
Alkaline Phosphatase: 131 IU/L — ABNORMAL HIGH (ref 44–121)
BUN/Creatinine Ratio: 12 (ref 9–23)
BUN: 17 mg/dL (ref 6–24)
Bilirubin Total: <0.2 mg/dL (ref 0.0–1.2)
CO2: 20 mmol/L (ref 20–29)
Calcium: 9.2 mg/dL (ref 8.7–10.2)
Chloride: 101 mmol/L (ref 96–106)
Creatinine, Ser: 1.39 mg/dL — ABNORMAL HIGH (ref 0.57–1.00)
Globulin, Total: 2.1 g/dL (ref 1.5–4.5)
Glucose: 86 mg/dL (ref 70–99)
Potassium: 4 mmol/L (ref 3.5–5.2)
Sodium: 137 mmol/L (ref 134–144)
Total Protein: 6.9 g/dL (ref 6.0–8.5)
eGFR: 45 mL/min/{1.73_m2} — ABNORMAL LOW (ref >59)

## 2021-03-16 LAB — VITAMIN B12: Vitamin B-12: 320 pg/mL (ref 232–1245)

## 2021-03-16 LAB — HGB A1C W/O EAG: Hgb A1c MFr Bld: 5.3 % (ref 4.8–5.6)

## 2021-03-16 LAB — 10-HYDROXYCARBAZEPINE: Oxcarbazepine SerPl-Mcnc: 19 ug/mL (ref 10–35)

## 2021-03-16 LAB — TOPIRAMATE LEVEL: Topiramate Lvl: 14.1 ug/mL (ref 2.0–25.0)

## 2021-03-16 IMAGING — CT CT HEAD WITHOUT CONTRAST
3 series · 16 of 47 positions shown, 19 images · non-contrast
Comparison: None.

CLINICAL DATA: 53 y/o F; ataxia, confusion, drowsiness. History of
seizures, migraines, depression.

EXAM:
CT HEAD WITHOUT CONTRAST
TECHNIQUE: Contiguous axial images were obtained from the base of the skull
through the vertex without intravenous contrast.

[Series 2: head wo · axial · 0.45mm/px · z∈[-132,-7]mm · 10 of 30 slices shown, 13 images]
[im 3/30  brain]
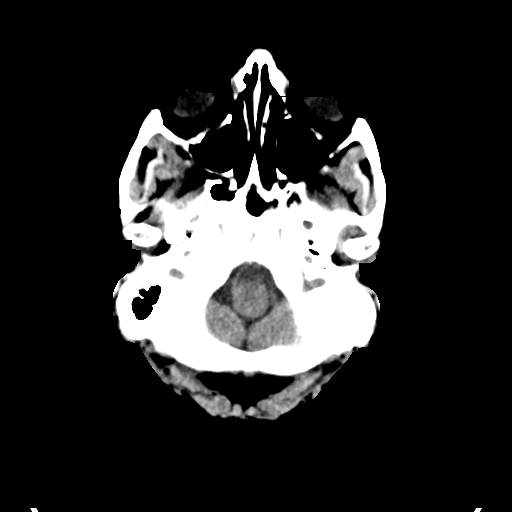
[im 3/30  bone]
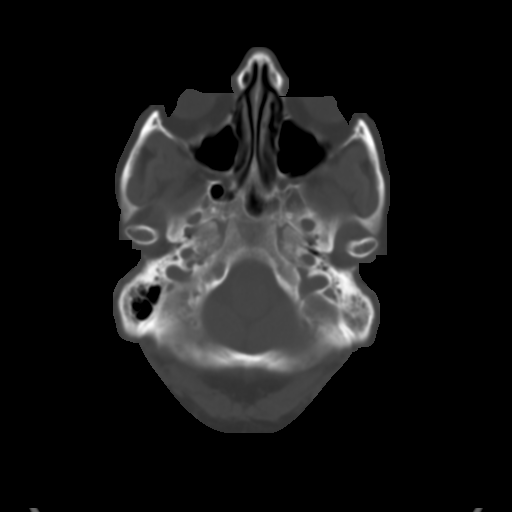
[im 6/30  brain]
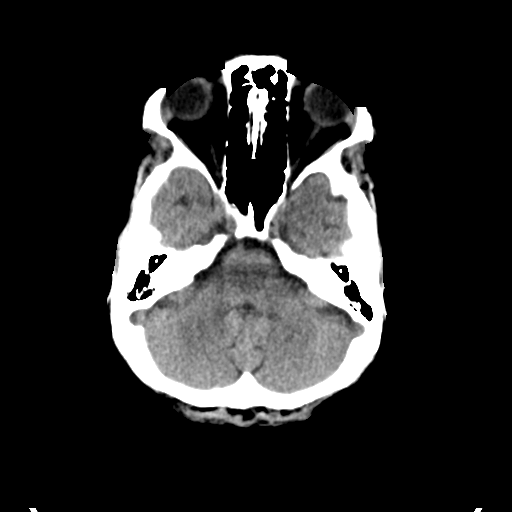
[im 9/30  brain]
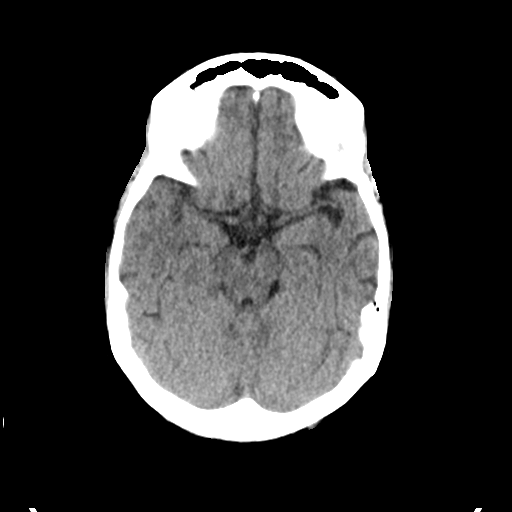
[im 11/30  brain]
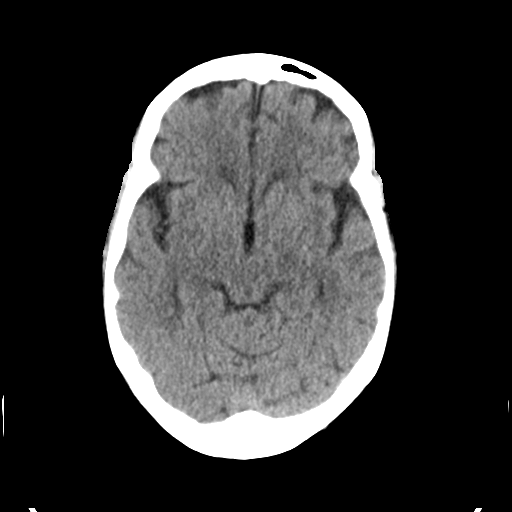
[im 14/30  brain]
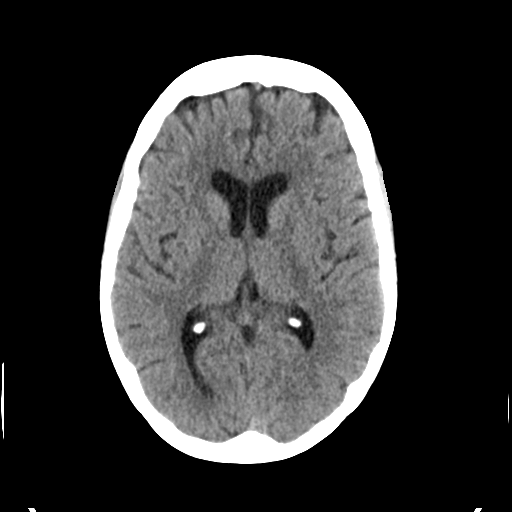
[im 14/30  bone]
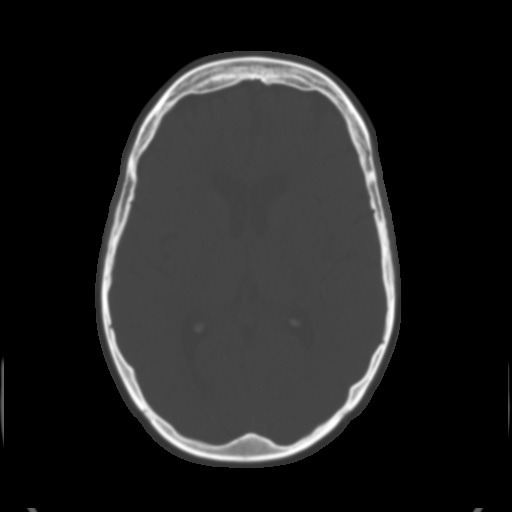
[im 17/30  brain]
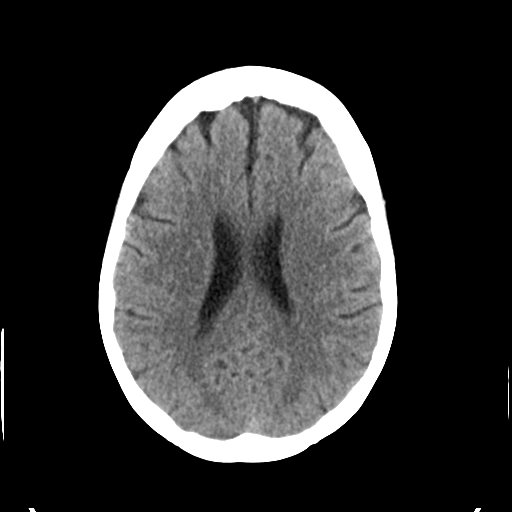
[im 20/30  brain]
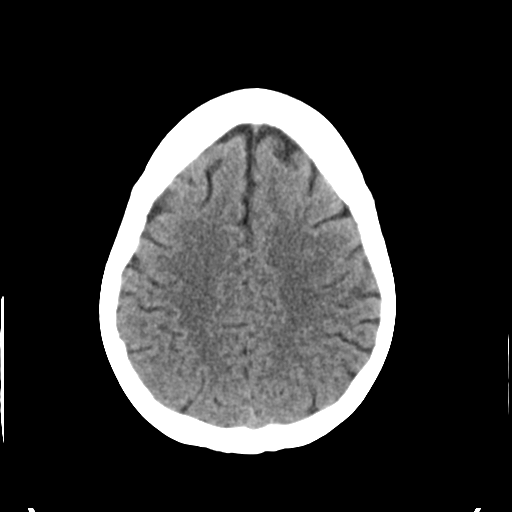
[im 23/30  brain]
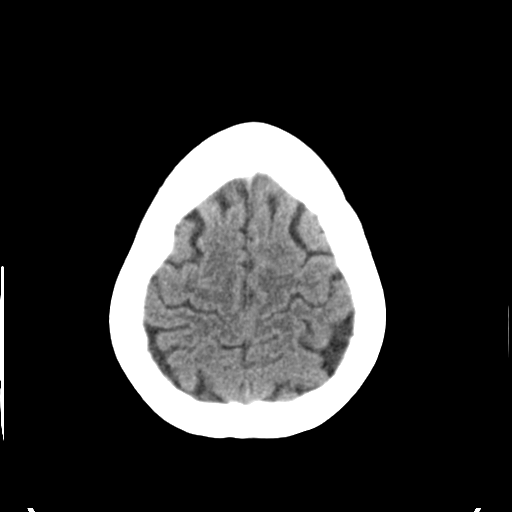
[im 25/30  brain]
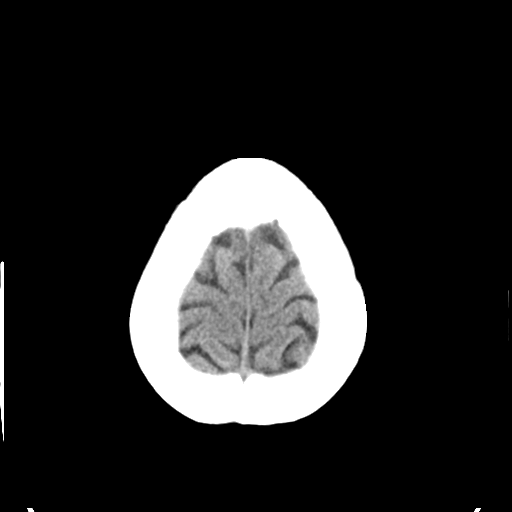
[im 25/30  bone]
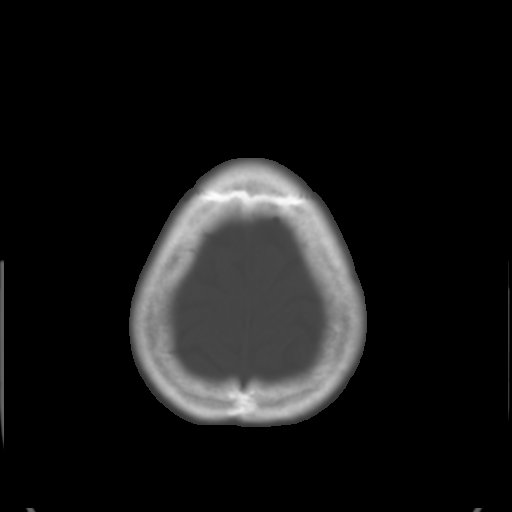
[im 28/30  brain]
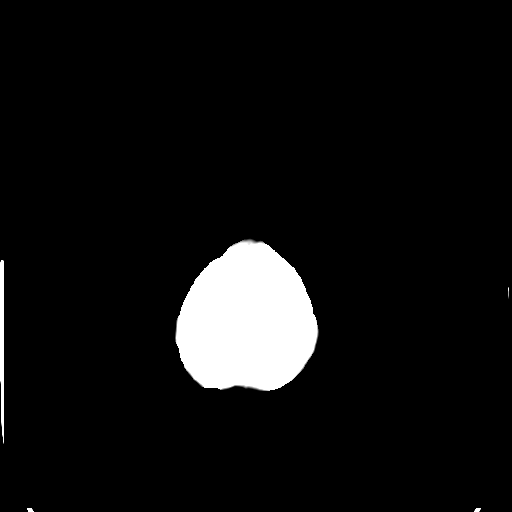

[Series 4: coronal soft tissue · coronal · 0.29mm/px · 3 of 62 slices shown]
[im 21/62  brain]
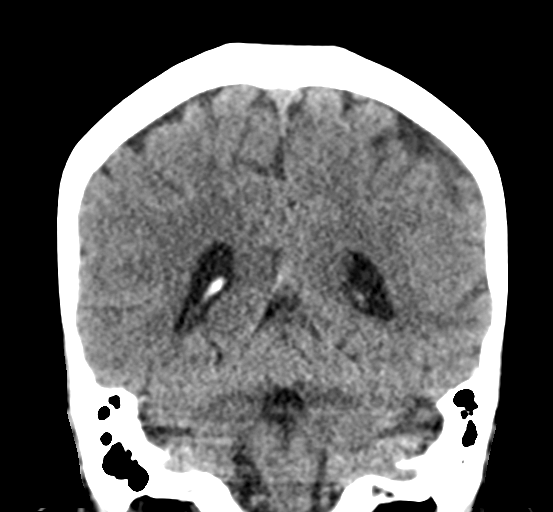
[im 28/62  brain]
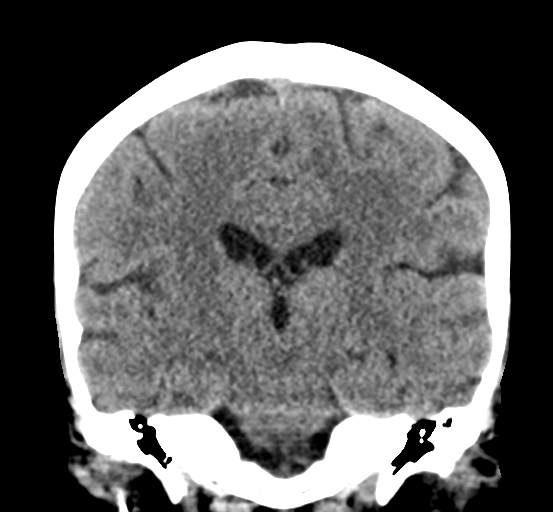
[im 34/62  brain]
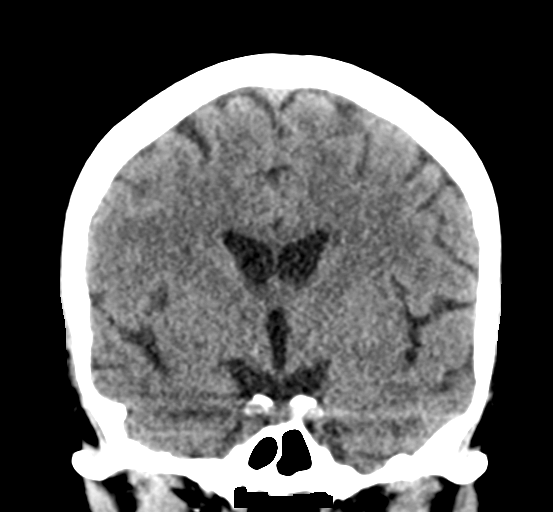

[Series 5: sagittal soft tissue · sagittal · 0.29mm/px · 3 of 47 slices shown]
[im 16/47  brain]
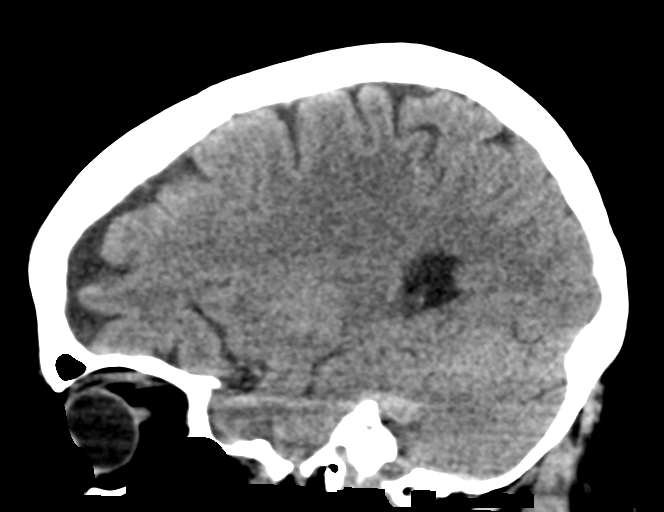
[im 24/47  brain]
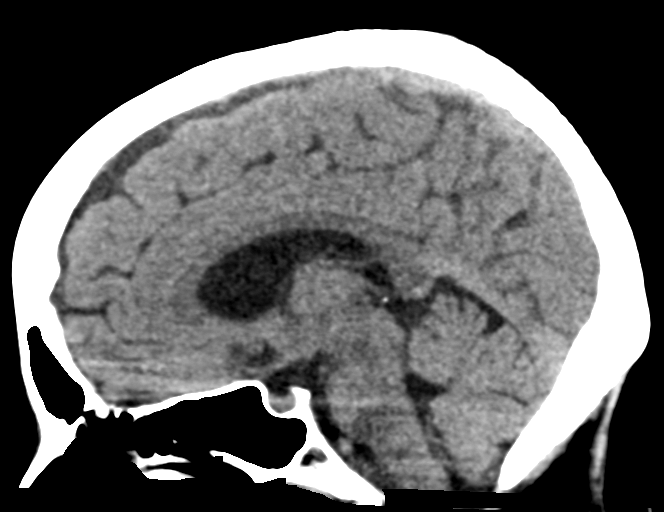
[im 31/47  brain]
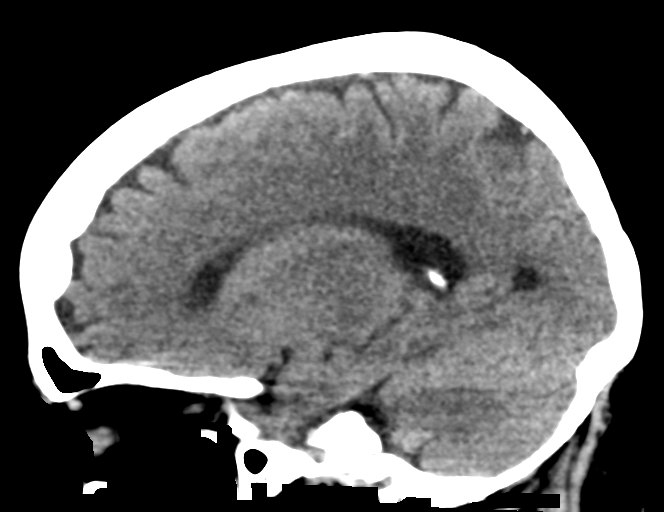

[16 of 47 positions shown; findings below may reference images not displayed]

FINDINGS: Brain: No evidence of acute infarction, hemorrhage, hydrocephalus,
extra-axial collection or mass lesion/mass effect.

Vascular: No hyperdense vessel or unexpected calcification.

Skull: Normal. Negative for fracture or focal lesion.

Sinuses/Orbits: No acute finding.

Other: None.
IMPRESSION: No acute intracranial abnormality identified. Unremarkable CT of the
head.

## 2021-03-19 ENCOUNTER — Telehealth: Payer: Self-pay | Admitting: Neurology

## 2021-03-19 NOTE — Telephone Encounter (Signed)
Please call patient, laboratory evaluation showed slight elevated creatinine, indicating mild abnormal kidney function, his GFR of 45  This is about her baseline she would benefit increase water intake, mild elevated alkaline phosphate, actually, improved compared to previous testing April 2022  Mild elevated TSH 4.7, this potentially indicate hypothyroid functional test, I forward the laboratory evaluation to her primary care Nicoletta Dress, MD, she may contact him for further evaluation  Trileptal level 19, and Topamax 14.1 within therapeutic range

## 2021-03-20 IMAGING — US ULTRASOUND ABDOMEN LIMITED
1 series · 14 of 25 positions shown · non-contrast
Comparison: None.

CLINICAL DATA: 53-year-old female with cirrhosis

EXAM:
ULTRASOUND ABDOMEN LIMITED RIGHT UPPER QUADRANT

[Series 1: ultrasound abdomen limited · 14 of 47 slices shown]
[im 1/47]
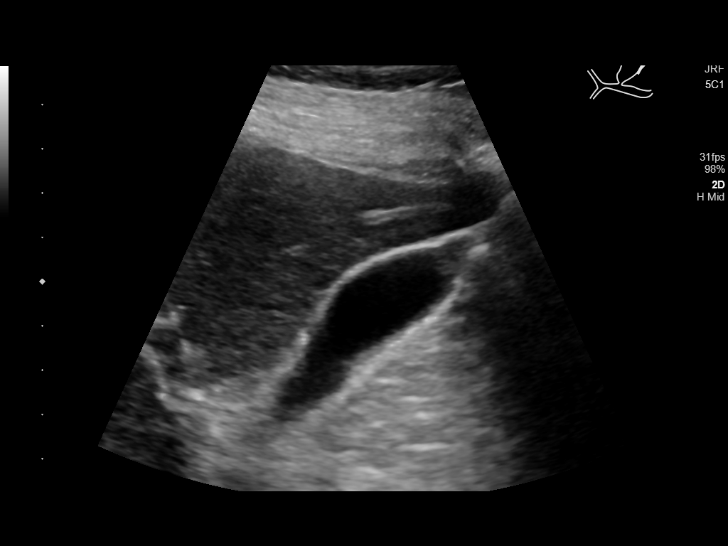
[im 4/47]
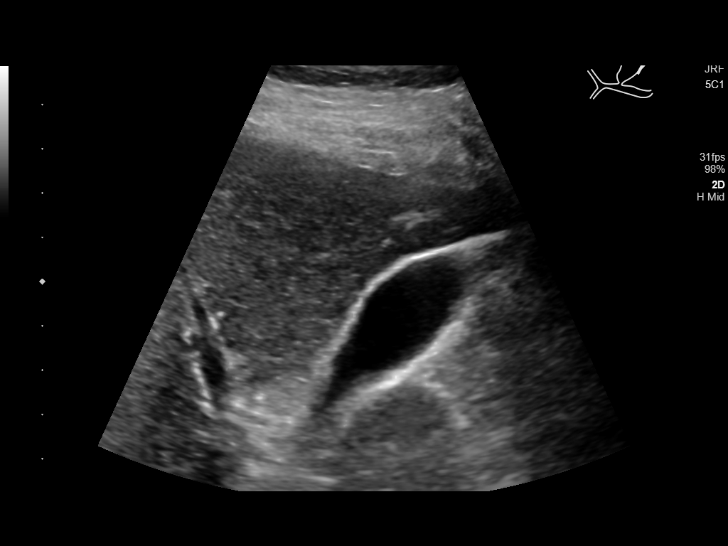
[im 8/47]
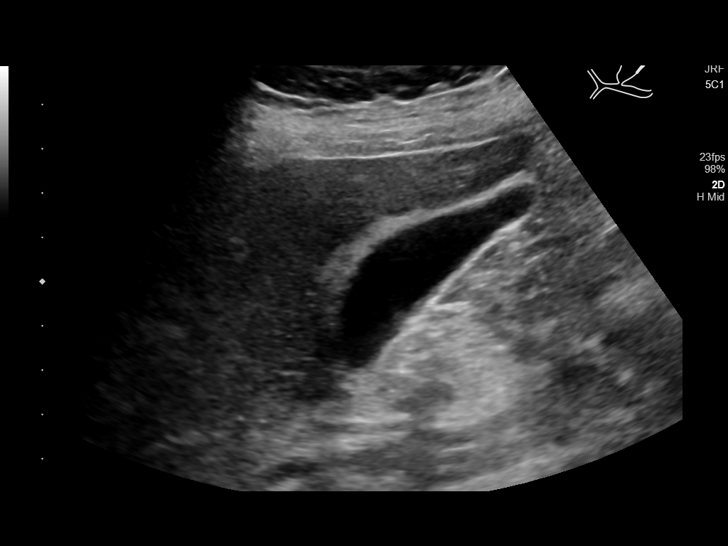
[im 12/47]
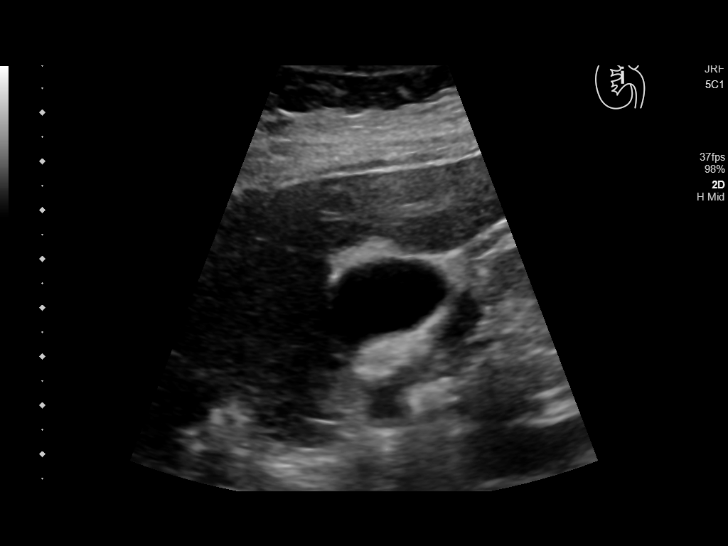
[im 16/47]
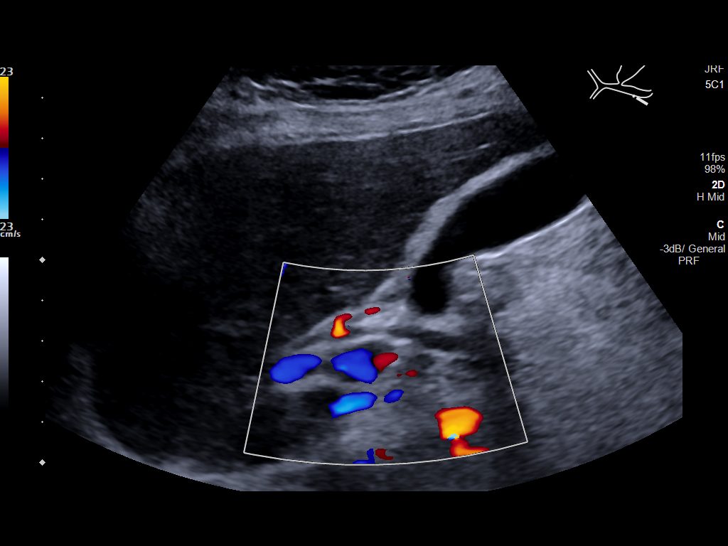
[im 18/47]
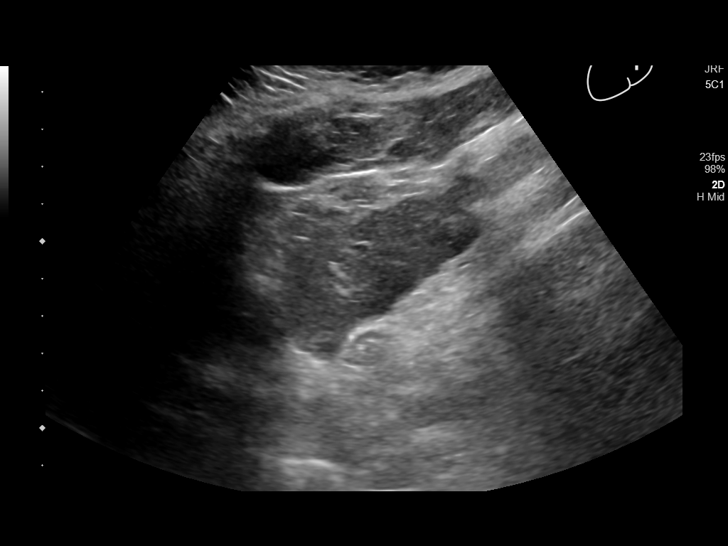
[im 22/47]
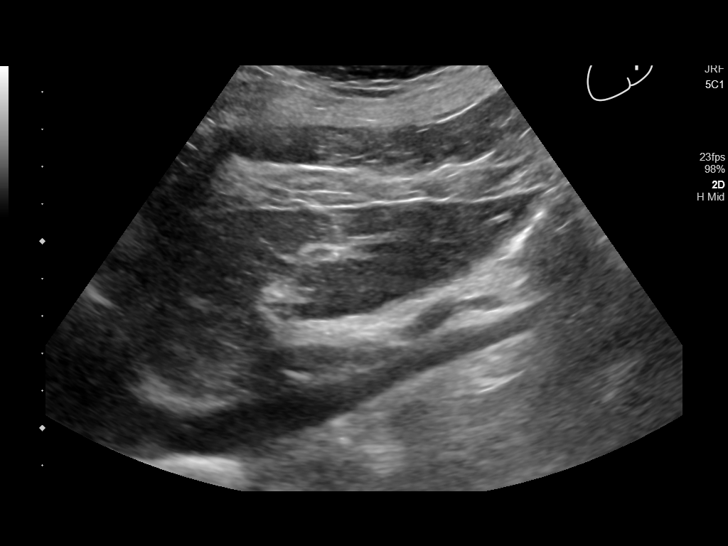
[im 25/47]
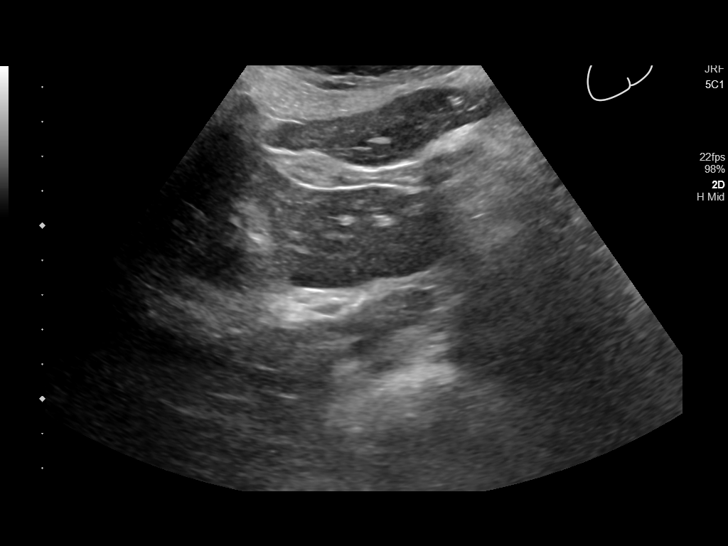
[im 29/47]
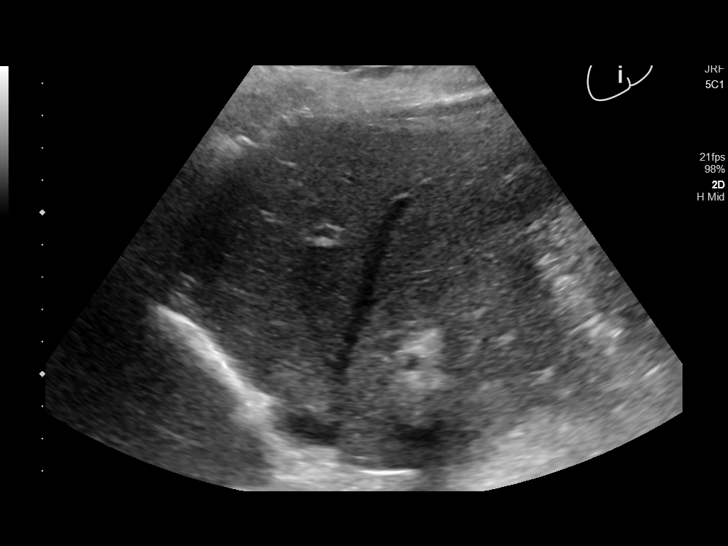
[im 31/47]
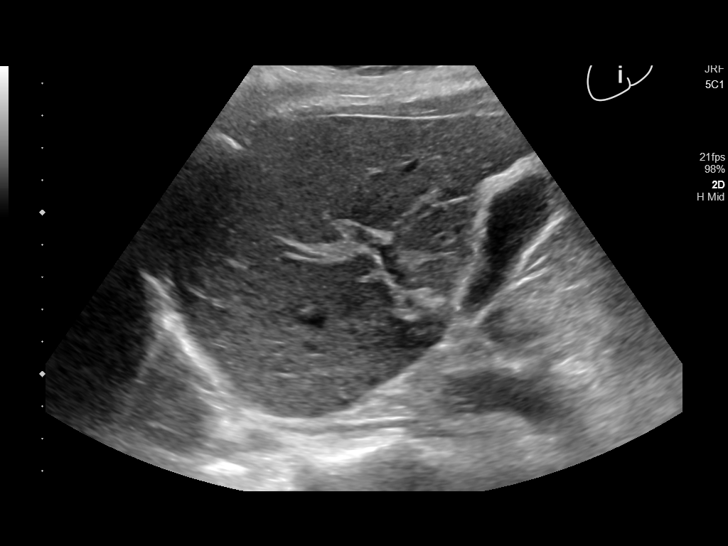
[im 35/47]
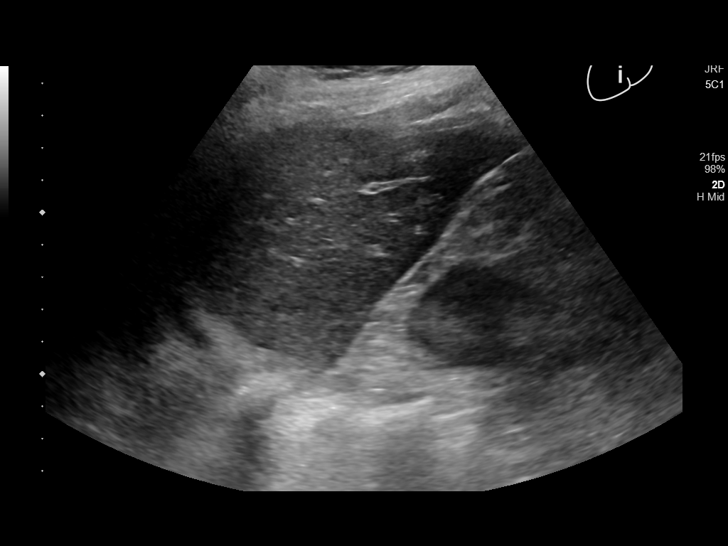
[im 39/47]
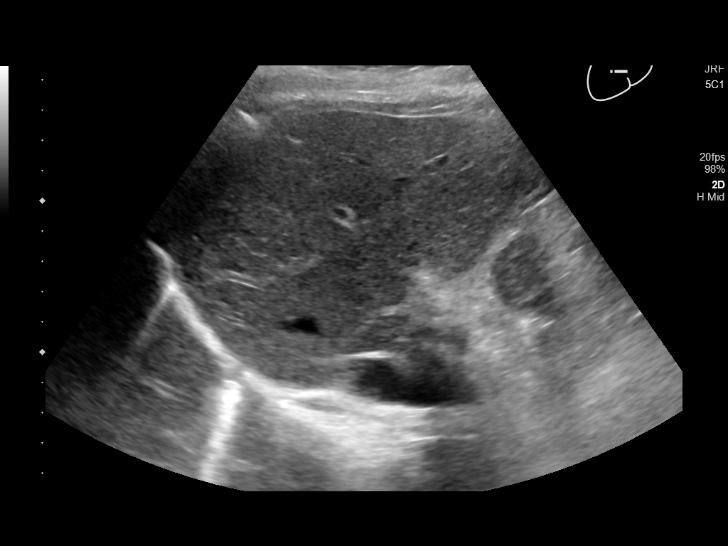
[im 43/47]
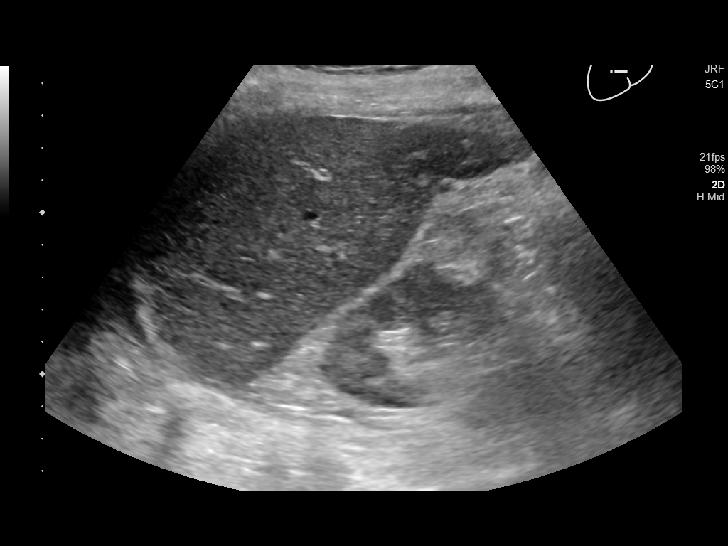
[im 47/47]
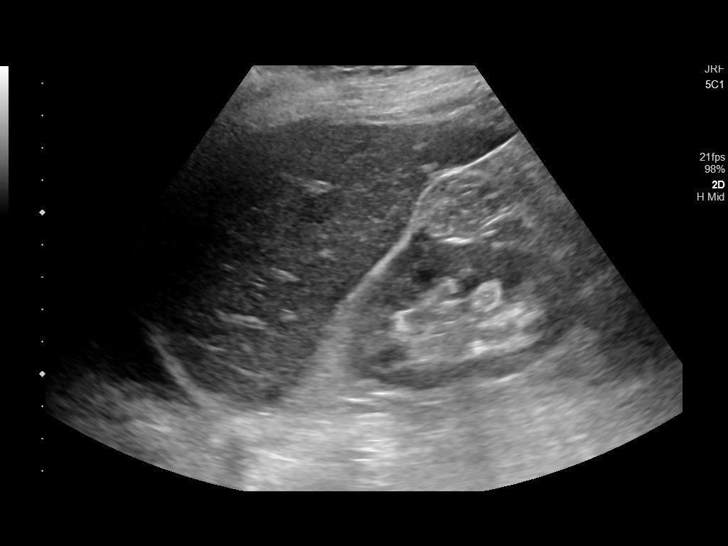

[14 of 25 positions shown; findings below may reference images not displayed]

FINDINGS: Gallbladder:

No gallstones or wall thickening visualized. No sonographic Murphy
sign noted by sonographer.

Common bile duct:

Diameter: 2 mm-3 mm

Liver:

No focal lesion of the liver. No significantly nodular contour of
the liver. Unremarkable liver echotexture. Portal vein is patent on
color Doppler imaging with normal direction of blood flow towards
the liver.

Incidental right pleural fluid.
IMPRESSION: No overt signs of cirrhosis on the ultrasound.

Unremarkable gallbladder.

Trace right pleural effusion.

## 2021-03-20 NOTE — Telephone Encounter (Signed)
I called patient. I discussed her lab results and recommendations. She will follow up with PCP Dr. Olean Ree. Pt verbalized understanding of results. Pt had no questions at this time but was encouraged to call back if questions arise.

## 2021-03-26 NOTE — Telephone Encounter (Signed)
Returned patient's call.  She was asking for medication to manage her thyroid.  Explained to patient that is why we sent the lab results to her PCP, as we do not manage that medication.  She said she has spoke to PCP this morning and he is going to call her back.  Patient denied further questions, verbalized understanding and expressed appreciation for the phone call.

## 2021-03-26 NOTE — Telephone Encounter (Signed)
Pt called states she followed up with her PCP and he informed her that her thyroid levels seemed normal to him. Pt wanting to know if Dr. [name] will prescribe her a medication for it since the PCP will not. Pt requesting a call back.

## 2021-03-29 DIAGNOSIS — Z87891 Personal history of nicotine dependence: Secondary | ICD-10-CM | POA: Diagnosis not present

## 2021-03-29 DIAGNOSIS — I10 Essential (primary) hypertension: Secondary | ICD-10-CM | POA: Diagnosis not present

## 2021-03-29 DIAGNOSIS — Z809 Family history of malignant neoplasm, unspecified: Secondary | ICD-10-CM | POA: Diagnosis not present

## 2021-03-29 DIAGNOSIS — F419 Anxiety disorder, unspecified: Secondary | ICD-10-CM | POA: Diagnosis not present

## 2021-03-29 DIAGNOSIS — R609 Edema, unspecified: Secondary | ICD-10-CM | POA: Diagnosis not present

## 2021-03-29 DIAGNOSIS — G43909 Migraine, unspecified, not intractable, without status migrainosus: Secondary | ICD-10-CM | POA: Diagnosis not present

## 2021-03-29 DIAGNOSIS — G2581 Restless legs syndrome: Secondary | ICD-10-CM | POA: Diagnosis not present

## 2021-03-29 DIAGNOSIS — F319 Bipolar disorder, unspecified: Secondary | ICD-10-CM | POA: Diagnosis not present

## 2021-03-29 DIAGNOSIS — Z20822 Contact with and (suspected) exposure to covid-19: Secondary | ICD-10-CM | POA: Diagnosis not present

## 2021-03-29 DIAGNOSIS — E785 Hyperlipidemia, unspecified: Secondary | ICD-10-CM | POA: Diagnosis not present

## 2021-03-29 DIAGNOSIS — Z8249 Family history of ischemic heart disease and other diseases of the circulatory system: Secondary | ICD-10-CM | POA: Diagnosis not present

## 2021-03-29 DIAGNOSIS — R69 Illness, unspecified: Secondary | ICD-10-CM | POA: Diagnosis not present

## 2021-03-29 DIAGNOSIS — K219 Gastro-esophageal reflux disease without esophagitis: Secondary | ICD-10-CM | POA: Diagnosis not present

## 2021-05-04 DIAGNOSIS — F332 Major depressive disorder, recurrent severe without psychotic features: Secondary | ICD-10-CM | POA: Diagnosis not present

## 2021-05-04 DIAGNOSIS — R69 Illness, unspecified: Secondary | ICD-10-CM | POA: Diagnosis not present

## 2021-05-23 ENCOUNTER — Telehealth: Payer: Self-pay | Admitting: Neurology

## 2021-05-23 NOTE — Telephone Encounter (Signed)
Pt called to inform provider that starting in Jan her insurance will not cover her Ubrogepant (UBRELVY) 100 MG TABS and she would like to know if another medication can replace it. Please advise.

## 2021-05-23 NOTE — Telephone Encounter (Signed)
I returned the call to the patient. She has already tried sumatriptan and rizatriptan in the past. She was prescribed Nurtec but it was non-formulary in 2022 for her plan. She does not know which medications are approved for 2023 yet. It may be that Roselyn Meier will just need a prior authorization. It also may be that Nurtec will be allowed in 2023.   She is eligible for a refill this month and will be picking up a 90-day supply today. We will have to re-evaluate closer to her next refill date since the actual new plan will be active then. She verbalized understanding.

## 2021-06-12 ENCOUNTER — Other Ambulatory Visit: Payer: Self-pay | Admitting: Neurology

## 2021-06-20 DIAGNOSIS — M5432 Sciatica, left side: Secondary | ICD-10-CM | POA: Diagnosis not present

## 2021-06-25 DIAGNOSIS — G43909 Migraine, unspecified, not intractable, without status migrainosus: Secondary | ICD-10-CM | POA: Diagnosis not present

## 2021-06-25 DIAGNOSIS — R519 Headache, unspecified: Secondary | ICD-10-CM | POA: Diagnosis not present

## 2021-06-25 NOTE — Progress Notes (Signed)
Received PA request for ubrelvy on CMM. Attempted PA. Received this response from [employer]: "The patient currently has access to the requested medication and a Prior Authorization is not needed for the patient/medication."  Key: [id number]

## 2021-09-12 ENCOUNTER — Ambulatory Visit (INDEPENDENT_AMBULATORY_CARE_PROVIDER_SITE_OTHER): Payer: Medicare HMO | Admitting: Neurology

## 2021-09-12 VITALS — BP 147/85 | HR 75 | Ht 66.0 in | Wt 162.0 lb

## 2021-09-12 DIAGNOSIS — G40209 Localization-related (focal) (partial) symptomatic epilepsy and epileptic syndromes with complex partial seizures, not intractable, without status epilepticus: Secondary | ICD-10-CM

## 2021-09-12 MED ORDER — AIMOVIG 140 MG/ML ~~LOC~~ SOAJ: 140.0000 mg | SUBCUTANEOUS | 11 refills | Status: DC

## 2021-09-12 MED ORDER — UBRELVY 100 MG PO TABS: ORAL_TABLET | ORAL | 2 refills | Status: DC
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
## 2021-09-12 NOTE — Patient Instructions (Signed)
Stop the Ajovy, switch to [place] for headache prevention  ?Check labs today   ?Return back in 4 months with Dr. [name]  ?

## 2021-09-13 ENCOUNTER — Telehealth: Payer: Self-pay | Admitting: Neurology

## 2021-09-13 DIAGNOSIS — G40209 Localization-related (focal) (partial) symptomatic epilepsy and epileptic syndromes with complex partial seizures, not intractable, without status epilepticus: Secondary | ICD-10-CM

## 2021-09-13 DIAGNOSIS — R55 Syncope and collapse: Secondary | ICD-10-CM

## 2021-09-13 DIAGNOSIS — Z79899 Other long term (current) drug therapy: Secondary | ICD-10-CM

## 2021-09-13 LAB — COMPREHENSIVE METABOLIC PANEL
ALT: 182 IU/L — ABNORMAL HIGH (ref 0–32)
AST: 123 IU/L — ABNORMAL HIGH (ref 0–40)
Albumin/Globulin Ratio: 2 (ref 1.2–2.2)
Albumin: 4.7 g/dL (ref 3.8–4.9)
Alkaline Phosphatase: 142 IU/L — ABNORMAL HIGH (ref 44–121)
BUN/Creatinine Ratio: 16 (ref 9–23)
BUN: 20 mg/dL (ref 6–24)
Bilirubin Total: 0.2 mg/dL (ref 0.0–1.2)
CO2: 18 mmol/L — ABNORMAL LOW (ref 20–29)
Calcium: 9.7 mg/dL (ref 8.7–10.2)
Chloride: 103 mmol/L (ref 96–106)
Creatinine, Ser: 1.29 mg/dL — ABNORMAL HIGH (ref 0.57–1.00)
Globulin, Total: 2.4 g/dL (ref 1.5–4.5)
Glucose: 85 mg/dL (ref 70–99)
Potassium: 3.7 mmol/L (ref 3.5–5.2)
Sodium: 141 mmol/L (ref 134–144)
Total Protein: 7.1 g/dL (ref 6.0–8.5)
eGFR: 49 mL/min/{1.73_m2} — ABNORMAL LOW (ref >59)

## 2021-09-13 MED ORDER — TOPIRAMATE 50 MG PO TABS: ORAL_TABLET | ORAL | 11 refills | Status: DC

## 2021-09-13 NOTE — Telephone Encounter (Addendum)
I called pt and relayed results from ss, NP. She verbalized understanding of results. She reports no change with tylenol intake or ETOH. Sts she has not had a drink in 4 years now. Most recent change is prozac was increased from 40 mg to 80 mg. She is going to try and work out transportation for her to have labs today, but if not today first thing tomorrow morning.  ? ?I have printed labs results and order and faxed to Dr. Delena Bali office.  ?

## 2021-09-13 NOTE — Telephone Encounter (Signed)
CMP resulted, acute change in liver function, AST 123, ALT 182 , Alkaline phos 142. Please call, any reason for this she can explain? ETOH? Tylenol? I would recheck this today, can she go to her PCP for a recheck, this is concerning.  ?

## 2021-09-13 NOTE — Addendum Note (Signed)
Addended by: Verlin Grills on: 09/13/2021 01:12 PM ? ? Modules accepted: Orders ? ?

## 2021-09-15 LAB — 10-HYDROXYCARBAZEPINE: Oxcarbazepine SerPl-Mcnc: 20 ug/mL (ref 10–35)

## 2021-09-17 ENCOUNTER — Telehealth: Payer: Self-pay | Admitting: Neurology

## 2021-09-17 ENCOUNTER — Telehealth: Payer: Self-pay | Admitting: *Deleted

## 2021-09-17 NOTE — Telephone Encounter (Signed)
-----   Message from Suzzanne Cloud, NP sent at 09/17/2021  9:44 AM EDT ----- ?Trileptal level is within therapeutic range. Please check to make sure she had labs done at her PCP to recheck liver enzymes. I would probably hold off on the increase in Topamax dosing until we determine what is going on with the liver, will update her plan once labs return. ?

## 2021-09-17 NOTE — Telephone Encounter (Signed)
Spoke with pt and informed of results. Pt stated she had her liver enzymes checked with PCP and they were still elevated. I informed pt to hold off on increasing the dosage of Topamax until further notice. ?

## 2021-09-17 NOTE — Telephone Encounter (Signed)
Sent to [facility], also sent  Elenor Legato a message to let me know when the patient is scheduled.  ?

## 2021-09-18 ENCOUNTER — Telehealth: Payer: Self-pay | Admitting: Neurology

## 2021-09-18 NOTE — Telephone Encounter (Signed)
I called [facility] at [phone] to get further information on the high co-pay. The pharmacy was closed. We will have to call back. The message did not provide the office hours.  ?

## 2021-09-18 NOTE — Telephone Encounter (Signed)
I called patient. She reports that her PCP recommended a liver US. She is following closely with her PCP. She will continue her same medication dosing for now. ?

## 2021-09-18 NOTE — Telephone Encounter (Signed)
Pt has called to report that the Aimovig is over $700.00 she is asking for a call to discuss other options. ?

## 2021-09-18 NOTE — Telephone Encounter (Signed)
Stay on same doses of medication for now until etiology of liver enzyme elevation determined. Please make sure she follows with PCP for this, is there a plan in place? ?

## 2021-09-19 MED ORDER — AIMOVIG 140 MG/ML ~~LOC~~ SOAJ: 140.0000 mg | SUBCUTANEOUS | 11 refills | Status: DC

## 2021-09-19 NOTE — Addendum Note (Signed)
Addended by: Desmond Lope on: 09/19/2021 04:23 PM ? ? Modules accepted: Orders ? ?

## 2021-09-19 NOTE — Progress Notes (Signed)
Chart reviewed, agree above plan ?

## 2021-09-19 NOTE — Telephone Encounter (Addendum)
I spoke to [place] at [facility] who informed me the patient's [employer] is no longer active. I called the patient who told me she now has [employer] [phone]). The coverage just started on 09/15/21. She will bring a copy of her card by our office. She would like the Aimovig to be sent to [facility]. I informed her to also notify the pharmacy of the new plan. ?

## 2021-09-24 ENCOUNTER — Other Ambulatory Visit: Payer: Self-pay

## 2021-09-24 MED ORDER — OXCARBAZEPINE 300 MG PO TABS: 300.0000 mg | ORAL_TABLET | Freq: Two times a day (BID) | ORAL | 5 refills | Status: DC

## 2021-09-24 MED ORDER — TOPIRAMATE 50 MG PO TABS: ORAL_TABLET | ORAL | 5 refills | Status: DC

## 2021-09-24 NOTE — Progress Notes (Signed)
Spoke with pt to confirm pharmacy due to recent insurance change. [facility] is current pharmacy. ?

## 2021-09-24 NOTE — Telephone Encounter (Signed)
Pt has called back asking the message be sent to Sharyn Lull, RN that her insurance is [facility] and the provider line for [facility] is [phone], they can be called re: her Aimovig.  Please call them. ?

## 2021-09-24 NOTE — Telephone Encounter (Signed)
Per pharmacy, the patient will need a PA for both Aimovig '140mg'$  and Ubrelvy '100mg'$ .  ? ?

## 2021-09-25 ENCOUNTER — Telehealth: Payer: Self-pay | Admitting: Neurology

## 2021-09-25 MED ORDER — UBRELVY 100 MG PO TABS: ORAL_TABLET | ORAL | 2 refills | Status: DC

## 2021-09-25 NOTE — Telephone Encounter (Signed)
Left message letting patient know that both medications have been approved. ?

## 2021-09-25 NOTE — Telephone Encounter (Signed)
Pharmacy coverage through [facility] [phone]). ? ?PA for Aimovig '140mg'$  started on covermymeds (key: [id number]). ZL#[id number] approved through 06/16/22. ? ?PA for Ubrelvy '100mg'$  started on covermymeds (key: [id number]). PA# [id number] approved through 06/16/22. ?

## 2021-09-25 NOTE — Telephone Encounter (Signed)
Rx sent to [facility].  

## 2021-09-25 NOTE — Telephone Encounter (Signed)
Kim [name] with [facility] called to requested refill of Ubrogepant (UBRELVY) 100 MG TABS to go to [facility].  ?

## 2021-09-27 ENCOUNTER — Telehealth: Payer: Self-pay

## 2021-09-27 NOTE — Telephone Encounter (Signed)
PA for topiramate has been sent via cover my meds. ? (Key: [id number]) ? ?Your information has been sent to [facility].  ?

## 2021-09-29 DIAGNOSIS — R945 Abnormal results of liver function studies: Secondary | ICD-10-CM | POA: Diagnosis not present

## 2021-09-29 DIAGNOSIS — R7401 Elevation of levels of liver transaminase levels: Secondary | ICD-10-CM | POA: Diagnosis not present

## 2021-10-01 NOTE — Telephone Encounter (Signed)
PA Case: [id number] approved through 06/16/22. ?

## 2021-10-14 DIAGNOSIS — I10 Essential (primary) hypertension: Secondary | ICD-10-CM | POA: Diagnosis not present

## 2021-10-14 DIAGNOSIS — E785 Hyperlipidemia, unspecified: Secondary | ICD-10-CM | POA: Diagnosis not present

## 2021-10-22 ENCOUNTER — Telehealth: Payer: Self-pay | Admitting: Neurology

## 2021-10-22 NOTE — Telephone Encounter (Signed)
Pt requesting all of her medications prescribed by [place] office to be sent to [facility].  ?

## 2021-10-23 ENCOUNTER — Other Ambulatory Visit: Payer: Self-pay | Admitting: *Deleted

## 2021-10-23 DIAGNOSIS — G40209 Localization-related (focal) (partial) symptomatic epilepsy and epileptic syndromes with complex partial seizures, not intractable, without status epilepticus: Secondary | ICD-10-CM | POA: Diagnosis not present

## 2021-10-23 MED ORDER — CLOBAZAM 10 MG PO TABS: 10.0000 mg | ORAL_TABLET | Freq: Two times a day (BID) | ORAL | 1 refills | Status: DC

## 2021-10-23 MED ORDER — UBRELVY 100 MG PO TABS: ORAL_TABLET | ORAL | 3 refills | Status: DC

## 2021-10-23 MED ORDER — OXCARBAZEPINE 300 MG PO TABS: 300.0000 mg | ORAL_TABLET | Freq: Two times a day (BID) | ORAL | 3 refills | Status: DC

## 2021-10-23 MED ORDER — TOPIRAMATE 50 MG PO TABS: ORAL_TABLET | ORAL | 3 refills | Status: DC

## 2021-10-23 MED ORDER — AIMOVIG 140 MG/ML ~~LOC~~ SOAJ: 140.0000 mg | SUBCUTANEOUS | 3 refills | Status: DC

## 2021-10-23 NOTE — Telephone Encounter (Signed)
Refills have been sent to requested pharmacy.

## 2021-10-24 DIAGNOSIS — G40209 Localization-related (focal) (partial) symptomatic epilepsy and epileptic syndromes with complex partial seizures, not intractable, without status epilepticus: Secondary | ICD-10-CM | POA: Diagnosis not present

## 2021-10-31 ENCOUNTER — Encounter: Payer: Self-pay | Admitting: Neurology

## 2021-10-31 ENCOUNTER — Telehealth: Payer: Self-pay | Admitting: Neurology

## 2021-10-31 NOTE — Telephone Encounter (Signed)
Please call the patient, let her know ambulatory EEG was normal, no seizure activity was seen. She did have 2 push button events, neither correlated with EEG changes.  She should continue current medications, keep next follow up appointment.   ? ?Physician Interpretation: ?This is a normal 24 hours ambulatory video EEG. There were 2 events button press for 'funny felling' with no changes in EEG background. ?A normal EEG does not exclude a diagnosis of epilepsy. ?
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
## 2021-11-01 NOTE — Telephone Encounter (Signed)
Called the patient and reviewed the EEG results. Advised of the normal finding and no concerns for seizure activity. Advised for now she needs to continue treatment medication and keep follow up appointments.

## 2021-11-13 ENCOUNTER — Telehealth: Payer: Self-pay | Admitting: Neurology

## 2021-11-13 NOTE — Telephone Encounter (Signed)
I called the patient to inquire about [facility] requesting medication refills for the patient. Pt stated her medications must go through [facility] for insurance purposes. These medications will not be sent to [facility].

## 2021-11-13 NOTE — Telephone Encounter (Signed)
Kim (Lexi) request refill for Oxcarbazepine (TRILEPTAL) 300 MG tablet and Ubrogepant (UBRELVY) 100 MG TABS at [facility] [address], [place] Phone: [phone] Fax: [phone]

## 2021-12-05 ENCOUNTER — Other Ambulatory Visit: Payer: Self-pay | Admitting: *Deleted

## 2021-12-05 ENCOUNTER — Telehealth: Payer: Self-pay | Admitting: Neurology

## 2021-12-05 MED ORDER — AIMOVIG 140 MG/ML ~~LOC~~ SOAJ: 140.0000 mg | SUBCUTANEOUS | 0 refills | Status: AC

## 2021-12-05 MED ORDER — OXCARBAZEPINE 300 MG PO TABS: 300.0000 mg | ORAL_TABLET | Freq: Two times a day (BID) | ORAL | 0 refills | Status: AC

## 2021-12-05 MED ORDER — UBRELVY 100 MG PO TABS
ORAL_TABLET | ORAL | 0 refills | Status: AC
Start: 2021-12-05 — End: ?

## 2021-12-05 MED ORDER — TOPIRAMATE 50 MG PO TABS: ORAL_TABLET | ORAL | 0 refills | Status: DC

## 2021-12-05 MED ORDER — CLOBAZAM 10 MG PO TABS: 10.0000 mg | ORAL_TABLET | Freq: Two times a day (BID) | ORAL | 0 refills | Status: DC

## 2021-12-05 NOTE — Telephone Encounter (Signed)
The patient has changed pharmacies several times. To avoid confusion, I have called [facility] (used to be [facility]) and voided our prescriptions with them.   She has an appt 01/07/22. We will confirm that she plans to stay with [facility].  Until then, 90-day prescriptions, with no additional refills, sent in for her to the requested location below (Aimovig, Ubrelvy, Onfi, topiramate, oxcarbazepine).

## 2021-12-05 NOTE — Telephone Encounter (Signed)
Pt states her pharmacy is now [facility] [phone], pt is asking that a prescription be sent to them for all her medications prescribed by Dr [name].  Pt is asking for a call from RN

## 2021-12-17 ENCOUNTER — Telehealth: Payer: Self-pay | Admitting: Neurology

## 2021-12-17 NOTE — Telephone Encounter (Signed)
90-day supply of all the requested refills sent to the pharmacy on 12/05/21.

## 2021-12-17 NOTE — Telephone Encounter (Signed)
Pt request refill for Oxcarbazepine (TRILEPTAL) 300 MG tablet and Ubrogepant (UBRELVY) 100 MG TABS and Erenumab-aooe (AIMOVIG) 140 MG/ML SOAJ and topiramate (TOPAMAX) 50 MG tabletat [facility]

## 2021-12-19 ENCOUNTER — Telehealth: Payer: Self-pay | Admitting: Neurology

## 2021-12-19 NOTE — Telephone Encounter (Signed)
Pt is asking for a call from RN to discuss her request of having a list of all her medications emailed to her husband's email of: [email or web address] Pt states the reason for this request is because she is still missing some of her medications prescribed by either Dr [name] or Judson Roch, NP.  Please call.

## 2021-12-19 NOTE — Telephone Encounter (Signed)
Our office prescribes her Aimovig, Roselyn Meier, topiramate, oxcarbazepine and Onfi. 90-day prescriptions of all these medications sent to [facility] on 12/05/21.  I called the patient and provided this information. She would also like a copy of her med list sent to her home address. Placed in mail today.

## 2021-12-24 NOTE — Telephone Encounter (Signed)
Error

## 2022-01-07 ENCOUNTER — Ambulatory Visit: Payer: Medicare HMO | Admitting: Neurology

## 2022-01-07 ENCOUNTER — Encounter: Payer: Self-pay | Admitting: Neurology

## 2022-01-07 VITALS — BP 114/64 | HR 65 | Ht 66.0 in | Wt 160.0 lb

## 2022-01-07 DIAGNOSIS — Z79899 Other long term (current) drug therapy: Secondary | ICD-10-CM | POA: Diagnosis not present

## 2022-01-07 DIAGNOSIS — G40209 Localization-related (focal) (partial) symptomatic epilepsy and epileptic syndromes with complex partial seizures, not intractable, without status epilepticus: Secondary | ICD-10-CM

## 2022-01-07 DIAGNOSIS — R55 Syncope and collapse: Secondary | ICD-10-CM

## 2022-01-07 DIAGNOSIS — G43009 Migraine without aura, not intractable, without status migrainosus: Secondary | ICD-10-CM | POA: Diagnosis not present
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
## 2022-01-09 DIAGNOSIS — S52122A Displaced fracture of head of left radius, initial encounter for closed fracture: Secondary | ICD-10-CM | POA: Diagnosis not present

## 2022-01-09 DIAGNOSIS — S52124A Nondisplaced fracture of head of right radius, initial encounter for closed fracture: Secondary | ICD-10-CM | POA: Diagnosis not present

## 2022-01-09 DIAGNOSIS — R569 Unspecified convulsions: Secondary | ICD-10-CM | POA: Diagnosis not present

## 2022-01-09 DIAGNOSIS — S42401A Unspecified fracture of lower end of right humerus, initial encounter for closed fracture: Secondary | ICD-10-CM | POA: Diagnosis not present

## 2022-01-09 DIAGNOSIS — M25539 Pain in unspecified wrist: Secondary | ICD-10-CM | POA: Diagnosis not present

## 2022-01-09 DIAGNOSIS — S52125A Nondisplaced fracture of head of left radius, initial encounter for closed fracture: Secondary | ICD-10-CM | POA: Diagnosis not present

## 2022-01-09 DIAGNOSIS — S59901A Unspecified injury of right elbow, initial encounter: Secondary | ICD-10-CM | POA: Diagnosis not present

## 2022-01-09 DIAGNOSIS — M25422 Effusion, left elbow: Secondary | ICD-10-CM | POA: Diagnosis not present

## 2022-01-09 DIAGNOSIS — G40909 Epilepsy, unspecified, not intractable, without status epilepticus: Secondary | ICD-10-CM | POA: Diagnosis not present

## 2022-01-09 DIAGNOSIS — Z79899 Other long term (current) drug therapy: Secondary | ICD-10-CM | POA: Diagnosis not present

## 2022-01-09 DIAGNOSIS — S42402A Unspecified fracture of lower end of left humerus, initial encounter for closed fracture: Secondary | ICD-10-CM | POA: Diagnosis not present

## 2022-01-11 DIAGNOSIS — S52121A Displaced fracture of head of right radius, initial encounter for closed fracture: Secondary | ICD-10-CM | POA: Diagnosis not present

## 2022-01-11 DIAGNOSIS — S52122A Displaced fracture of head of left radius, initial encounter for closed fracture: Secondary | ICD-10-CM | POA: Diagnosis not present

## 2022-01-14 ENCOUNTER — Ambulatory Visit: Payer: Medicare HMO | Admitting: Neurology

## 2022-01-14 DIAGNOSIS — E860 Dehydration: Secondary | ICD-10-CM | POA: Diagnosis not present

## 2022-01-14 DIAGNOSIS — S52122A Displaced fracture of head of left radius, initial encounter for closed fracture: Secondary | ICD-10-CM | POA: Diagnosis not present

## 2022-01-14 DIAGNOSIS — S52121A Displaced fracture of head of right radius, initial encounter for closed fracture: Secondary | ICD-10-CM | POA: Diagnosis not present

## 2022-01-14 DIAGNOSIS — R5383 Other fatigue: Secondary | ICD-10-CM | POA: Diagnosis not present

## 2022-01-14 DIAGNOSIS — G40209 Localization-related (focal) (partial) symptomatic epilepsy and epileptic syndromes with complex partial seizures, not intractable, without status epilepticus: Secondary | ICD-10-CM | POA: Diagnosis not present

## 2022-01-14 DIAGNOSIS — R7301 Impaired fasting glucose: Secondary | ICD-10-CM | POA: Diagnosis not present

## 2022-01-14 DIAGNOSIS — D519 Vitamin B12 deficiency anemia, unspecified: Secondary | ICD-10-CM | POA: Diagnosis not present

## 2022-01-14 DIAGNOSIS — F319 Bipolar disorder, unspecified: Secondary | ICD-10-CM | POA: Diagnosis not present

## 2022-02-01 DIAGNOSIS — F332 Major depressive disorder, recurrent severe without psychotic features: Secondary | ICD-10-CM | POA: Diagnosis not present

## 2022-02-14 DIAGNOSIS — F209 Schizophrenia, unspecified: Secondary | ICD-10-CM | POA: Diagnosis not present

## 2022-02-14 DIAGNOSIS — S79921A Unspecified injury of right thigh, initial encounter: Secondary | ICD-10-CM | POA: Diagnosis not present

## 2022-02-14 DIAGNOSIS — F29 Unspecified psychosis not due to a substance or known physiological condition: Secondary | ICD-10-CM | POA: Diagnosis not present

## 2022-02-14 DIAGNOSIS — Z20822 Contact with and (suspected) exposure to covid-19: Secondary | ICD-10-CM | POA: Diagnosis not present

## 2022-02-14 DIAGNOSIS — R58 Hemorrhage, not elsewhere classified: Secondary | ICD-10-CM | POA: Diagnosis not present

## 2022-02-14 DIAGNOSIS — S71111A Laceration without foreign body, right thigh, initial encounter: Secondary | ICD-10-CM | POA: Diagnosis not present

## 2022-02-14 DIAGNOSIS — S71121A Laceration with foreign body, right thigh, initial encounter: Secondary | ICD-10-CM | POA: Diagnosis not present

## 2022-02-14 DIAGNOSIS — R440 Auditory hallucinations: Secondary | ICD-10-CM | POA: Diagnosis not present

## 2022-02-14 DIAGNOSIS — Z79899 Other long term (current) drug therapy: Secondary | ICD-10-CM | POA: Diagnosis not present

## 2022-02-14 DIAGNOSIS — F32A Depression, unspecified: Secondary | ICD-10-CM | POA: Diagnosis not present

## 2022-02-14 DIAGNOSIS — R001 Bradycardia, unspecified: Secondary | ICD-10-CM | POA: Diagnosis not present

## 2022-02-14 DIAGNOSIS — F319 Bipolar disorder, unspecified: Secondary | ICD-10-CM | POA: Diagnosis not present

## 2022-02-14 DIAGNOSIS — Z23 Encounter for immunization: Secondary | ICD-10-CM | POA: Diagnosis not present

## 2022-02-14 DIAGNOSIS — T1491XA Suicide attempt, initial encounter: Secondary | ICD-10-CM | POA: Diagnosis not present

## 2022-02-15 DIAGNOSIS — T1491XA Suicide attempt, initial encounter: Secondary | ICD-10-CM | POA: Diagnosis not present

## 2022-02-15 DIAGNOSIS — F32A Depression, unspecified: Secondary | ICD-10-CM | POA: Diagnosis not present

## 2022-02-15 DIAGNOSIS — R440 Auditory hallucinations: Secondary | ICD-10-CM | POA: Diagnosis not present

## 2022-02-17 DIAGNOSIS — R440 Auditory hallucinations: Secondary | ICD-10-CM | POA: Diagnosis not present

## 2022-02-17 DIAGNOSIS — F32A Depression, unspecified: Secondary | ICD-10-CM | POA: Diagnosis not present

## 2022-02-17 DIAGNOSIS — T1491XA Suicide attempt, initial encounter: Secondary | ICD-10-CM | POA: Diagnosis not present

## 2022-02-18 DIAGNOSIS — R609 Edema, unspecified: Secondary | ICD-10-CM | POA: Diagnosis not present

## 2022-02-18 DIAGNOSIS — G40909 Epilepsy, unspecified, not intractable, without status epilepticus: Secondary | ICD-10-CM | POA: Diagnosis not present

## 2022-02-18 DIAGNOSIS — S71121A Laceration with foreign body, right thigh, initial encounter: Secondary | ICD-10-CM | POA: Diagnosis not present

## 2022-02-18 DIAGNOSIS — T1491XA Suicide attempt, initial encounter: Secondary | ICD-10-CM | POA: Diagnosis not present

## 2022-02-18 DIAGNOSIS — S71111S Laceration without foreign body, right thigh, sequela: Secondary | ICD-10-CM | POA: Diagnosis not present

## 2022-02-18 DIAGNOSIS — G43909 Migraine, unspecified, not intractable, without status migrainosus: Secondary | ICD-10-CM | POA: Diagnosis not present

## 2022-02-18 DIAGNOSIS — K219 Gastro-esophageal reflux disease without esophagitis: Secondary | ICD-10-CM | POA: Diagnosis not present

## 2022-02-18 DIAGNOSIS — E559 Vitamin D deficiency, unspecified: Secondary | ICD-10-CM | POA: Diagnosis not present

## 2022-02-18 DIAGNOSIS — Z20822 Contact with and (suspected) exposure to covid-19: Secondary | ICD-10-CM | POA: Diagnosis not present

## 2022-02-18 DIAGNOSIS — F32A Depression, unspecified: Secondary | ICD-10-CM | POA: Diagnosis not present

## 2022-02-18 DIAGNOSIS — Z79899 Other long term (current) drug therapy: Secondary | ICD-10-CM | POA: Diagnosis not present

## 2022-02-18 DIAGNOSIS — M62838 Other muscle spasm: Secondary | ICD-10-CM | POA: Diagnosis not present

## 2022-02-18 DIAGNOSIS — R52 Pain, unspecified: Secondary | ICD-10-CM | POA: Diagnosis not present

## 2022-02-18 DIAGNOSIS — F209 Schizophrenia, unspecified: Secondary | ICD-10-CM | POA: Diagnosis not present

## 2022-02-18 DIAGNOSIS — R440 Auditory hallucinations: Secondary | ICD-10-CM | POA: Diagnosis not present

## 2022-02-18 DIAGNOSIS — Z23 Encounter for immunization: Secondary | ICD-10-CM | POA: Diagnosis not present

## 2022-02-18 DIAGNOSIS — E7800 Pure hypercholesterolemia, unspecified: Secondary | ICD-10-CM | POA: Diagnosis not present

## 2022-02-18 DIAGNOSIS — F319 Bipolar disorder, unspecified: Secondary | ICD-10-CM | POA: Diagnosis not present

## 2022-02-18 DIAGNOSIS — F250 Schizoaffective disorder, bipolar type: Secondary | ICD-10-CM | POA: Diagnosis not present

## 2022-02-19 DIAGNOSIS — G40909 Epilepsy, unspecified, not intractable, without status epilepticus: Secondary | ICD-10-CM | POA: Diagnosis not present

## 2022-02-19 DIAGNOSIS — S71111S Laceration without foreign body, right thigh, sequela: Secondary | ICD-10-CM | POA: Diagnosis not present

## 2022-02-19 DIAGNOSIS — G43909 Migraine, unspecified, not intractable, without status migrainosus: Secondary | ICD-10-CM | POA: Diagnosis not present

## 2022-02-19 DIAGNOSIS — E559 Vitamin D deficiency, unspecified: Secondary | ICD-10-CM | POA: Diagnosis not present

## 2022-02-19 DIAGNOSIS — Z79899 Other long term (current) drug therapy: Secondary | ICD-10-CM | POA: Diagnosis not present

## 2022-02-20 DIAGNOSIS — G40909 Epilepsy, unspecified, not intractable, without status epilepticus: Secondary | ICD-10-CM | POA: Diagnosis not present

## 2022-02-20 DIAGNOSIS — F250 Schizoaffective disorder, bipolar type: Secondary | ICD-10-CM | POA: Diagnosis not present

## 2022-02-21 DIAGNOSIS — F250 Schizoaffective disorder, bipolar type: Secondary | ICD-10-CM | POA: Diagnosis not present

## 2022-02-21 DIAGNOSIS — G40909 Epilepsy, unspecified, not intractable, without status epilepticus: Secondary | ICD-10-CM | POA: Diagnosis not present

## 2022-03-06 ENCOUNTER — Other Ambulatory Visit: Payer: Self-pay | Admitting: Neurology

## 2022-03-06 NOTE — Telephone Encounter (Signed)
Verify Drug Registry For Clobazam 10 Mg Tablet Last Filled: 02/06/2022 Quantity: 60 tablets for 30 days Last appointment: 01/07/2022 Next appointment: 07/10/2022

## 2022-03-08 DIAGNOSIS — F209 Schizophrenia, unspecified: Secondary | ICD-10-CM | POA: Diagnosis not present

## 2022-03-08 DIAGNOSIS — F319 Bipolar disorder, unspecified: Secondary | ICD-10-CM | POA: Diagnosis not present

## 2022-03-08 DIAGNOSIS — G8929 Other chronic pain: Secondary | ICD-10-CM | POA: Diagnosis not present

## 2022-03-08 DIAGNOSIS — R440 Auditory hallucinations: Secondary | ICD-10-CM | POA: Diagnosis not present

## 2022-03-08 DIAGNOSIS — T1491XA Suicide attempt, initial encounter: Secondary | ICD-10-CM | POA: Diagnosis not present

## 2022-03-08 DIAGNOSIS — S71111A Laceration without foreign body, right thigh, initial encounter: Secondary | ICD-10-CM | POA: Diagnosis not present

## 2022-03-08 DIAGNOSIS — M542 Cervicalgia: Secondary | ICD-10-CM | POA: Diagnosis not present

## 2022-03-11 ENCOUNTER — Other Ambulatory Visit: Payer: Self-pay | Admitting: Neurology

## 2022-04-01 DIAGNOSIS — M85812 Other specified disorders of bone density and structure, left shoulder: Secondary | ICD-10-CM | POA: Diagnosis not present

## 2022-04-01 DIAGNOSIS — R569 Unspecified convulsions: Secondary | ICD-10-CM | POA: Diagnosis not present

## 2022-04-01 DIAGNOSIS — R918 Other nonspecific abnormal finding of lung field: Secondary | ICD-10-CM | POA: Diagnosis not present

## 2022-04-01 DIAGNOSIS — M2578 Osteophyte, vertebrae: Secondary | ICD-10-CM | POA: Diagnosis not present

## 2022-04-01 DIAGNOSIS — S4992XA Unspecified injury of left shoulder and upper arm, initial encounter: Secondary | ICD-10-CM | POA: Diagnosis not present

## 2022-04-01 DIAGNOSIS — R531 Weakness: Secondary | ICD-10-CM | POA: Diagnosis not present

## 2022-04-01 DIAGNOSIS — R29810 Facial weakness: Secondary | ICD-10-CM | POA: Diagnosis not present

## 2022-04-01 DIAGNOSIS — W19XXXA Unspecified fall, initial encounter: Secondary | ICD-10-CM | POA: Diagnosis not present

## 2022-04-01 DIAGNOSIS — R410 Disorientation, unspecified: Secondary | ICD-10-CM | POA: Diagnosis not present

## 2022-04-01 DIAGNOSIS — S299XXA Unspecified injury of thorax, initial encounter: Secondary | ICD-10-CM | POA: Diagnosis not present

## 2022-04-01 DIAGNOSIS — Z79899 Other long term (current) drug therapy: Secondary | ICD-10-CM | POA: Diagnosis not present

## 2022-04-01 DIAGNOSIS — M47812 Spondylosis without myelopathy or radiculopathy, cervical region: Secondary | ICD-10-CM | POA: Diagnosis not present

## 2022-04-04 DIAGNOSIS — T43592A Poisoning by other antipsychotics and neuroleptics, intentional self-harm, initial encounter: Secondary | ICD-10-CM | POA: Diagnosis not present

## 2022-04-04 DIAGNOSIS — E871 Hypo-osmolality and hyponatremia: Secondary | ICD-10-CM | POA: Diagnosis not present

## 2022-04-04 DIAGNOSIS — T1491XA Suicide attempt, initial encounter: Secondary | ICD-10-CM | POA: Diagnosis not present

## 2022-04-04 DIAGNOSIS — R0602 Shortness of breath: Secondary | ICD-10-CM | POA: Diagnosis not present

## 2022-04-04 DIAGNOSIS — E8809 Other disorders of plasma-protein metabolism, not elsewhere classified: Secondary | ICD-10-CM | POA: Diagnosis not present

## 2022-04-04 DIAGNOSIS — T50902A Poisoning by unspecified drugs, medicaments and biological substances, intentional self-harm, initial encounter: Secondary | ICD-10-CM | POA: Diagnosis not present

## 2022-04-04 DIAGNOSIS — E876 Hypokalemia: Secondary | ICD-10-CM | POA: Diagnosis not present

## 2022-04-04 DIAGNOSIS — J9601 Acute respiratory failure with hypoxia: Secondary | ICD-10-CM | POA: Diagnosis not present

## 2022-04-04 DIAGNOSIS — R000 Tachycardia, unspecified: Secondary | ICD-10-CM | POA: Diagnosis not present

## 2022-04-04 DIAGNOSIS — R9431 Abnormal electrocardiogram [ECG] [EKG]: Secondary | ICD-10-CM | POA: Diagnosis not present

## 2022-04-04 DIAGNOSIS — J9602 Acute respiratory failure with hypercapnia: Secondary | ICD-10-CM | POA: Diagnosis not present

## 2022-04-04 DIAGNOSIS — R45851 Suicidal ideations: Secondary | ICD-10-CM | POA: Diagnosis not present

## 2022-04-04 DIAGNOSIS — I482 Chronic atrial fibrillation, unspecified: Secondary | ICD-10-CM | POA: Diagnosis not present

## 2022-04-04 DIAGNOSIS — Z79899 Other long term (current) drug therapy: Secondary | ICD-10-CM | POA: Diagnosis not present

## 2022-04-04 DIAGNOSIS — I4891 Unspecified atrial fibrillation: Secondary | ICD-10-CM | POA: Diagnosis not present

## 2022-04-04 DIAGNOSIS — J9690 Respiratory failure, unspecified, unspecified whether with hypoxia or hypercapnia: Secondary | ICD-10-CM | POA: Diagnosis not present

## 2022-04-04 DIAGNOSIS — E8729 Other acidosis: Secondary | ICD-10-CM | POA: Diagnosis not present

## 2022-04-04 DIAGNOSIS — I700 Atherosclerosis of aorta: Secondary | ICD-10-CM | POA: Diagnosis not present

## 2022-04-04 DIAGNOSIS — J690 Pneumonitis due to inhalation of food and vomit: Secondary | ICD-10-CM | POA: Diagnosis not present

## 2022-04-04 DIAGNOSIS — R4781 Slurred speech: Secondary | ICD-10-CM | POA: Diagnosis not present

## 2022-04-04 DIAGNOSIS — I214 Non-ST elevation (NSTEMI) myocardial infarction: Secondary | ICD-10-CM | POA: Diagnosis not present

## 2022-04-04 DIAGNOSIS — F05 Delirium due to known physiological condition: Secondary | ICD-10-CM | POA: Diagnosis not present

## 2022-04-04 DIAGNOSIS — J189 Pneumonia, unspecified organism: Secondary | ICD-10-CM | POA: Diagnosis not present

## 2022-04-04 DIAGNOSIS — T68XXXA Hypothermia, initial encounter: Secondary | ICD-10-CM | POA: Diagnosis not present

## 2022-04-04 DIAGNOSIS — D72829 Elevated white blood cell count, unspecified: Secondary | ICD-10-CM | POA: Diagnosis not present

## 2022-04-04 DIAGNOSIS — J80 Acute respiratory distress syndrome: Secondary | ICD-10-CM | POA: Diagnosis not present

## 2022-04-04 DIAGNOSIS — Z8669 Personal history of other diseases of the nervous system and sense organs: Secondary | ICD-10-CM | POA: Diagnosis not present

## 2022-04-04 DIAGNOSIS — Z4682 Encounter for fitting and adjustment of non-vascular catheter: Secondary | ICD-10-CM | POA: Diagnosis not present

## 2022-04-04 DIAGNOSIS — I5020 Unspecified systolic (congestive) heart failure: Secondary | ICD-10-CM | POA: Diagnosis not present

## 2022-04-04 DIAGNOSIS — I480 Paroxysmal atrial fibrillation: Secondary | ICD-10-CM | POA: Diagnosis not present

## 2022-04-04 DIAGNOSIS — R918 Other nonspecific abnormal finding of lung field: Secondary | ICD-10-CM | POA: Diagnosis not present

## 2022-04-04 DIAGNOSIS — R079 Chest pain, unspecified: Secondary | ICD-10-CM | POA: Diagnosis not present

## 2022-04-04 DIAGNOSIS — R945 Abnormal results of liver function studies: Secondary | ICD-10-CM | POA: Diagnosis not present

## 2022-04-04 DIAGNOSIS — J15211 Pneumonia due to Methicillin susceptible Staphylococcus aureus: Secondary | ICD-10-CM | POA: Diagnosis not present

## 2022-04-04 DIAGNOSIS — G928 Other toxic encephalopathy: Secondary | ICD-10-CM | POA: Diagnosis not present

## 2022-04-04 DIAGNOSIS — J90 Pleural effusion, not elsewhere classified: Secondary | ICD-10-CM | POA: Diagnosis not present

## 2022-04-04 DIAGNOSIS — Z452 Encounter for adjustment and management of vascular access device: Secondary | ICD-10-CM | POA: Diagnosis not present

## 2022-04-04 DIAGNOSIS — I429 Cardiomyopathy, unspecified: Secondary | ICD-10-CM | POA: Diagnosis not present

## 2022-04-04 DIAGNOSIS — R5383 Other fatigue: Secondary | ICD-10-CM | POA: Diagnosis not present

## 2022-04-04 DIAGNOSIS — Z9911 Dependence on respirator [ventilator] status: Secondary | ICD-10-CM | POA: Diagnosis not present

## 2022-04-04 DIAGNOSIS — J984 Other disorders of lung: Secondary | ICD-10-CM | POA: Diagnosis not present

## 2022-04-04 DIAGNOSIS — A419 Sepsis, unspecified organism: Secondary | ICD-10-CM | POA: Diagnosis not present

## 2022-04-04 DIAGNOSIS — T50901A Poisoning by unspecified drugs, medicaments and biological substances, accidental (unintentional), initial encounter: Secondary | ICD-10-CM | POA: Diagnosis not present

## 2022-04-04 DIAGNOSIS — R509 Fever, unspecified: Secondary | ICD-10-CM | POA: Diagnosis not present

## 2022-04-05 DIAGNOSIS — J690 Pneumonitis due to inhalation of food and vomit: Secondary | ICD-10-CM | POA: Diagnosis not present

## 2022-04-05 DIAGNOSIS — D72829 Elevated white blood cell count, unspecified: Secondary | ICD-10-CM | POA: Diagnosis not present

## 2022-04-05 DIAGNOSIS — R918 Other nonspecific abnormal finding of lung field: Secondary | ICD-10-CM | POA: Diagnosis not present

## 2022-04-05 DIAGNOSIS — R9431 Abnormal electrocardiogram [ECG] [EKG]: Secondary | ICD-10-CM

## 2022-04-05 DIAGNOSIS — J189 Pneumonia, unspecified organism: Secondary | ICD-10-CM | POA: Diagnosis not present

## 2022-04-05 DIAGNOSIS — G928 Other toxic encephalopathy: Secondary | ICD-10-CM | POA: Diagnosis not present

## 2022-04-05 DIAGNOSIS — T43592A Poisoning by other antipsychotics and neuroleptics, intentional self-harm, initial encounter: Secondary | ICD-10-CM | POA: Diagnosis not present

## 2022-04-05 DIAGNOSIS — A419 Sepsis, unspecified organism: Secondary | ICD-10-CM | POA: Diagnosis not present

## 2022-04-06 DIAGNOSIS — R9431 Abnormal electrocardiogram [ECG] [EKG]: Secondary | ICD-10-CM | POA: Diagnosis not present

## 2022-04-06 DIAGNOSIS — T43592A Poisoning by other antipsychotics and neuroleptics, intentional self-harm, initial encounter: Secondary | ICD-10-CM | POA: Diagnosis not present

## 2022-04-06 DIAGNOSIS — A419 Sepsis, unspecified organism: Secondary | ICD-10-CM | POA: Diagnosis not present

## 2022-04-06 DIAGNOSIS — G928 Other toxic encephalopathy: Secondary | ICD-10-CM | POA: Diagnosis not present

## 2022-04-07 DIAGNOSIS — R945 Abnormal results of liver function studies: Secondary | ICD-10-CM | POA: Diagnosis not present

## 2022-04-07 DIAGNOSIS — G928 Other toxic encephalopathy: Secondary | ICD-10-CM | POA: Diagnosis not present

## 2022-04-07 DIAGNOSIS — A419 Sepsis, unspecified organism: Secondary | ICD-10-CM | POA: Diagnosis not present

## 2022-04-07 DIAGNOSIS — R9431 Abnormal electrocardiogram [ECG] [EKG]: Secondary | ICD-10-CM | POA: Diagnosis not present

## 2022-04-07 DIAGNOSIS — T43592A Poisoning by other antipsychotics and neuroleptics, intentional self-harm, initial encounter: Secondary | ICD-10-CM | POA: Diagnosis not present

## 2022-04-08 DIAGNOSIS — A419 Sepsis, unspecified organism: Secondary | ICD-10-CM | POA: Diagnosis not present

## 2022-04-08 DIAGNOSIS — I480 Paroxysmal atrial fibrillation: Secondary | ICD-10-CM | POA: Diagnosis not present

## 2022-04-08 DIAGNOSIS — J189 Pneumonia, unspecified organism: Secondary | ICD-10-CM | POA: Diagnosis not present

## 2022-04-08 DIAGNOSIS — T43592A Poisoning by other antipsychotics and neuroleptics, intentional self-harm, initial encounter: Secondary | ICD-10-CM | POA: Diagnosis not present

## 2022-04-08 DIAGNOSIS — J690 Pneumonitis due to inhalation of food and vomit: Secondary | ICD-10-CM | POA: Diagnosis not present

## 2022-04-08 DIAGNOSIS — G928 Other toxic encephalopathy: Secondary | ICD-10-CM | POA: Diagnosis not present

## 2022-04-09 DIAGNOSIS — T50901A Poisoning by unspecified drugs, medicaments and biological substances, accidental (unintentional), initial encounter: Secondary | ICD-10-CM | POA: Diagnosis not present

## 2022-04-09 DIAGNOSIS — R9431 Abnormal electrocardiogram [ECG] [EKG]: Secondary | ICD-10-CM | POA: Diagnosis not present

## 2022-04-09 DIAGNOSIS — G928 Other toxic encephalopathy: Secondary | ICD-10-CM | POA: Diagnosis not present

## 2022-04-09 DIAGNOSIS — Z8669 Personal history of other diseases of the nervous system and sense organs: Secondary | ICD-10-CM | POA: Diagnosis not present

## 2022-04-09 DIAGNOSIS — I480 Paroxysmal atrial fibrillation: Secondary | ICD-10-CM | POA: Diagnosis not present

## 2022-04-09 DIAGNOSIS — T43592A Poisoning by other antipsychotics and neuroleptics, intentional self-harm, initial encounter: Secondary | ICD-10-CM | POA: Diagnosis not present

## 2022-04-09 DIAGNOSIS — A419 Sepsis, unspecified organism: Secondary | ICD-10-CM | POA: Diagnosis not present

## 2022-04-10 DIAGNOSIS — G928 Other toxic encephalopathy: Secondary | ICD-10-CM | POA: Diagnosis not present

## 2022-04-10 DIAGNOSIS — I480 Paroxysmal atrial fibrillation: Secondary | ICD-10-CM | POA: Diagnosis not present

## 2022-04-10 DIAGNOSIS — Z8669 Personal history of other diseases of the nervous system and sense organs: Secondary | ICD-10-CM | POA: Diagnosis not present

## 2022-04-10 DIAGNOSIS — T43592A Poisoning by other antipsychotics and neuroleptics, intentional self-harm, initial encounter: Secondary | ICD-10-CM | POA: Diagnosis not present

## 2022-04-10 DIAGNOSIS — T50901A Poisoning by unspecified drugs, medicaments and biological substances, accidental (unintentional), initial encounter: Secondary | ICD-10-CM | POA: Diagnosis not present

## 2022-04-10 DIAGNOSIS — R9431 Abnormal electrocardiogram [ECG] [EKG]: Secondary | ICD-10-CM | POA: Diagnosis not present

## 2022-04-10 DIAGNOSIS — A419 Sepsis, unspecified organism: Secondary | ICD-10-CM | POA: Diagnosis not present

## 2022-04-11 DIAGNOSIS — J9601 Acute respiratory failure with hypoxia: Secondary | ICD-10-CM | POA: Diagnosis not present

## 2022-04-11 DIAGNOSIS — A419 Sepsis, unspecified organism: Secondary | ICD-10-CM | POA: Diagnosis not present

## 2022-04-11 DIAGNOSIS — R0602 Shortness of breath: Secondary | ICD-10-CM | POA: Diagnosis not present

## 2022-04-11 DIAGNOSIS — G928 Other toxic encephalopathy: Secondary | ICD-10-CM | POA: Diagnosis not present

## 2022-04-11 DIAGNOSIS — Z8669 Personal history of other diseases of the nervous system and sense organs: Secondary | ICD-10-CM | POA: Diagnosis not present

## 2022-04-11 DIAGNOSIS — I480 Paroxysmal atrial fibrillation: Secondary | ICD-10-CM | POA: Diagnosis not present

## 2022-04-11 DIAGNOSIS — J90 Pleural effusion, not elsewhere classified: Secondary | ICD-10-CM | POA: Diagnosis not present

## 2022-04-11 DIAGNOSIS — R079 Chest pain, unspecified: Secondary | ICD-10-CM | POA: Diagnosis not present

## 2022-04-11 DIAGNOSIS — T50901A Poisoning by unspecified drugs, medicaments and biological substances, accidental (unintentional), initial encounter: Secondary | ICD-10-CM | POA: Diagnosis not present

## 2022-04-11 DIAGNOSIS — T43592A Poisoning by other antipsychotics and neuroleptics, intentional self-harm, initial encounter: Secondary | ICD-10-CM | POA: Diagnosis not present

## 2022-04-11 DIAGNOSIS — R000 Tachycardia, unspecified: Secondary | ICD-10-CM | POA: Diagnosis not present

## 2022-04-11 DIAGNOSIS — R9431 Abnormal electrocardiogram [ECG] [EKG]: Secondary | ICD-10-CM | POA: Diagnosis not present

## 2022-04-12 DIAGNOSIS — R000 Tachycardia, unspecified: Secondary | ICD-10-CM | POA: Diagnosis not present

## 2022-04-12 DIAGNOSIS — J9690 Respiratory failure, unspecified, unspecified whether with hypoxia or hypercapnia: Secondary | ICD-10-CM | POA: Diagnosis not present

## 2022-04-12 DIAGNOSIS — I4891 Unspecified atrial fibrillation: Secondary | ICD-10-CM | POA: Diagnosis not present

## 2022-04-12 DIAGNOSIS — R9431 Abnormal electrocardiogram [ECG] [EKG]: Secondary | ICD-10-CM | POA: Diagnosis not present

## 2022-04-12 DIAGNOSIS — T43592A Poisoning by other antipsychotics and neuroleptics, intentional self-harm, initial encounter: Secondary | ICD-10-CM | POA: Diagnosis not present

## 2022-04-12 DIAGNOSIS — J690 Pneumonitis due to inhalation of food and vomit: Secondary | ICD-10-CM | POA: Diagnosis not present

## 2022-04-12 DIAGNOSIS — A419 Sepsis, unspecified organism: Secondary | ICD-10-CM | POA: Diagnosis not present

## 2022-04-12 DIAGNOSIS — E876 Hypokalemia: Secondary | ICD-10-CM | POA: Diagnosis not present

## 2022-04-12 DIAGNOSIS — G928 Other toxic encephalopathy: Secondary | ICD-10-CM | POA: Diagnosis not present

## 2022-04-12 DIAGNOSIS — R509 Fever, unspecified: Secondary | ICD-10-CM

## 2022-04-12 DIAGNOSIS — J80 Acute respiratory distress syndrome: Secondary | ICD-10-CM | POA: Diagnosis not present

## 2022-04-12 DIAGNOSIS — I480 Paroxysmal atrial fibrillation: Secondary | ICD-10-CM | POA: Diagnosis not present

## 2022-04-12 DIAGNOSIS — I429 Cardiomyopathy, unspecified: Secondary | ICD-10-CM | POA: Diagnosis not present

## 2022-04-12 DIAGNOSIS — J9601 Acute respiratory failure with hypoxia: Secondary | ICD-10-CM | POA: Diagnosis not present

## 2022-04-12 DIAGNOSIS — Z9911 Dependence on respirator [ventilator] status: Secondary | ICD-10-CM | POA: Diagnosis not present

## 2022-04-13 DIAGNOSIS — G928 Other toxic encephalopathy: Secondary | ICD-10-CM | POA: Diagnosis not present

## 2022-04-13 DIAGNOSIS — J690 Pneumonitis due to inhalation of food and vomit: Secondary | ICD-10-CM

## 2022-04-13 DIAGNOSIS — R5383 Other fatigue: Secondary | ICD-10-CM | POA: Diagnosis not present

## 2022-04-13 DIAGNOSIS — I429 Cardiomyopathy, unspecified: Secondary | ICD-10-CM

## 2022-04-13 DIAGNOSIS — R9431 Abnormal electrocardiogram [ECG] [EKG]: Secondary | ICD-10-CM

## 2022-04-13 DIAGNOSIS — T43592A Poisoning by other antipsychotics and neuroleptics, intentional self-harm, initial encounter: Secondary | ICD-10-CM | POA: Diagnosis not present

## 2022-04-13 DIAGNOSIS — A419 Sepsis, unspecified organism: Secondary | ICD-10-CM | POA: Diagnosis not present

## 2022-04-13 DIAGNOSIS — I700 Atherosclerosis of aorta: Secondary | ICD-10-CM | POA: Diagnosis not present

## 2022-04-13 DIAGNOSIS — T50901A Poisoning by unspecified drugs, medicaments and biological substances, accidental (unintentional), initial encounter: Secondary | ICD-10-CM | POA: Diagnosis not present

## 2022-04-13 DIAGNOSIS — T1491XA Suicide attempt, initial encounter: Secondary | ICD-10-CM

## 2022-04-14 DIAGNOSIS — T43592A Poisoning by other antipsychotics and neuroleptics, intentional self-harm, initial encounter: Secondary | ICD-10-CM | POA: Diagnosis not present

## 2022-04-14 DIAGNOSIS — J9601 Acute respiratory failure with hypoxia: Secondary | ICD-10-CM | POA: Diagnosis not present

## 2022-04-14 DIAGNOSIS — T50901A Poisoning by unspecified drugs, medicaments and biological substances, accidental (unintentional), initial encounter: Secondary | ICD-10-CM | POA: Diagnosis not present

## 2022-04-14 DIAGNOSIS — J690 Pneumonitis due to inhalation of food and vomit: Secondary | ICD-10-CM | POA: Diagnosis not present

## 2022-04-14 DIAGNOSIS — J80 Acute respiratory distress syndrome: Secondary | ICD-10-CM | POA: Diagnosis not present

## 2022-04-14 DIAGNOSIS — I4891 Unspecified atrial fibrillation: Secondary | ICD-10-CM | POA: Diagnosis not present

## 2022-04-14 DIAGNOSIS — R9431 Abnormal electrocardiogram [ECG] [EKG]: Secondary | ICD-10-CM | POA: Diagnosis not present

## 2022-04-14 DIAGNOSIS — A419 Sepsis, unspecified organism: Secondary | ICD-10-CM | POA: Diagnosis not present

## 2022-04-14 DIAGNOSIS — I429 Cardiomyopathy, unspecified: Secondary | ICD-10-CM | POA: Diagnosis not present

## 2022-04-14 DIAGNOSIS — Z9911 Dependence on respirator [ventilator] status: Secondary | ICD-10-CM | POA: Diagnosis not present

## 2022-04-14 DIAGNOSIS — G928 Other toxic encephalopathy: Secondary | ICD-10-CM | POA: Diagnosis not present

## 2022-04-15 DIAGNOSIS — Z452 Encounter for adjustment and management of vascular access device: Secondary | ICD-10-CM | POA: Diagnosis not present

## 2022-04-15 DIAGNOSIS — Z9911 Dependence on respirator [ventilator] status: Secondary | ICD-10-CM | POA: Diagnosis not present

## 2022-04-15 DIAGNOSIS — J9601 Acute respiratory failure with hypoxia: Secondary | ICD-10-CM | POA: Diagnosis not present

## 2022-04-15 DIAGNOSIS — T50901A Poisoning by unspecified drugs, medicaments and biological substances, accidental (unintentional), initial encounter: Secondary | ICD-10-CM | POA: Diagnosis not present

## 2022-04-15 DIAGNOSIS — I429 Cardiomyopathy, unspecified: Secondary | ICD-10-CM | POA: Diagnosis not present

## 2022-04-15 DIAGNOSIS — J690 Pneumonitis due to inhalation of food and vomit: Secondary | ICD-10-CM | POA: Diagnosis not present

## 2022-04-15 DIAGNOSIS — G928 Other toxic encephalopathy: Secondary | ICD-10-CM | POA: Diagnosis not present

## 2022-04-15 DIAGNOSIS — R9431 Abnormal electrocardiogram [ECG] [EKG]: Secondary | ICD-10-CM | POA: Diagnosis not present

## 2022-04-15 DIAGNOSIS — I4891 Unspecified atrial fibrillation: Secondary | ICD-10-CM | POA: Diagnosis not present

## 2022-04-15 DIAGNOSIS — T43592A Poisoning by other antipsychotics and neuroleptics, intentional self-harm, initial encounter: Secondary | ICD-10-CM | POA: Diagnosis not present

## 2022-04-15 DIAGNOSIS — R918 Other nonspecific abnormal finding of lung field: Secondary | ICD-10-CM | POA: Diagnosis not present

## 2022-04-15 DIAGNOSIS — A419 Sepsis, unspecified organism: Secondary | ICD-10-CM | POA: Diagnosis not present

## 2022-04-15 DIAGNOSIS — J80 Acute respiratory distress syndrome: Secondary | ICD-10-CM | POA: Diagnosis not present

## 2022-04-15 DIAGNOSIS — Z4682 Encounter for fitting and adjustment of non-vascular catheter: Secondary | ICD-10-CM | POA: Diagnosis not present

## 2022-04-16 DIAGNOSIS — R9431 Abnormal electrocardiogram [ECG] [EKG]: Secondary | ICD-10-CM | POA: Diagnosis not present

## 2022-04-16 DIAGNOSIS — J9690 Respiratory failure, unspecified, unspecified whether with hypoxia or hypercapnia: Secondary | ICD-10-CM | POA: Diagnosis not present

## 2022-04-16 DIAGNOSIS — Z9911 Dependence on respirator [ventilator] status: Secondary | ICD-10-CM | POA: Diagnosis not present

## 2022-04-16 DIAGNOSIS — J189 Pneumonia, unspecified organism: Secondary | ICD-10-CM | POA: Diagnosis not present

## 2022-04-16 DIAGNOSIS — T50902A Poisoning by unspecified drugs, medicaments and biological substances, intentional self-harm, initial encounter: Secondary | ICD-10-CM | POA: Diagnosis not present

## 2022-04-16 DIAGNOSIS — J690 Pneumonitis due to inhalation of food and vomit: Secondary | ICD-10-CM | POA: Diagnosis not present

## 2022-04-16 DIAGNOSIS — J80 Acute respiratory distress syndrome: Secondary | ICD-10-CM | POA: Diagnosis not present

## 2022-04-16 DIAGNOSIS — I359 Nonrheumatic aortic valve disorder, unspecified: Secondary | ICD-10-CM | POA: Diagnosis not present

## 2022-04-16 DIAGNOSIS — T50901A Poisoning by unspecified drugs, medicaments and biological substances, accidental (unintentional), initial encounter: Secondary | ICD-10-CM | POA: Diagnosis not present

## 2022-04-16 DIAGNOSIS — G928 Other toxic encephalopathy: Secondary | ICD-10-CM | POA: Diagnosis not present

## 2022-04-16 DIAGNOSIS — I482 Chronic atrial fibrillation, unspecified: Secondary | ICD-10-CM | POA: Diagnosis not present

## 2022-04-16 DIAGNOSIS — J984 Other disorders of lung: Secondary | ICD-10-CM | POA: Diagnosis not present

## 2022-04-16 DIAGNOSIS — G9340 Encephalopathy, unspecified: Secondary | ICD-10-CM | POA: Diagnosis not present

## 2022-04-16 DIAGNOSIS — I429 Cardiomyopathy, unspecified: Secondary | ICD-10-CM | POA: Diagnosis not present

## 2022-04-16 DIAGNOSIS — R918 Other nonspecific abnormal finding of lung field: Secondary | ICD-10-CM | POA: Diagnosis not present

## 2022-04-16 DIAGNOSIS — J9601 Acute respiratory failure with hypoxia: Secondary | ICD-10-CM | POA: Diagnosis not present

## 2022-04-16 DIAGNOSIS — T43592A Poisoning by other antipsychotics and neuroleptics, intentional self-harm, initial encounter: Secondary | ICD-10-CM | POA: Diagnosis not present

## 2022-04-16 DIAGNOSIS — Z0189 Encounter for other specified special examinations: Secondary | ICD-10-CM | POA: Diagnosis not present

## 2022-04-16 DIAGNOSIS — J9602 Acute respiratory failure with hypercapnia: Secondary | ICD-10-CM | POA: Diagnosis not present

## 2022-04-16 DIAGNOSIS — R0603 Acute respiratory distress: Secondary | ICD-10-CM | POA: Diagnosis not present

## 2022-04-16 DIAGNOSIS — R52 Pain, unspecified: Secondary | ICD-10-CM | POA: Diagnosis not present

## 2022-04-16 DIAGNOSIS — R451 Restlessness and agitation: Secondary | ICD-10-CM | POA: Diagnosis not present

## 2022-04-16 DIAGNOSIS — N119 Chronic tubulo-interstitial nephritis, unspecified: Secondary | ICD-10-CM | POA: Diagnosis not present

## 2022-04-16 DIAGNOSIS — E871 Hypo-osmolality and hyponatremia: Secondary | ICD-10-CM | POA: Diagnosis not present

## 2022-04-16 DIAGNOSIS — I5020 Unspecified systolic (congestive) heart failure: Secondary | ICD-10-CM | POA: Diagnosis not present

## 2022-04-16 DIAGNOSIS — A419 Sepsis, unspecified organism: Secondary | ICD-10-CM | POA: Diagnosis not present

## 2022-04-16 DIAGNOSIS — I214 Non-ST elevation (NSTEMI) myocardial infarction: Secondary | ICD-10-CM | POA: Diagnosis not present

## 2022-04-16 DIAGNOSIS — F05 Delirium due to known physiological condition: Secondary | ICD-10-CM | POA: Diagnosis not present

## 2022-04-16 DIAGNOSIS — Z4682 Encounter for fitting and adjustment of non-vascular catheter: Secondary | ICD-10-CM | POA: Diagnosis not present

## 2022-04-16 DIAGNOSIS — Z515 Encounter for palliative care: Secondary | ICD-10-CM | POA: Diagnosis not present

## 2022-04-16 DIAGNOSIS — Z7189 Other specified counseling: Secondary | ICD-10-CM | POA: Diagnosis not present

## 2022-04-16 DIAGNOSIS — J90 Pleural effusion, not elsewhere classified: Secondary | ICD-10-CM | POA: Diagnosis not present

## 2022-04-16 DIAGNOSIS — R110 Nausea: Secondary | ICD-10-CM | POA: Diagnosis not present

## 2022-04-16 DIAGNOSIS — R7989 Other specified abnormal findings of blood chemistry: Secondary | ICD-10-CM | POA: Diagnosis not present

## 2022-04-16 DIAGNOSIS — R4182 Altered mental status, unspecified: Secondary | ICD-10-CM | POA: Diagnosis not present

## 2022-04-16 DIAGNOSIS — I5189 Other ill-defined heart diseases: Secondary | ICD-10-CM | POA: Diagnosis not present

## 2022-04-16 DIAGNOSIS — J15211 Pneumonia due to Methicillin susceptible Staphylococcus aureus: Secondary | ICD-10-CM | POA: Diagnosis not present

## 2022-04-16 DIAGNOSIS — E8809 Other disorders of plasma-protein metabolism, not elsewhere classified: Secondary | ICD-10-CM | POA: Diagnosis not present

## 2022-04-16 DIAGNOSIS — E876 Hypokalemia: Secondary | ICD-10-CM | POA: Diagnosis not present

## 2022-04-16 DIAGNOSIS — Z930 Tracheostomy status: Secondary | ICD-10-CM | POA: Diagnosis not present

## 2022-04-16 DIAGNOSIS — R600 Localized edema: Secondary | ICD-10-CM | POA: Diagnosis not present

## 2022-04-16 DIAGNOSIS — K5900 Constipation, unspecified: Secondary | ICD-10-CM | POA: Diagnosis not present

## 2022-04-16 DIAGNOSIS — I82612 Acute embolism and thrombosis of superficial veins of left upper extremity: Secondary | ICD-10-CM | POA: Diagnosis not present

## 2022-04-30 DIAGNOSIS — N119 Chronic tubulo-interstitial nephritis, unspecified: Secondary | ICD-10-CM | POA: Diagnosis not present

## 2022-05-17 DEATH — deceased

## 2022-06-05 ENCOUNTER — Telehealth: Payer: Self-pay | Admitting: Neurology

## 2022-06-05 NOTE — Telephone Encounter (Signed)
Received a PA request for the patient and saw where the patient has passed away. I will bring to Dr Rhea Belton attention as well as her team.

## 2022-07-10 ENCOUNTER — Telehealth: Payer: Medicare HMO | Admitting: Neurology
